# Patient Record
Sex: Female | Born: 1955 | Race: Black or African American | Hispanic: No | Marital: Married | Smoking: Never smoker
Health system: Southern US, Community
[De-identification: ages and names within clinical notes are randomized; demographics above are authoritative.]

## PROBLEM LIST (undated history)

## (undated) DIAGNOSIS — G709 Myoneural disorder, unspecified: Secondary | ICD-10-CM

## (undated) DIAGNOSIS — E785 Hyperlipidemia, unspecified: Secondary | ICD-10-CM

## (undated) DIAGNOSIS — T4145XA Adverse effect of unspecified anesthetic, initial encounter: Secondary | ICD-10-CM

## (undated) DIAGNOSIS — B009 Herpesviral infection, unspecified: Secondary | ICD-10-CM

## (undated) DIAGNOSIS — T8859XA Other complications of anesthesia, initial encounter: Secondary | ICD-10-CM

## (undated) DIAGNOSIS — E559 Vitamin D deficiency, unspecified: Secondary | ICD-10-CM

## (undated) DIAGNOSIS — G4730 Sleep apnea, unspecified: Secondary | ICD-10-CM

## (undated) DIAGNOSIS — E119 Type 2 diabetes mellitus without complications: Secondary | ICD-10-CM

## (undated) DIAGNOSIS — I10 Essential (primary) hypertension: Secondary | ICD-10-CM

## (undated) DIAGNOSIS — T753XXA Motion sickness, initial encounter: Secondary | ICD-10-CM

## (undated) DIAGNOSIS — G459 Transient cerebral ischemic attack, unspecified: Secondary | ICD-10-CM

## (undated) DIAGNOSIS — M1990 Unspecified osteoarthritis, unspecified site: Secondary | ICD-10-CM

## (undated) HISTORY — DX: Hyperlipidemia, unspecified: E78.5

## (undated) HISTORY — PX: KNEE LIGAMENT RECONSTRUCTION: SHX1895

## (undated) HISTORY — DX: Herpesviral infection, unspecified: B00.9

## (undated) HISTORY — DX: Myoneural disorder, unspecified: G70.9

## (undated) HISTORY — DX: Type 2 diabetes mellitus without complications: E11.9

## (undated) HISTORY — DX: Sleep apnea, unspecified: G47.30

## (undated) HISTORY — DX: Essential (primary) hypertension: I10

## (undated) HISTORY — DX: Vitamin D deficiency, unspecified: E55.9

---

## 1988-12-29 DIAGNOSIS — A601 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: Secondary | ICD-10-CM | POA: Insufficient documentation

## 1999-12-30 HISTORY — PX: ABDOMINAL HYSTERECTOMY: SHX81

## 2004-04-02 LAB — HM DEXA SCAN: HM Dexa Scan: NORMAL

## 2005-06-30 ENCOUNTER — Other Ambulatory Visit: Payer: Self-pay

## 2005-07-07 ENCOUNTER — Ambulatory Visit: Payer: Self-pay | Admitting: Specialist

## 2007-12-09 DIAGNOSIS — G4700 Insomnia, unspecified: Secondary | ICD-10-CM | POA: Insufficient documentation

## 2007-12-30 HISTORY — PX: FOOT SURGERY: SHX648

## 2008-01-31 ENCOUNTER — Ambulatory Visit: Payer: Self-pay

## 2008-11-07 ENCOUNTER — Emergency Department: Payer: Self-pay | Admitting: Emergency Medicine

## 2008-11-11 ENCOUNTER — Emergency Department: Payer: Self-pay | Admitting: Unknown Physician Specialty

## 2008-11-11 ENCOUNTER — Emergency Department: Payer: Self-pay | Admitting: Internal Medicine

## 2008-11-13 ENCOUNTER — Ambulatory Visit: Payer: Self-pay | Admitting: Specialist

## 2008-11-15 ENCOUNTER — Ambulatory Visit: Payer: Self-pay | Admitting: Specialist

## 2008-11-16 ENCOUNTER — Inpatient Hospital Stay: Payer: Self-pay | Admitting: Specialist

## 2009-03-13 ENCOUNTER — Ambulatory Visit: Payer: Self-pay

## 2009-03-21 LAB — HM PAP SMEAR: HM Pap smear: NEGATIVE

## 2010-04-26 ENCOUNTER — Ambulatory Visit: Payer: Self-pay | Admitting: Specialist

## 2010-04-29 ENCOUNTER — Ambulatory Visit: Payer: Self-pay

## 2010-05-01 ENCOUNTER — Ambulatory Visit: Payer: Self-pay | Admitting: Specialist

## 2011-06-09 LAB — TSH: TSH: 1.44 u[IU]/mL (ref <=5.90)

## 2011-06-09 LAB — CBC AND DIFFERENTIAL
HEMATOCRIT: 39 % (ref 36–46)
Hemoglobin: 13 g/dL (ref 12.0–16.0)
PLATELETS: 288 10*3/uL (ref 150–399)
WBC: 8.4 10^3/mL

## 2011-07-18 ENCOUNTER — Ambulatory Visit: Payer: Self-pay | Admitting: Family Medicine

## 2012-04-07 ENCOUNTER — Emergency Department: Payer: Self-pay | Admitting: Emergency Medicine

## 2012-04-07 LAB — BASIC METABOLIC PANEL
Calcium, Total: 9.5 mg/dL (ref 8.5–10.1)
Chloride: 104 mmol/L (ref 98–107)
Co2: 27 mmol/L (ref 21–32)
Creatinine: 0.66 mg/dL (ref 0.60–1.30)
EGFR (African American): >60
EGFR (Non-African Amer.): >60
Glucose: 82 mg/dL (ref 65–99)
Potassium: 4.5 mmol/L (ref 3.5–5.1)
Sodium: 140 mmol/L (ref 136–145)

## 2012-04-07 LAB — CBC
HCT: 39.8 % (ref 35.0–47.0)
HGB: 13.3 g/dL (ref 12.0–16.0)
MCV: 90 fL (ref 80–100)

## 2012-04-07 LAB — CK: CK, Total: 175 U/L (ref 21–215)

## 2012-04-07 LAB — TROPONIN I: Troponin-I: <0.02 ng/mL

## 2012-04-07 LAB — PROTIME-INR
INR: 0.9
Prothrombin Time: 12.7 secs (ref 11.5–14.7)

## 2012-05-03 DIAGNOSIS — M216X9 Other acquired deformities of unspecified foot: Secondary | ICD-10-CM | POA: Insufficient documentation

## 2012-05-03 DIAGNOSIS — M129 Arthropathy, unspecified: Secondary | ICD-10-CM | POA: Insufficient documentation

## 2012-05-12 ENCOUNTER — Ambulatory Visit: Payer: Self-pay | Admitting: Orthopaedic Surgery

## 2012-07-06 LAB — HEPATIC FUNCTION PANEL
ALT: 23 U/L (ref 7–35)
AST: 20 U/L (ref 13–35)

## 2012-07-06 LAB — BASIC METABOLIC PANEL
BUN: 15 mg/dL (ref 4–21)
CREATININE: 0.8 mg/dL (ref <=1.1)
Glucose: 91 mg/dL
Potassium: 4.1 mmol/L (ref 3.4–5.3)
SODIUM: 141 mmol/L (ref 137–147)

## 2012-08-12 ENCOUNTER — Ambulatory Visit: Payer: Self-pay | Admitting: Family Medicine

## 2012-12-13 ENCOUNTER — Ambulatory Visit: Payer: Self-pay

## 2012-12-29 ENCOUNTER — Ambulatory Visit: Payer: Self-pay

## 2013-02-04 LAB — LIPID PANEL
Cholesterol: 256 mg/dL — AB (ref 0–200)
HDL: 68 mg/dL (ref 35–70)
LDL CALC: 167 mg/dL
Triglycerides: 103 mg/dL (ref 40–160)

## 2013-02-04 LAB — HEMOGLOBIN A1C: Hgb A1c MFr Bld: 5.8 % (ref 4.0–6.0)

## 2013-09-01 ENCOUNTER — Ambulatory Visit: Payer: Self-pay | Admitting: Family Medicine

## 2013-10-13 ENCOUNTER — Ambulatory Visit: Payer: Self-pay | Admitting: Family Medicine

## 2014-07-25 ENCOUNTER — Emergency Department: Payer: Self-pay | Admitting: Internal Medicine

## 2014-07-25 LAB — URINALYSIS, COMPLETE
BILIRUBIN, UR: NEGATIVE
Blood: NEGATIVE
Glucose,UR: NEGATIVE mg/dL (ref 0–75)
Ketone: NEGATIVE
Leukocyte Esterase: NEGATIVE
Nitrite: NEGATIVE
Ph: 6 (ref 4.5–8.0)
Protein: NEGATIVE
SPECIFIC GRAVITY: 1.002 (ref 1.003–1.030)
WBC UR: <1 /HPF (ref 0–5)

## 2014-07-25 LAB — CBC WITH DIFFERENTIAL/PLATELET
Basophil #: 0 10*3/uL (ref 0.0–0.1)
Basophil %: 0.4 %
Eosinophil #: 0.4 10*3/uL (ref 0.0–0.7)
Eosinophil %: 5.7 %
HCT: 39.3 % (ref 35.0–47.0)
HGB: 12.4 g/dL (ref 12.0–16.0)
LYMPHS ABS: 2.2 10*3/uL (ref 1.0–3.6)
Lymphocyte %: 34 %
MCH: 28.5 pg (ref 26.0–34.0)
MCHC: 31.7 g/dL — AB (ref 32.0–36.0)
MCV: 90 fL (ref 80–100)
MONO ABS: 0.4 x10 3/mm (ref 0.2–0.9)
MONOS PCT: 5.9 %
NEUTROS PCT: 54 %
Neutrophil #: 3.5 10*3/uL (ref 1.4–6.5)
Platelet: 205 10*3/uL (ref 150–440)
RBC: 4.36 10*6/uL (ref 3.80–5.20)
RDW: 14.2 % (ref 11.5–14.5)
WBC: 6.4 10*3/uL (ref 3.6–11.0)

## 2014-07-25 LAB — BASIC METABOLIC PANEL
Anion Gap: 7 (ref 7–16)
BUN: 16 mg/dL (ref 7–18)
CO2: 29 mmol/L (ref 21–32)
CREATININE: 0.59 mg/dL — AB (ref 0.60–1.30)
Calcium, Total: 9.2 mg/dL (ref 8.5–10.1)
Chloride: 104 mmol/L (ref 98–107)
EGFR (African American): >60
Glucose: 98 mg/dL (ref 65–99)
Osmolality: 281 (ref 275–301)
Potassium: 5 mmol/L (ref 3.5–5.1)
Sodium: 140 mmol/L (ref 136–145)

## 2014-07-25 LAB — TROPONIN I: Troponin-I: <0.02 ng/mL

## 2015-02-22 ENCOUNTER — Ambulatory Visit: Payer: Self-pay | Admitting: Family Medicine

## 2015-02-22 LAB — HM MAMMOGRAPHY

## 2015-04-23 DIAGNOSIS — F430 Acute stress reaction: Secondary | ICD-10-CM | POA: Insufficient documentation

## 2015-04-23 DIAGNOSIS — G4700 Insomnia, unspecified: Secondary | ICD-10-CM | POA: Insufficient documentation

## 2015-04-23 DIAGNOSIS — R7303 Prediabetes: Secondary | ICD-10-CM | POA: Insufficient documentation

## 2015-04-23 DIAGNOSIS — J309 Allergic rhinitis, unspecified: Secondary | ICD-10-CM | POA: Insufficient documentation

## 2015-04-23 DIAGNOSIS — E119 Type 2 diabetes mellitus without complications: Secondary | ICD-10-CM | POA: Insufficient documentation

## 2015-06-01 ENCOUNTER — Ambulatory Visit: Payer: Self-pay | Admitting: Family Medicine

## 2015-06-01 ENCOUNTER — Other Ambulatory Visit: Payer: Self-pay | Admitting: Family Medicine

## 2015-06-01 ENCOUNTER — Ambulatory Visit (INDEPENDENT_AMBULATORY_CARE_PROVIDER_SITE_OTHER): Payer: 59 | Admitting: Family Medicine

## 2015-06-01 ENCOUNTER — Encounter: Payer: Self-pay | Admitting: Family Medicine

## 2015-06-01 VITALS — BP 122/86 | HR 70 | Temp 98.1°F | Resp 16 | Ht 65.0 in | Wt 257.0 lb

## 2015-06-01 DIAGNOSIS — R7309 Other abnormal glucose: Secondary | ICD-10-CM | POA: Diagnosis not present

## 2015-06-01 DIAGNOSIS — E669 Obesity, unspecified: Secondary | ICD-10-CM | POA: Diagnosis not present

## 2015-06-01 DIAGNOSIS — R7303 Prediabetes: Secondary | ICD-10-CM

## 2015-06-01 DIAGNOSIS — M79671 Pain in right foot: Secondary | ICD-10-CM

## 2015-06-01 DIAGNOSIS — I10 Essential (primary) hypertension: Secondary | ICD-10-CM | POA: Diagnosis not present

## 2015-06-01 LAB — POCT GLYCOSYLATED HEMOGLOBIN (HGB A1C): Hemoglobin A1C: 6.1

## 2015-06-01 NOTE — Progress Notes (Signed)
Subjective:     Patient ID: Angela Hodges, female   DOB: 1956-07-19, 59 y.o.   MRN: [id number]  HPI  Chief Complaint  Patient presents with  . Obesity    Patient is present today in office for follow up weight check, patient last office visit was 02/01/2015 at visit patient states that  she was doing weekly exercise regimen and was considering joining weight watchers.Patient reports that she was prescribed Phentermine but she believes medication was not helping and she had came more aware that she was eating more when she wanted to rather than when her body was actually hungry.   States she is riding a bike for 30 minutes 5 x week, going to [employer]'s and has resumed Phentermine in the last two weeks. Reports she is concerned about her sugar though home readings have been in the 90's fasting per her report.   Review of Systems     Objective:   Physical Exam  Constitutional: She appears well-developed and well-nourished. No distress.  Cardiovascular: Normal rate and regular rhythm.   Pulmonary/Chest: Breath sounds normal.  Musculoskeletal: She exhibits no edema (in her lower extremities).       Assessment:     1. Essential (primary) hypertension   2. Borderline diabetes  - POCT HgB A1C  3. Adiposity     Plan:     Continue current medication and exercise plan. F/u in 3 months Biometric labs at her work place will be performed in August.

## 2015-06-01 NOTE — Patient Instructions (Addendum)
Continue daily exercise, Weight [employer]'s, and appetite suppressant.-----------------------------------------------------------------------------------------------------------------------------------------

## 2015-06-12 ENCOUNTER — Other Ambulatory Visit: Payer: Self-pay | Admitting: Family Medicine

## 2015-06-12 DIAGNOSIS — E669 Obesity, unspecified: Secondary | ICD-10-CM

## 2015-06-14 ENCOUNTER — Other Ambulatory Visit: Payer: Self-pay | Admitting: Family Medicine

## 2015-06-14 ENCOUNTER — Telehealth: Payer: Self-pay

## 2015-06-14 DIAGNOSIS — E669 Obesity, unspecified: Secondary | ICD-10-CM

## 2015-06-14 NOTE — Telephone Encounter (Signed)
Left message on patients home voicemail informing her that Rx is available for pick up.

## 2015-06-14 NOTE — Telephone Encounter (Signed)
-----   Message from Anola Gurney, [place] sent at 06/14/2015  8:41 AM EDT ----- Let her know her phentermine is here for pickup. The system would not let me send it in.

## 2015-06-29 ENCOUNTER — Ambulatory Visit: Payer: Self-pay | Admitting: Family Medicine

## 2015-08-10 ENCOUNTER — Encounter: Payer: Self-pay | Admitting: Physician Assistant

## 2015-08-17 ENCOUNTER — Encounter: Payer: Self-pay | Admitting: Physician Assistant

## 2015-08-17 ENCOUNTER — Ambulatory Visit (INDEPENDENT_AMBULATORY_CARE_PROVIDER_SITE_OTHER): Payer: 59 | Admitting: Physician Assistant

## 2015-08-17 ENCOUNTER — Other Ambulatory Visit: Payer: Self-pay | Admitting: Physician Assistant

## 2015-08-17 VITALS — BP 122/74 | HR 68 | Temp 98.0°F | Resp 16 | Ht 64.25 in | Wt 267.0 lb

## 2015-08-17 DIAGNOSIS — R7309 Other abnormal glucose: Secondary | ICD-10-CM

## 2015-08-17 DIAGNOSIS — Z1239 Encounter for other screening for malignant neoplasm of breast: Secondary | ICD-10-CM | POA: Diagnosis not present

## 2015-08-17 DIAGNOSIS — Z1211 Encounter for screening for malignant neoplasm of colon: Secondary | ICD-10-CM | POA: Diagnosis not present

## 2015-08-17 DIAGNOSIS — B372 Candidiasis of skin and nail: Secondary | ICD-10-CM

## 2015-08-17 DIAGNOSIS — Z0000 Encounter for general adult medical examination without abnormal findings: Secondary | ICD-10-CM

## 2015-08-17 LAB — IFOBT (OCCULT BLOOD): IFOBT: NEGATIVE

## 2015-08-17 MED ORDER — NYSTATIN 100000 UNIT/GM EX CREA: 1.0000 "application " | TOPICAL_CREAM | Freq: Two times a day (BID) | CUTANEOUS | Status: DC

## 2015-08-17 NOTE — Progress Notes (Signed)
Patient ID: Angela Hodges, female   DOB: [birth year], 59 y.o.   MRN: [id number] Patient: Angela Hodges, Female    DOB: [birth year], 59 y.o.   MRN: [id number] Visit Date: 08/17/2015  Today's Provider: Margaretann Loveless, PA-C   Chief Complaint  Patient presents with  . Annual Exam   Subjective:  Angela Hodges is a 59 y.o. female who presents today for health maintenance and complete physical. She feels well. She reports exercising a little-she is trying to get back to increasing this. She reports she is sleeping well.  LAST: Pap smear-12/14/07-normal. She had hysterectomy total except 1 ovary. Never had abnormal pap smears before.  BMD-04/12/04-normal.  Colonoscopy-was scheduled 2 years ago but due to taking care of her mother and multiple deaths she had to put this on hold.  Eye exam-2015 per patient  Dental exam-06/2015.    Review of Systems  Constitutional: Negative.   HENT: Negative.   Eyes: Negative.   Respiratory: Negative.   Cardiovascular: Negative.   Gastrointestinal: Negative.   Endocrine: Negative.   Genitourinary: Negative.   Musculoskeletal: Positive for back pain and arthralgias.  Skin: Negative.   Allergic/Immunologic: Negative.   Neurological: Negative.   Hematological: Negative.   Psychiatric/Behavioral: Negative.     Social History   Social History  . Marital Status: Married    Spouse Name: N/A  . Number of Children: N/A  . Years of Education: N/A   Occupational History  . Not on file.   Social History Main Topics  . Smoking status: Never Smoker   . Smokeless tobacco: Never Used  . Alcohol Use: No  . Drug Use: No  . Sexual Activity: Not on file   Other Topics Concern  . Not on file   Social History Narrative    Patient Active Problem List   Diagnosis Date Noted  . Allergic rhinitis 04/23/2015  . Cannot sleep 04/23/2015  . Borderline diabetes 04/23/2015  . Acute situational disturbance 04/23/2015  . 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  Acquired equinus deformity of foot 05/03/2012  . Avitaminosis D 04/03/2010  . Hypercholesterolemia without hypertriglyceridemia 04/10/2009  . 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 Adiposity 02/19/2007  . Essential (primary) hypertension 12/29/1998  . 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 Abdominal hysterectomy  2001    has 1 ovary left  . Knee ligament reconstruction Left     torn ACL  . Foot surgery Right 2009    Her family history includes COPD in her brother; Dementia in her father, maternal grandmother, and mother; Diabetes in her brother, brother, father, mother, and sister; Early death (age of onset: 82) in her brother; Fibromyalgia in her sister, sister, and sister; Heart disease in her maternal grandfather; Heart murmur in her sister; Hyperlipidemia in her mother; Hypertension in her mother.    Outpatient Prescriptions Prior to Visit  Medication Sig Dispense Refill  . aspirin 81 MG tablet Take 1 tablet by mouth daily.    . Cholecalciferol 1000 UNITS capsule Take 1 capsule by mouth 3 (three) times daily.    . fluticasone (FLONASE) 50 MCG/ACT nasal spray Place into the nose.    . naproxen (NAPROSYN) 500 MG tablet TAKE ONE TABLET BY MOUTH TWICE A DAY WITH FOOD 28 tablet 1  . Omega-3 Fatty Acids (FISH OIL) 1200 MG CAPS Take by mouth.    . phentermine (ADIPEX-P) 37.5 MG tablet TAKE 1 TABLET BY MOUTH EACH MORNING AS  NEEDED 30 tablet 0  . triamcinolone cream (KENALOG) 0.1 % TRIAMCINOLONE ACETONIDE, 0.1% (External Cream)  1 (one) Cream Cream Apply sparingly to affected area twice daily for 0 days  Quantity: 30;  Refills: 0   Ordered :05-Oct-2014  Roslyn Smiling ;  Started 05-Oct-2014 Active Comments: DX: 691.8    . valsartan-hydrochlorothiazide (DIOVAN-HCT) 160-25 MG per tablet Take by mouth.    . zolpidem (AMBIEN) 10 MG tablet Take by mouth.     No facility-administered medications prior to visit.    Patient  Care Team: Anola Gurney, [place] as PCP - General (Family Medicine)     Objective:   Vitals:  Filed Vitals:   08/17/15 1020  BP: 122/74  Pulse: 68  Temp: 98 F (36.7 C)  Resp: 16  Height: 5' 4.25" (1.632 m)  Weight: 267 lb (121.11 kg)    Physical Exam  Constitutional: She is oriented to person, place, and time. She appears well-developed and well-nourished. No distress.  Morbidly obese  HENT:  Head: Normocephalic and atraumatic.  Right Ear: Hearing, tympanic membrane, external ear and ear canal normal.  Left Ear: Hearing, tympanic membrane, external ear and ear canal normal.  Nose: Nose normal.  Mouth/Throat: Uvula is midline, oropharynx is clear and moist and mucous membranes are normal. No oropharyngeal exudate.  Eyes: Conjunctivae and EOM are normal. Pupils are equal, round, and reactive to light. Right eye exhibits no discharge. Left eye exhibits no discharge. No scleral icterus.  Neck: Normal range of motion. Neck supple. No JVD present. Carotid bruit is not present. No tracheal deviation present. No thyromegaly present.  Cardiovascular: Normal rate, regular rhythm, normal heart sounds and intact distal pulses.  Exam reveals no gallop and no friction rub.   No murmur heard. Pulmonary/Chest: Effort normal and breath sounds normal. No respiratory distress. She has no wheezes. She has no rales. She exhibits no tenderness. Right breast exhibits no inverted nipple, no mass, no nipple discharge, no skin change and no tenderness. Left breast exhibits no inverted nipple, no mass, no nipple discharge, no skin change and no tenderness. Breasts are symmetrical.  Abdominal: Soft. Bowel sounds are normal. She exhibits no distension and no mass. There is no tenderness. There is no rebound and no guarding. Hernia confirmed negative in the right inguinal area and confirmed negative in the left inguinal area.  Genitourinary: Rectum normal and vagina normal. Guaiac negative stool. No breast  swelling, tenderness, discharge or bleeding. Pelvic exam was performed with patient supine. There is no rash, tenderness, lesion or injury on the right labia. There is no rash, tenderness, lesion or injury on the left labia. Right adnexum displays no mass, no tenderness and no fullness. No erythema, tenderness or bleeding in the vagina. No signs of injury around the vagina. No vaginal discharge found.  S/P hysterectomy and left oopherectomy  Musculoskeletal: Normal range of motion. She exhibits no edema or tenderness.  Lymphadenopathy:    She has no cervical adenopathy.       Right: No inguinal adenopathy present.       Left: No inguinal adenopathy present.  Neurological: She is alert and oriented to person, place, and time. She has normal reflexes. No cranial nerve deficit. Coordination normal.  Skin: Skin is warm, dry and intact. Rash (yeast rash under skin folds) noted. She is not diaphoretic.  Actinic keratosis lesions noted diffusely  Psychiatric: She has a normal mood and affect. Her behavior is normal. Judgment and thought content normal.  Vitals reviewed.  Assessment & Plan:  1. Annual physical exam  Will check labs. Follow-up pending lab results. Biometric screening warm filled out for work. She has gained approximately 10 pounds since her last visit. She has been dealing with depression from the loss of her mother in January 2016 and stress from work. She was trying to get on a regular exercise routine but has gradually decreased her exercise. She is going to start riding her recombinant bicycle again and she recently purchased the 21 day 6 portion size program to help her with weight loss. I will follow up with her in approximately 3 months if her labs are stable to evaluate how she is doing with her weight loss. If she is exercising regularly and trying to stick to a healthy diet we may consider adding Belviq at that time. She has tried phentermine without success. She stated  that the phentermine made her more hungry and caused insomnia. - POCT urinalysis dipstick - Lipid panel - Hemoglobin A1c - CBC with Differential - Comprehensive metabolic panel - Nicotine/cotinine metabolites  2. Colon cancer screening  OC-light in office was negative. She was due for a colonoscopy 2 years ago but stated she did not have it done due to some losses in the family. I will refer her to GI for her colonoscopy now. - Ambulatory referral to Gastroenterology - IFOBT POC (occult bld, rslt in office)  3. Breast cancer screening  Normal breast exam today in the office. No family history of breast cancer. - Mammogram Digital Diagnostic Bilateral; Future  4. Yeast dermatitis  she has started to develop yeast infections under her breast tissue as well as under her pannus. She has controlled it with powder in trying to keep the areas dry. She has recurrent infections. I will prescribe her nystatin cream to apply to the area twice daily for 2 weeks when she has bad recurrence. In between infections she may continue conservative therapies with powder in keeping the skin folds dry. - nystatin cream (MYCOSTATIN); Apply 1 application topically 2 (two) times daily.  Dispense: 30 g; Refill: 0

## 2015-08-17 NOTE — Patient Instructions (Signed)
Health Maintenance Adopting a healthy lifestyle and getting preventive care can go a long way to promote health and wellness. Talk with your health care provider about what schedule of regular examinations is right for you. This is a good chance for you to check in with your provider about disease prevention and staying healthy. In between checkups, there are plenty of things you can do on your own. Experts have done a lot of research about which lifestyle changes and preventive measures are most likely to keep you healthy. Ask your health care provider for more information. WEIGHT AND DIET  Eat a healthy diet 1. Be sure to include plenty of vegetables, fruits, low-fat dairy products, and lean protein. 2. Do not eat a lot of foods high in solid fats, added sugars, or salt. 3. Get regular exercise. This is one of the most important things you can do for your health. 1. Most adults should exercise for at least 150 minutes each week. The exercise should increase your heart rate and make you sweat (moderate-intensity exercise). 2. Most adults should also do strengthening exercises at least twice a week. This is in addition to the moderate-intensity exercise.  Maintain a healthy weight 1. Body mass index (BMI) is a measurement that can be used to identify possible weight problems. It estimates body fat based on height and weight. Your health care provider can help determine your BMI and help you achieve or maintain a healthy weight. 2. For females 6 years of age and older:  1. A BMI below 18.5 is considered underweight. 2. A BMI of 18.5 to 24.9 is normal. 3. A BMI of 25 to 29.9 is considered overweight. 4. A BMI of 30 and above is considered obese.  Watch levels of cholesterol and blood lipids 1. You should start having your blood tested for lipids and cholesterol at 57 years of age, then have this test every 5 years. 2. You may need to have your cholesterol levels checked more often if: 1. Your  lipid or cholesterol levels are high. 2. You are older than 57 years of age. 3. You are at high risk for heart disease.  CANCER SCREENING   Lung Cancer 1. Lung cancer screening is recommended for adults 7-87 years old who are at high risk for lung cancer because of a history of smoking. 2. A yearly low-dose CT scan of the lungs is recommended for people who: 1. Currently smoke. 2. Have quit within the past 15 years. 3. Have at least a 30-pack-year history of smoking. A pack year is smoking an average of one pack of cigarettes a day for 1 year. 3. Yearly screening should continue until it has been 15 years since you quit. 4. Yearly screening should stop if you develop a health problem that would prevent you from having lung cancer treatment.  Breast Cancer  Practice breast self-awareness. This means understanding how your breasts normally appear and feel.  It also means doing regular breast self-exams. Let your health care provider know about any changes, no matter how small.  If you are in your 20s or 30s, you should have a clinical breast exam (CBE) by a health care provider every 1-3 years as part of a regular health exam.  If you are 30 or older, have a CBE every year. Also consider having a breast X-Madlock (mammogram) every year.  If you have a family history of breast cancer, talk to your health care provider about genetic screening.  If you are  at high risk for breast cancer, talk to your health care provider about having an MRI and a mammogram every year.  Breast cancer gene (BRCA) assessment is recommended for women who have family members with BRCA-related cancers. BRCA-related cancers include:  Breast.  Ovarian.  Tubal.  Peritoneal cancers.  Results of the assessment will determine the need for genetic counseling and BRCA1 and BRCA2 testing. Cervical Cancer Routine pelvic examinations to screen for cervical cancer are no longer recommended for nonpregnant women who  are considered low risk for cancer of the pelvic organs (ovaries, uterus, and vagina) and who do not have symptoms. A pelvic examination may be necessary if you have symptoms including those associated with pelvic infections. Ask your health care provider if a screening pelvic exam is right for you.   The Pap test is the screening test for cervical cancer for women who are considered at risk.  If you had a hysterectomy for a problem that was not cancer or a condition that could lead to cancer, then you no longer need Pap tests.  If you are older than 65 years, and you have had normal Pap tests for the past 10 years, you no longer need to have Pap tests.  If you have had past treatment for cervical cancer or a condition that could lead to cancer, you need Pap tests and screening for cancer for at least 20 years after your treatment.  If you no longer get a Pap test, assess your risk factors if they change (such as having a new sexual partner). This can affect whether you should start being screened again.  Some women have medical problems that increase their chance of getting cervical cancer. If this is the case for you, your health care provider may recommend more frequent screening and Pap tests.  The human papillomavirus (HPV) test is another test that may be used for cervical cancer screening. The HPV test looks for the virus that can cause cell changes in the cervix. The cells collected during the Pap test can be tested for HPV.  The HPV test can be used to screen women 2 years of age and older. Getting tested for HPV can extend the interval between normal Pap tests from three to five years.  An HPV test also should be used to screen women of any age who have unclear Pap test results.  After 57 years of age, women should have HPV testing as often as Pap tests.  Colorectal Cancer  This type of cancer can be detected and often prevented.  Routine colorectal cancer screening usually  begins at 57 years of age and continues through 57 years of age.  Your health care provider may recommend screening at an earlier age if you have risk factors for colon cancer.  Your health care provider may also recommend using home test kits to check for hidden blood in the stool.  A small camera at the end of a tube can be used to examine your colon directly (sigmoidoscopy or colonoscopy). This is done to check for the earliest forms of colorectal cancer.  Routine screening usually begins at age 57.  Direct examination of the colon should be repeated every 5-10 years through 57 years of age. However, you may need to be screened more often if early forms of precancerous polyps or small growths are found. Skin Cancer  Check your skin from head to toe regularly.  Tell your health care provider about any new moles or changes in  moles, especially if there is a change in a mole's shape or color.  Also tell your health care provider if you have a mole that is larger than the size of a pencil eraser.  Always use sunscreen. Apply sunscreen liberally and repeatedly throughout the day.  Protect yourself by wearing long sleeves, pants, a wide-brimmed hat, and sunglasses whenever you are outside. HEART DISEASE, DIABETES, AND HIGH BLOOD PRESSURE   Have your blood pressure checked at least every 1-2 years. High blood pressure causes heart disease and increases the risk of stroke.  If you are between 32 years and 30 years old, ask your health care provider if you should take aspirin to prevent strokes.  Have regular diabetes screenings. This involves taking a blood sample to check your fasting blood sugar level.  If you are at a normal weight and have a low risk for diabetes, have this test once every three years after 57 years of age.  If you are overweight and have a high risk for diabetes, consider being tested at a younger age or more often. PREVENTING INFECTION  Hepatitis B  If you have a  higher risk for hepatitis B, you should be screened for this virus. You are considered at high risk for hepatitis B if:  You were born in a country where hepatitis B is common. Ask your health care provider which countries are considered high risk.  Your parents were born in a high-risk country, and you have not been immunized against hepatitis B (hepatitis B vaccine).  You have HIV or AIDS.  You use needles to inject street drugs.  You live with someone who has hepatitis B.  You have had sex with someone who has hepatitis B.  You get hemodialysis treatment.  You take certain medicines for conditions, including cancer, organ transplantation, and autoimmune conditions. Hepatitis C  Blood testing is recommended for:  Everyone born from [birth year] through 1963.  Anyone with known risk factors for hepatitis C. Sexually transmitted infections (STIs)  You should be screened for sexually transmitted infections (STIs) including gonorrhea and chlamydia if:  You are sexually active and are younger than 57 years of age.  You are older than 57 years of age and your health care provider tells you that you are at risk for this type of infection.  Your sexual activity has changed since you were last screened and you are at an increased risk for chlamydia or gonorrhea. Ask your health care provider if you are at risk.  If you do not have HIV, but are at risk, it may be recommended that you take a prescription medicine daily to prevent HIV infection. This is called pre-exposure prophylaxis (PrEP). You are considered at risk if:  You are sexually active and do not regularly use condoms or know the HIV status of your partner(s).  You take drugs by injection.  You are sexually active with a partner who has HIV. Talk with your health care provider about whether you are at high risk of being infected with HIV. If you choose to begin PrEP, you should first be tested for HIV. You should then be tested  every 3 months for as long as you are taking PrEP.  PREGNANCY   If you are premenopausal and you may become pregnant, ask your health care provider about preconception counseling.  If you may become pregnant, take 400 to 800 micrograms (mcg) of folic acid every day.  If you want to prevent pregnancy, talk to your  health care provider about birth control (contraception). OSTEOPOROSIS AND MENOPAUSE   Osteoporosis is a disease in which the bones lose minerals and strength with aging. This can result in serious bone fractures. Your risk for osteoporosis can be identified using a bone density scan.  If you are 44 years of age or older, or if you are at risk for osteoporosis and fractures, ask your health care provider if you should be screened.  Ask your health care provider whether you should take a calcium or vitamin D supplement to lower your risk for osteoporosis.  Menopause may have certain physical symptoms and risks.  Hormone replacement therapy may reduce some of these symptoms and risks. Talk to your health care provider about whether hormone replacement therapy is right for you.  HOME CARE INSTRUCTIONS   Schedule regular health, dental, and eye exams.  Stay current with your immunizations.   Do not use any tobacco products including cigarettes, chewing tobacco, or electronic cigarettes.  If you are pregnant, do not drink alcohol.  If you are breastfeeding, limit how much and how often you drink alcohol.  Limit alcohol intake to no more than 1 drink per day for nonpregnant women. One drink equals 12 ounces of beer, 5 ounces of wine, or 1 ounces of hard liquor.  Do not use street drugs.  Do not share needles.  Ask your health care provider for help if you need support or information about quitting drugs.  Tell your health care provider if you often feel depressed.  Tell your health care provider if you have ever been abused or do not feel safe at home. Document  Released: 06/30/2011 Document Revised: 05/01/2014 Document Reviewed: 11/16/2013 [facility] Patient Information 2015 [place], [place]. This information is not intended to replace advice given to you by your health care provider. Make sure you discuss any questions you have with your health care provider.  Colonoscopy A colonoscopy is an exam to look at the entire large intestine (colon). This exam can help find problems such as tumors, polyps, inflammation, and areas of bleeding. The exam takes about 1 hour.  LET [facility] CARE PROVIDER KNOW ABOUT:  4. Any allergies you have. 5. All medicines you are taking, including vitamins, herbs, eye drops, creams, and over-the-counter medicines. 6. Previous problems you or members of your family have had with the use of anesthetics. 7. Any blood disorders you have. 8. Previous surgeries you have had. 9. Medical conditions you have. RISKS AND COMPLICATIONS  Generally, this is a safe procedure. However, as with any procedure, complications can occur. Possible complications include: 3. Bleeding. 4. Tearing or rupture of the colon wall. 5. Reaction to medicines given during the exam. 6. Infection (rare). BEFORE THE PROCEDURE  3. Ask your health care provider about changing or stopping your regular medicines. 4. You may be prescribed an oral bowel prep. This involves drinking a large amount of medicated liquid, starting the day before your procedure. The liquid will cause you to have multiple loose stools until your stool is almost clear or light green. This cleans out your colon in preparation for the procedure. 5. Do not eat or drink anything else once you have started the bowel prep, unless your health care provider tells you it is safe to do so. 6. Arrange for someone to drive you home after the procedure. PROCEDURE  5. You will be given medicine to help you relax (sedative). 6. You will lie on your side with your knees bent. 7. A  long, flexible tube  with a light and camera on the end (colonoscope) will be inserted through the rectum and into the colon. The camera sends video back to a computer screen as it moves through the colon. The colonoscope also releases carbon dioxide gas to inflate the colon. This helps your health care provider see the area better. 8. During the exam, your health care provider may take a small tissue sample (biopsy) to be examined under a microscope if any abnormalities are found. 9. The exam is finished when the entire colon has been viewed. AFTER THE PROCEDURE   Do not drive for 24 hours after the exam.  You may have a small amount of blood in your stool.  You may pass moderate amounts of gas and have mild abdominal cramping or bloating. This is caused by the gas used to inflate your colon during the exam.  Ask when your test results will be ready and how you will get your results. Make sure you get your test results. Document Released: 12/12/2000 Document Revised: 10/05/2013 Document Reviewed: 08/22/2013 [facility] Patient Information 2015 [place], [place]. This information is not intended to replace advice given to you by your health care provider. Make sure you discuss any questions you have with your health care provider.     Why follow it? Research shows. . Those who follow the Mediterranean diet have a reduced risk of heart disease  . The diet is associated with a reduced incidence of Parkinson's and Alzheimer's diseases . People following the diet may have longer life expectancies and lower rates of chronic diseases  . The Dietary Guidelines for Americans recommends the Mediterranean diet as an eating plan to promote health and prevent disease  What Is the Mediterranean Diet?  . Healthy eating plan based on typical foods and recipes of Mediterranean-style cooking . The diet is primarily a plant based diet; these foods should make up a majority of meals   Starches - Plant based foods should make up a  majority of meals - They are an important sources of vitamins, minerals, energy, antioxidants, and fiber - Choose whole grains, foods high in fiber and minimally processed items  - Typical grain sources include wheat, oats, barley, corn, brown rice, bulgar, farro, millet, polenta, couscous  - Various types of beans include chickpeas, lentils, fava beans, black beans, white beans   Fruits  Veggies - Large quantities of antioxidant rich fruits & veggies; 6 or more servings  - Vegetables can be eaten raw or lightly drizzled with oil and cooked  - Vegetables common to the traditional Mediterranean Diet include: artichokes, arugula, beets, broccoli, brussel sprouts, cabbage, carrots, celery, collard greens, cucumbers, eggplant, kale, leeks, lemons, lettuce, mushrooms, okra, onions, peas, peppers, potatoes, pumpkin, radishes, rutabaga, shallots, spinach, sweet potatoes, turnips, zucchini - Fruits common to the Mediterranean Diet include: apples, apricots, avocados, cherries, clementines, dates, figs, grapefruits, grapes, melons, nectarines, oranges, peaches, pears, pomegranates, strawberries, tangerines  Fats - Replace butter and margarine with healthy oils, such as olive oil, canola oil, and tahini  - Limit nuts to no more than a handful a day  - Nuts include walnuts, almonds, pecans, pistachios, pine nuts  - Limit or avoid candied, honey roasted or heavily salted nuts - Olives are central to the [employer] - can be eaten whole or used in a variety of dishes   Meats Protein - Limiting red meat: no more than a few times a month - When eating red meat: choose lean cuts and  keep the portion to the size of deck of cards - Eggs: approx. 0 to 4 times a week  - Fish and lean poultry: at least 2 a week  - Healthy protein sources include, chicken, [place], lean beef, lamb - Increase intake of seafood such as tuna, salmon, trout, mackerel, shrimp, scallops - Avoid or limit high fat processed meats such  as sausage and bacon  Dairy - Include moderate amounts of low fat dairy products  - Focus on healthy dairy such as fat free yogurt, skim milk, low or reduced fat cheese - Limit dairy products higher in fat such as whole or 2% milk, cheese, ice cream  Alcohol - Moderate amounts of red wine is ok  - No more than 5 oz daily for women (all ages) and men older than age 85  - No more than 10 oz of wine daily for men younger than 36  Other - Limit sweets and other desserts  - Use herbs and spices instead of salt to flavor foods  - Herbs and spices common to the traditional Mediterranean Diet include: basil, bay leaves, chives, cloves, cumin, fennel, garlic, lavender, marjoram, mint, oregano, parsley, pepper, rosemary, sage, savory, sumac, tarragon, thyme   It's not just a diet, it's a lifestyle:  . The Mediterranean diet includes lifestyle factors typical of those in the region  . Foods, drinks and meals are best eaten with others and savored . Daily physical activity is important for overall good health . This could be strenuous exercise like running and aerobics . This could also be more leisurely activities such as walking, housework, yard-work, or taking the stairs . Moderation is the key; a balanced and healthy diet accommodates most foods and drinks . Consider portion sizes and frequency of consumption of certain foods   Meal Ideas & Options:  . Breakfast:  o Whole wheat toast or whole wheat English muffins with peanut butter & hard boiled egg o Steel cut oats topped with apples & cinnamon and skim milk  o Fresh fruit: banana, strawberries, melon, berries, peaches  o Smoothies: strawberries, bananas, greek yogurt, peanut butter o Low fat greek yogurt with blueberries and granola  o Egg white omelet with spinach and mushrooms o Breakfast couscous: whole wheat couscous, apricots, skim milk, cranberries  . Sandwiches:  o Hummus and grilled vegetables (peppers, zucchini, squash) on whole  wheat bread   o Grilled chicken on whole wheat pita with lettuce, tomatoes, cucumbers or tzatziki  o Tuna salad on whole wheat bread: tuna salad made with greek yogurt, olives, red peppers, capers, green onions o Garlic rosemary lamb pita: lamb sauted with garlic, rosemary, salt & pepper; add lettuce, cucumber, greek yogurt to pita - flavor with lemon juice and black pepper  . Seafood:  o Mediterranean grilled salmon, seasoned with garlic, basil, parsley, lemon juice and black pepper o Shrimp, lemon, and spinach whole-grain pasta salad made with low fat greek yogurt  o Seared scallops with lemon orzo  o Seared tuna steaks seasoned salt, pepper, coriander topped with tomato mixture of olives, tomatoes, olive oil, minced garlic, parsley, green onions and cappers  . Meats:  o Herbed greek chicken salad with kalamata olives, cucumber, feta  o Red bell peppers stuffed with spinach, bulgur, lean ground beef (or lentils) & topped with feta   o Kebabs: skewers of chicken, tomatoes, onions, zucchini, squash  o [place] burgers: made with red onions, mint, dill, lemon juice, feta cheese topped with roasted red peppers . Vegetarian  o Cucumber salad: cucumbers, artichoke hearts, celery, red onion, feta cheese, tossed in olive oil & lemon juice  o Hummus and whole grain pita points with a greek salad (lettuce, tomato, feta, olives, cucumbers, red onion) o Lentil soup with celery, carrots made with vegetable broth, garlic, salt and pepper  o Tabouli salad: parsley, bulgur, mint, scallions, cucumbers, tomato, radishes, lemon juice, olive oil, salt and pepper.      American Heart Association (AHA) Exercise Recommendation  Being physically active is important to prevent heart disease and stroke, the nation's No. 1and No. 5killers. To improve overall cardiovascular health, we suggest at least 150 minutes per week of moderate exercise or 75 minutes per week of vigorous exercise (or a combination of moderate and  vigorous activity). Thirty minutes a day, five times a week is an easy goal to remember. You will also experience benefits even if you divide your time into two or three segments of 10 to 15 minutes per day.  For people who would benefit from lowering their blood pressure or cholesterol, we recommend 40 minutes of aerobic exercise of moderate to vigorous intensity three to four times a week to lower the risk for heart attack and stroke.  Physical activity is anything that makes you move your body and burn calories.  This includes things like climbing stairs or playing sports. Aerobic exercises benefit your heart, and include walking, jogging, swimming or biking. Strength and stretching exercises are best for overall stamina and flexibility.  The simplest, positive change you can make to effectively improve your heart health is to start walking. It's enjoyable, free, easy, social and great exercise. A walking program is flexible and boasts high success rates because people can stick with it. It's easy for walking to become a regular and satisfying part of life.   For Overall Cardiovascular Health:  At least 30 minutes of moderate-intensity aerobic activity at least 5 days per week for a total of 150  OR   At least 25 minutes of vigorous aerobic activity at least 3 days per week for a total of 75 minutes; or a combination of moderate- and vigorous-intensity aerobic activity  AND   Moderate- to high-intensity muscle-strengthening activity at least 2 days per week for additional health benefits.  For Lowering Blood Pressure and Cholesterol  An average 40 minutes of moderate- to vigorous-intensity aerobic activity 3 or 4 times per week  What if I can't make it to the time goal? Something is always better than nothing! And everyone has to start somewhere. Even if you've been sedentary for years, today is the day you can begin to make healthy changes in your life. If you don't think you'll  make it for 30 or 40 minutes, set a reachable goal for today. You can work up toward your overall goal by increasing your time as you get stronger. Don't let all-or-nothing thinking rob you of doing what you can every day.  Source:http://www.heart.org

## 2015-08-19 ENCOUNTER — Other Ambulatory Visit: Payer: Self-pay | Admitting: Family Medicine

## 2015-08-20 ENCOUNTER — Telehealth: Payer: Self-pay | Admitting: Physician Assistant

## 2015-08-20 NOTE — Telephone Encounter (Signed)
No I am sorry it does not.  Her mammogram was in 01/2015.  I was using the express orders and forgot to [facility] that box.  Thanks!

## 2015-08-20 NOTE — Telephone Encounter (Signed)
There is an order in Epic for diagnostic bilateral mammogram.Unless I missed it in pt's note I did not see there are any new breast problems.Does this need to be ordered ?

## 2015-08-21 ENCOUNTER — Telehealth: Payer: Self-pay

## 2015-08-21 NOTE — Telephone Encounter (Signed)
I have not yet received her results.  As soon as I get them I will let her know.

## 2015-08-21 NOTE — Telephone Encounter (Signed)
Patient is requesting lab results from 8/19.

## 2015-08-21 NOTE — Telephone Encounter (Signed)
Patient advised as directed below. Patient requested if she is unable to answer when calling back with results leave a voicemail on work number which is confidential.

## 2015-08-22 ENCOUNTER — Encounter: Payer: Self-pay | Admitting: Physician Assistant

## 2015-08-22 DIAGNOSIS — R7309 Other abnormal glucose: Secondary | ICD-10-CM | POA: Insufficient documentation

## 2015-08-22 LAB — NICOTINE/COTININE METABOLITES
Cotinine: NOT DETECTED ng/mL
NICOTINE: NOT DETECTED ng/mL

## 2015-08-22 LAB — COMPREHENSIVE METABOLIC PANEL
A/G RATIO: 1.8 (ref 1.1–2.5)
ALT: 19 IU/L (ref 0–32)
AST: 14 IU/L (ref 0–40)
Albumin: 4.4 g/dL (ref 3.5–5.5)
Alkaline Phosphatase: 66 IU/L (ref 39–117)
BUN/Creatinine Ratio: 29 — ABNORMAL HIGH (ref 9–23)
BUN: 23 mg/dL (ref 6–24)
Bilirubin Total: 0.4 mg/dL (ref 0.0–1.2)
CALCIUM: 9.9 mg/dL (ref 8.7–10.2)
CO2: 22 mmol/L (ref 18–29)
Chloride: 99 mmol/L (ref 97–108)
Creatinine, Ser: 0.78 mg/dL (ref 0.57–1.00)
GFR, EST AFRICAN AMERICAN: 98 mL/min/{1.73_m2} (ref >59)
GFR, EST NON AFRICAN AMERICAN: 85 mL/min/{1.73_m2} (ref >59)
Globulin, Total: 2.5 g/dL (ref 1.5–4.5)
Glucose: 97 mg/dL (ref 65–99)
POTASSIUM: 4.6 mmol/L (ref 3.5–5.2)
Sodium: 142 mmol/L (ref 134–144)
TOTAL PROTEIN: 6.9 g/dL (ref 6.0–8.5)

## 2015-08-22 LAB — CBC WITH DIFFERENTIAL/PLATELET
BASOS: 0 %
Basophils Absolute: 0 10*3/uL (ref 0.0–0.2)
EOS (ABSOLUTE): 0.2 10*3/uL (ref 0.0–0.4)
EOS: 2 %
Hematocrit: 40.9 % (ref 34.0–46.6)
Hemoglobin: 13.6 g/dL (ref 11.1–15.9)
IMMATURE GRANS (ABS): 0 10*3/uL (ref 0.0–0.1)
IMMATURE GRANULOCYTES: 0 %
LYMPHS: 29 %
Lymphocytes Absolute: 2.3 10*3/uL (ref 0.7–3.1)
MCH: 28.8 pg (ref 26.6–33.0)
MCHC: 33.3 g/dL (ref 31.5–35.7)
MCV: 87 fL (ref 79–97)
MONOCYTES: 4 %
Monocytes Absolute: 0.3 10*3/uL (ref 0.1–0.9)
NEUTROS PCT: 65 %
Neutrophils Absolute: 5.1 10*3/uL (ref 1.4–7.0)
PLATELETS: 269 10*3/uL (ref 150–379)
RBC: 4.73 x10E6/uL (ref 3.77–5.28)
RDW: 14.4 % (ref 12.3–15.4)
WBC: 8 10*3/uL (ref 3.4–10.8)

## 2015-08-22 LAB — LIPID PANEL
CHOL/HDL RATIO: 2.8 ratio (ref 0.0–4.4)
Cholesterol, Total: 204 mg/dL — ABNORMAL HIGH (ref 100–199)
HDL: 74 mg/dL (ref >39)
LDL CALC: 113 mg/dL — AB (ref 0–99)
Triglycerides: 86 mg/dL (ref 0–149)
VLDL CHOLESTEROL CAL: 17 mg/dL (ref 5–40)

## 2015-08-22 LAB — HEMOGLOBIN A1C
Est. average glucose Bld gHb Est-mCnc: 143 mg/dL
Hgb A1c MFr Bld: 6.6 % — ABNORMAL HIGH (ref 4.8–5.6)

## 2015-08-22 NOTE — Assessment & Plan Note (Signed)
HgB A1c was 6.6 on 08/22/15.  Will refer to Lifestyle center at [facility] for education on lifestyle changes and nutrition counseling.  Will recheck in 6 months.

## 2015-08-22 NOTE — Addendum Note (Signed)
Addended by: Margaretann Loveless on: 08/22/2015 04:53 PM   Modules accepted: Orders

## 2015-08-23 ENCOUNTER — Telehealth: Payer: Self-pay

## 2015-08-23 NOTE — Telephone Encounter (Deleted)
-----   Message from Jennifer M Burnette, PA-C sent at 08/22/2015  4:50 PM EDT ----- Labs are WNL with exception of cholesterol which is slightly elevated, but HDL is high at 74 which is cardioprotective.  HgBA1c is also elevated at 6.6 indicating possible diabetes.  I will refer you to the lifestyle center at [facility] for information and nutrition counseling.  We will recheck in 6 months.  If still elevated may require blood sugar lowering agent.  Also biometric form for work is completed and available for pick up at the front desk with a copy of recent labs.  Thanks. 

## 2015-08-23 NOTE — Telephone Encounter (Signed)
-----   Message from Margaretann Loveless, [place] sent at 08/22/2015  4:50 PM EDT ----- Labs are WNL with exception of cholesterol which is slightly elevated, but HDL is high at 74 which is cardioprotective.  HgBA1c is also elevated at 6.6 indicating possible diabetes.  I will refer you to the lifestyle center at [facility] for information and nutrition counseling.  We will recheck in 6 months.  If still elevated may require blood sugar lowering agent.  Also biometric form for work is completed and available for pick up at the front desk with a copy of recent labs.  Thanks.

## 2015-08-23 NOTE — Telephone Encounter (Signed)
Left message to call back.  Thanks, 

## 2015-08-23 NOTE — Telephone Encounter (Signed)
Advised pt as directed below, pt verbalized fully understanding.  Gave pt telephone # [phone] from [facility] nutrition counseling  class.  Thanks,

## 2015-08-27 ENCOUNTER — Telehealth: Payer: Self-pay

## 2015-08-27 NOTE — Telephone Encounter (Signed)
Left voicemail for patient to call back. 

## 2015-08-27 NOTE — Telephone Encounter (Signed)
Pt concern regarding having diabetes, pt was advised as previous message was directed regarding results, pt stated that she will make some life style changes and that her results will be better. Pt stated that she does have an appointment on November. And that she will also follow-up in 6 months.  Boneta Lucks if you have a chance to give the pt a call, I think it will be beneficial for her, since she was concern and worried.  Thanks,

## 2015-08-28 NOTE — Telephone Encounter (Signed)
Please call her [phone].  Thanks [employer]

## 2015-08-29 ENCOUNTER — Ambulatory Visit (INDEPENDENT_AMBULATORY_CARE_PROVIDER_SITE_OTHER): Payer: 59 | Admitting: Family Medicine

## 2015-08-29 ENCOUNTER — Encounter: Payer: Self-pay | Admitting: Family Medicine

## 2015-08-29 ENCOUNTER — Ambulatory Visit: Payer: 59 | Admitting: Family Medicine

## 2015-08-29 VITALS — BP 124/76 | HR 66 | Temp 97.8°F | Resp 16 | Wt 267.0 lb

## 2015-08-29 DIAGNOSIS — F4321 Adjustment disorder with depressed mood: Secondary | ICD-10-CM

## 2015-08-29 DIAGNOSIS — G43009 Migraine without aura, not intractable, without status migrainosus: Secondary | ICD-10-CM

## 2015-08-29 MED ORDER — SERTRALINE HCL 50 MG PO TABS: 50.0000 mg | ORAL_TABLET | Freq: Every day | ORAL | Status: DC

## 2015-08-29 MED ORDER — KETOROLAC TROMETHAMINE 60 MG/2ML IM SOLN
60.0000 mg | Freq: Once | INTRAMUSCULAR | Status: AC
  Administered 2015-08-29: 60 mg via INTRAMUSCULAR

## 2015-08-29 NOTE — Progress Notes (Signed)
Subjective:     Patient ID: Angela Hodges, female   DOB: March 25, 1956, 59 y.o.   MRN: [id number]  HPI  Chief Complaint  Patient presents with  . Headache    Patient comes in office today with concerns of headache for the past 3 days. Patient states that pain initially was in the back of her head and is now having pain around her entire head. Patient states that she has had right ear pain associated with decreased hearing but states that it is better today. Patient has tried taking Naproxen for relief.  States it is like migraine headaches she has had in the past. When asked about possible triggers she becomes tearful-states she has been worried about her sugar and has been dealing with the death of 4 family members including her mother since January of this year: "I feel depressed".   Review of Systems     Objective:   Physical Exam  Constitutional: She appears well-developed and well-nourished. She appears distressed (tearful ).  Eyes: EOM are normal. Pupils are equal, round, and reactive to light.  Musculoskeletal:  Grip strength 5/5       Assessment:    1. Migraine without aura and without status migrainosus, not intractable - ketorolac (TORADOL) injection 60 mg; Inject 2 mLs (60 mg total) into the muscle once.  2. Adjustment disorder with depressed mood - sertraline (ZOLOFT) 50 MG tablet; Take 1 tablet (50 mg total) by mouth daily. Start at 1/2 pill for the first 5 days.  Dispense: 30 tablet; Refill: 0    Plan:    return in 2 weeks or sooner as needed.

## 2015-08-30 ENCOUNTER — Other Ambulatory Visit: Payer: Self-pay | Admitting: Family Medicine

## 2015-08-30 NOTE — Telephone Encounter (Signed)
Spoke with patient and answered all questions.

## 2015-09-07 ENCOUNTER — Ambulatory Visit: Payer: 59 | Admitting: Family Medicine

## 2015-09-13 ENCOUNTER — Other Ambulatory Visit: Payer: Self-pay

## 2015-09-13 ENCOUNTER — Telehealth: Payer: Self-pay

## 2015-09-13 ENCOUNTER — Ambulatory Visit: Payer: 59 | Admitting: Family Medicine

## 2015-09-13 NOTE — Telephone Encounter (Signed)
Gastroenterology Pre-Procedure Review  Request Date: 12/28/15 Requesting Physician: Anola Gurney, PA  PATIENT REVIEW QUESTIONS: The patient responded to the following health history questions as indicated:    1. Are you having any GI issues? no 2. Do you have a personal history of Polyps? no 3. Do you have a family history of Colon Cancer or Polyps? yes (Brother ) 4. Diabetes Mellitus? no 5. Joint replacements in the past 12 months?no 6. Major health problems in the past 3 months?no 7. Any artificial heart valves, MVP, or defibrillator?no    MEDICATIONS & ALLERGIES:    Patient reports the following regarding taking any anticoagulation/antiplatelet therapy:   Plavix, Coumadin, Eliquis, Xarelto, Lovenox, Pradaxa, Brilinta, or Effient? no Aspirin? yes (ASA )  Patient confirms/reports the following medications:  Current Outpatient Prescriptions  Medication Sig Dispense Refill  . aspirin 81 MG tablet Take 1 tablet by mouth daily.    . Cholecalciferol 1000 UNITS capsule Take 1 capsule by mouth 3 (three) times daily.    . fluticasone (FLONASE) 50 MCG/ACT nasal spray Place into the nose.    . meloxicam (MOBIC) 15 MG tablet TAKE 1 TABLET DAILY AFTER A MEAL  3  . naproxen (NAPROSYN) 500 MG tablet TAKE ONE TABLET BY MOUTH TWICE A DAY WITH FOOD 28 tablet 1  . nystatin cream (MYCOSTATIN) Apply 1 application topically 2 (two) times daily. 30 g 0  . Omega-3 Fatty Acids (FISH OIL) 1200 MG CAPS Take by mouth.    . phentermine (ADIPEX-P) 37.5 MG tablet TAKE 1 TABLET BY MOUTH EACH MORNING AS NEEDED 30 tablet 0  . sertraline (ZOLOFT) 50 MG tablet Take 1 tablet (50 mg total) by mouth daily. Start at 1/2 pill for the first 5 days. 30 tablet 0  . triamcinolone cream (KENALOG) 0.1 % TRIAMCINOLONE ACETONIDE, 0.1% (External Cream)  1 (one) Cream Cream Apply sparingly to affected area twice daily for 0 days  Quantity: 30;  Refills: 0   Ordered :05-Oct-2014  Roslyn Smiling ;  Started 05-Oct-2014 Active  Comments: DX: 691.8    . valsartan-hydrochlorothiazide (DIOVAN-HCT) 160-25 MG per tablet TAKE 1 TABLET BY MOUTH EACH DAY 30 tablet 0  . zolpidem (AMBIEN) 10 MG tablet Take by mouth.     No current facility-administered medications for this visit.    Patient confirms/reports the following allergies:  Allergies  Allergen Reactions  . Penicillins   . Ranitidine Hcl Hives  . Sulfa Antibiotics   . Tramadol Hcl Hives    No orders of the defined types were placed in this encounter.    AUTHORIZATION INFORMATION Primary Insurance: 1D#: Group #:  Secondary Insurance: 1D#: Group #:  SCHEDULE INFORMATION: Date: 12/28/15 Time: Location: [facility]

## 2015-09-21 ENCOUNTER — Encounter: Payer: Self-pay | Admitting: Family Medicine

## 2015-10-03 ENCOUNTER — Other Ambulatory Visit: Payer: Self-pay

## 2015-11-08 ENCOUNTER — Other Ambulatory Visit: Payer: Self-pay | Admitting: *Deleted

## 2015-11-23 ENCOUNTER — Ambulatory Visit: Payer: 59 | Admitting: Physician Assistant

## 2015-12-07 ENCOUNTER — Other Ambulatory Visit: Payer: Self-pay | Admitting: Family Medicine

## 2015-12-07 ENCOUNTER — Telehealth: Payer: Self-pay | Admitting: Family Medicine

## 2015-12-07 ENCOUNTER — Ambulatory Visit (INDEPENDENT_AMBULATORY_CARE_PROVIDER_SITE_OTHER): Payer: 59 | Admitting: Physician Assistant

## 2015-12-07 ENCOUNTER — Encounter: Payer: Self-pay | Admitting: Physician Assistant

## 2015-12-07 VITALS — BP 120/70 | HR 78 | Temp 98.2°F | Resp 16 | Wt 265.8 lb

## 2015-12-07 DIAGNOSIS — A601 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: Secondary | ICD-10-CM | POA: Diagnosis not present

## 2015-12-07 DIAGNOSIS — R7303 Prediabetes: Secondary | ICD-10-CM | POA: Diagnosis not present

## 2015-12-07 DIAGNOSIS — Z6841 Body Mass Index (BMI) 40.0 and over, adult: Secondary | ICD-10-CM | POA: Diagnosis not present

## 2015-12-07 DIAGNOSIS — Z713 Dietary counseling and surveillance: Secondary | ICD-10-CM | POA: Diagnosis not present

## 2015-12-07 DIAGNOSIS — G4700 Insomnia, unspecified: Secondary | ICD-10-CM

## 2015-12-07 LAB — POCT GLYCOSYLATED HEMOGLOBIN (HGB A1C)
ESTIMATED AVERAGE GLUCOSE: 128
HEMOGLOBIN A1C: 6.1

## 2015-12-07 MED ORDER — ZOLPIDEM TARTRATE 10 MG PO TABS: ORAL_TABLET | ORAL | Status: DC

## 2015-12-07 MED ORDER — LORCASERIN HCL 10 MG PO TABS: 10.0000 mg | ORAL_TABLET | ORAL | Status: DC

## 2015-12-07 MED ORDER — VALACYCLOVIR HCL 1 G PO TABS: 1000.0000 mg | ORAL_TABLET | Freq: Every day | ORAL | Status: DC

## 2015-12-07 NOTE — Patient Instructions (Signed)
Calorie Counting for Weight Loss Calories are energy you get from the things you eat and drink. Your body uses this energy to keep you going throughout the day. The number of calories you eat affects your weight. When you eat more calories than your body needs, your body stores the extra calories as fat. When you eat fewer calories than your body needs, your body burns fat to get the energy it needs. Calorie counting means keeping track of how many calories you eat and drink each day. If you make sure to eat fewer calories than your body needs, you should lose weight. In order for calorie counting to work, you will need to eat the number of calories that are right for you in a day to lose a healthy amount of weight per week. A healthy amount of weight to lose per week is usually 1-2 lb (0.5-0.9 kg). A dietitian can determine how many calories you need in a day and give you suggestions on how to reach your calorie goal.  WHAT IS MY MY PLAN? My goal is to have 1200 calories per day.  If I have this many calories per day, I should lose around 1-2 pounds per week. WHAT DO I NEED TO KNOW ABOUT CALORIE COUNTING? In order to meet your daily calorie goal, you will need to:  Find out how many calories are in each food you would like to eat. Try to do this before you eat.  Decide how much of the food you can eat.  Write down what you ate and how many calories it had. Doing this is called keeping a food log. WHERE DO I FIND CALORIE INFORMATION? The number of calories in a food can be found on a Nutrition Facts label. Note that all the information on a label is based on a specific serving of the food. If a food does not have a Nutrition Facts label, try to look up the calories online or ask your dietitian for help. HOW DO I DECIDE HOW MUCH TO EAT? To decide how much of the food you can eat, you will need to consider both the number of calories in one serving and the size of one serving. This information can be  found on the Nutrition Facts label. If a food does not have a Nutrition Facts label, look up the information online or ask your dietitian for help. Remember that calories are listed per serving. If you choose to have more than one serving of a food, you will have to multiply the calories per serving by the amount of servings you plan to eat. For example, the label on a package of bread might say that a serving size is 1 slice and that there are 90 calories in a serving. If you eat 1 slice, you will have eaten 90 calories. If you eat 2 slices, you will have eaten 180 calories. HOW DO I KEEP A FOOD LOG? After each meal, record the following information in your food log:  What you ate.  How much of it you ate.  How many calories it had.  Then, add up your calories. Keep your food log near you, such as in a small notebook in your pocket. Another option is to use a mobile app or website. Some programs will calculate calories for you and show you how many calories you have left each time you add an item to the log. WHAT ARE SOME CALORIE COUNTING TIPS?  Use your calories on foods   and drinks that will fill you up and not leave you hungry. Some examples of this include foods like nuts and nut butters, vegetables, lean proteins, and high-fiber foods (more than 5 g fiber per serving).  Eat nutritious foods and avoid empty calories. Empty calories are calories you get from foods or beverages that do not have many nutrients, such as candy and soda. It is better to have a nutritious high-calorie food (such as an avocado) than a food with few nutrients (such as a bag of chips).  Know how many calories are in the foods you eat most often. This way, you do not have to look up how many calories they have each time you eat them.  Look out for foods that may seem like low-calorie foods but are really high-calorie foods, such as baked goods, soda, and fat-free candy.  Pay attention to calories in drinks. Drinks  such as sodas, specialty coffee drinks, alcohol, and juices have a lot of calories yet do not fill you up. Choose low-calorie drinks like water and diet drinks.  Focus your calorie counting efforts on higher calorie items. Logging the calories in a garden salad that contains only vegetables is less important than calculating the calories in a milk shake.  Find a way of tracking calories that works for you. Get creative. Most people who are successful find ways to keep track of how much they eat in a day, even if they do not count every calorie. WHAT ARE SOME PORTION CONTROL TIPS?  Know how many calories are in a serving. This will help you know how many servings of a certain food you can have.  Use a measuring cup to measure serving sizes. This is helpful when you start out. With time, you will be able to estimate serving sizes for some foods.  Take some time to put servings of different foods on your favorite plates, bowls, and cups so you know what a serving looks like.  Try not to eat straight from a bag or box. Doing this can lead to overeating. Put the amount you would like to eat in a cup or on a plate to make sure you are eating the right portion.  Use smaller plates, glasses, and bowls to prevent overeating. This is a quick and easy way to practice portion control. If your plate is smaller, less food can fit on it.  Try not to multitask while eating, such as watching TV or using your computer. If it is time to eat, sit down at a table and enjoy your food. Doing this will help you to start recognizing when you are full. It will also make you more aware of what and how much you are eating. HOW CAN I CALORIE COUNT WHEN EATING OUT?  Ask for smaller portion sizes or child-sized portions.  Consider sharing an entree and sides instead of getting your own entree.  If you get your own entree, eat only half. Ask for a box at the beginning of your meal and put the rest of your entree in it so  you are not tempted to eat it.  Look for the calories on the menu. If calories are listed, choose the lower calorie options.  Choose dishes that include vegetables, fruits, whole grains, low-fat dairy products, and lean protein. Focusing on smart food choices from each of the 5 food groups can help you stay on track at restaurants.  Choose items that are boiled, broiled, grilled, or steamed.  Choose   water, milk, unsweetened iced tea, or other drinks without added sugars. If you want an alcoholic beverage, choose a lower calorie option. For example, a regular margarita can have up to 700 calories and a glass of wine has around 150.  Stay away from items that are buttered, battered, fried, or served with cream sauce. Items labeled "crispy" are usually fried, unless stated otherwise.  Ask for dressings, sauces, and syrups on the side. These are usually very high in calories, so do not eat much of them.  Watch out for salads. Many people think salads are a healthy option, but this is often not the case. Many salads come with bacon, fried chicken, lots of cheese, fried chips, and dressing. All of these items have a lot of calories. If you want a salad, choose a garden salad and ask for grilled meats or steak. Ask for the dressing on the side, or ask for olive oil and vinegar or lemon to use as dressing.  Estimate how many servings of a food you are given. For example, a serving of cooked rice is  cup or about the size of half a tennis ball or one cupcake wrapper. Knowing serving sizes will help you be aware of how much food you are eating at restaurants. The list below tells you how big or small some common portion sizes are based on everyday objects.  1 oz--4 stacked dice.  3 oz--1 deck of cards.  1 tsp--1 dice.  1 Tbsp-- a Ping-Pong ball.  2 Tbsp--1 Ping-Pong ball.   cup--1 tennis ball or 1 cupcake wrapper.  1 cup--1 baseball.   This information is not intended to replace advice given  to you by your health care provider. Make sure you discuss any questions you have with your health care provider.   Document Released: 12/15/2005 Document Revised: 01/05/2015 Document Reviewed: 10/20/2013 Elsevier Interactive Patient Education 2016 Elsevier Inc. Exercising to Lose Weight Exercising can help you to lose weight. In order to lose weight through exercise, you need to do vigorous-intensity exercise. You can tell that you are exercising with vigorous intensity if you are breathing very hard and fast and cannot hold a conversation while exercising. Moderate-intensity exercise helps to maintain your current weight. You can tell that you are exercising at a moderate level if you have a higher heart rate and faster breathing, but you are still able to hold a conversation. HOW OFTEN SHOULD I EXERCISE? Choose an activity that you enjoy and set realistic goals. Your health care provider can help you to make an activity plan that works for you. Exercise regularly as directed by your health care provider. This may include:  Doing resistance training twice each week, such as:  Push-ups.  Sit-ups.  Lifting weights.  Using resistance bands.  Doing a given intensity of exercise for a given amount of time. Choose from these options:  150 minutes of moderate-intensity exercise every week.  75 minutes of vigorous-intensity exercise every week.  A mix of moderate-intensity and vigorous-intensity exercise every week. Children, pregnant women, people who are out of shape, people who are overweight, and older adults may need to consult a health care provider for individual recommendations. If you have any sort of medical condition, be sure to consult your health care provider before starting a new exercise program. WHAT ARE SOME ACTIVITIES THAT CAN HELP ME TO LOSE WEIGHT?   Walking at a rate of at least 4.5 miles an hour.  Jogging or running at a rate of   5 miles per hour.  Biking at a rate  of at least 10 miles per hour.  Lap swimming.  Roller-skating or in-line skating.  Cross-country skiing.  Vigorous competitive sports, such as football, basketball, and soccer.  Jumping rope.  Aerobic dancing. HOW CAN I BE MORE ACTIVE IN MY DAY-TO-DAY ACTIVITIES?  Use the stairs instead of the elevator.  Take a walk during your lunch break.  If you drive, park your car farther away from work or school.  If you take public transportation, get off one stop early and walk the rest of the way.  Make all of your phone calls while standing up and walking around.  Get up, stretch, and walk around every 30 minutes throughout the day. WHAT GUIDELINES SHOULD I FOLLOW WHILE EXERCISING?  Do not exercise so much that you hurt yourself, feel dizzy, or get very short of breath.  Consult your health care provider prior to starting a new exercise program.  Wear comfortable clothes and shoes with good support.  Drink plenty of water while you exercise to prevent dehydration or heat stroke. Body water is lost during exercise and must be replaced.  Work out until you breathe faster and your heart beats faster.   This information is not intended to replace advice given to you by your health care provider. Make sure you discuss any questions you have with your health care provider.   Document Released: 01/17/2011 Document Revised: 01/05/2015 Document Reviewed: 05/18/2014 Elsevier Interactive Patient Education 2016 Elsevier Inc.  

## 2015-12-07 NOTE — Telephone Encounter (Signed)
Please review. Thanks!  

## 2015-12-07 NOTE — Telephone Encounter (Signed)
Pt stated that the pharmacy told her she couldn't use the discount card for the Lorcaserin HCl 10 MG TABS unless it was for more pills. Pt wasn't sure if she could have an RX for a 60 day supply. Pt stated that she didn't have the RX filled. Pharmacy [facility]. Please advise. Thanks TNP

## 2015-12-07 NOTE — Progress Notes (Signed)
Patient: Angela Hodges Female    DOB: 1956/03/15   59 y.o.   MRN: [id number] Visit Date: 12/07/2015  Today's Provider: Margaretann Loveless, PA-C   Chief Complaint  Patient presents with  . Follow-up    Weight  . Rash   Subjective:    HPI Weight: Patient is here for her 3 month follow-up weight. Per patient is exercising on her recumbent bike at home.   Current Exercise Habits none in 2 months  Current Eating Habits Number of regular meals per day: 2 Number of snacking episodes per day: 2 apples and crackers. Who shops for food? Patient and husband Who prepares food? Patient and husband Who eats with patient? Patient and husband Other Potential Contributing Factors Use of alcohol: average 0 drinks/week  Rash: Per patient has a Herpes outbreak on her perianal and is bothering her. Noticed on Saturday. Per patient took Valtrex years ago. It has been 10-15 year that patient has had a outbreak. Would like a prescription. Feels she has an outbreak due to increased stress. She states that she has been very emotional and stressed out recently with the holidays coming and this being her first year without her mother. She is also coming up on the one-year anniversary of her mother's passing in January.     Allergies  Allergen Reactions  . Penicillins   . Ranitidine Hcl Hives  . Sulfa Antibiotics   . Tramadol Hcl Hives   Previous Medications   ASPIRIN 81 MG TABLET    Take 1 tablet by mouth daily.   CHOLECALCIFEROL 1000 UNITS CAPSULE    Take 1 capsule by mouth 3 (three) times daily.   FLUTICASONE (FLONASE) 50 MCG/ACT NASAL SPRAY    Place into the nose.   MELOXICAM (MOBIC) 15 MG TABLET    TAKE 1 TABLET DAILY AFTER A MEAL   NAPROXEN (NAPROSYN) 500 MG TABLET    TAKE ONE TABLET BY MOUTH TWICE A DAY WITH FOOD   NYSTATIN CREAM (MYCOSTATIN)    Apply 1 application topically 2 (two) times daily.   OMEGA-3 FATTY ACIDS (FISH OIL) 1200 MG CAPS    Take by mouth.   SERTRALINE  (ZOLOFT) 50 MG TABLET    Take 1 tablet (50 mg total) by mouth daily. Start at 1/2 pill for the first 5 days.   TRIAMCINOLONE CREAM (KENALOG) 0.1 %    TRIAMCINOLONE ACETONIDE, 0.1% (External Cream)  1 (one) Cream Cream Apply sparingly to affected area twice daily for 0 days  Quantity: 30;  Refills: 0   Ordered :05-Oct-2014  Roslyn Smiling ;  Started 05-Oct-2014 Active Comments: DX: 691.8   VALSARTAN-HYDROCHLOROTHIAZIDE (DIOVAN-HCT) 160-25 MG PER TABLET    TAKE 1 TABLET BY MOUTH EACH DAY   ZOLPIDEM (AMBIEN) 10 MG TABLET    1/2 to one at bedtime as needed for insomnia    Review of Systems  Constitutional: Negative.   HENT: Negative.   Eyes: Negative.   Respiratory: Negative.   Cardiovascular: Negative.   Gastrointestinal: Negative.   Endocrine: Negative.   Genitourinary: Negative.   Musculoskeletal: Negative.   Skin: Negative.   Allergic/Immunologic: Negative.   Neurological: Negative.   Hematological: Negative.   Psychiatric/Behavioral: The patient is nervous/anxious (nervous about having hemoglobin A1c checked today).     Social History  Substance Use Topics  . Smoking status: Never Smoker   . Smokeless tobacco: Never Used  . Alcohol Use: No   Objective:   BP 120/70 mmHg  Pulse  78  Temp(Src) 98.2 F (36.8 C) (Oral)  Resp 16  Wt 265 lb 12.8 oz (120.566 kg)  Physical Exam  Constitutional: She appears well-developed and well-nourished. No distress.  Cardiovascular: Normal rate, regular rhythm and normal heart sounds.  Exam reveals no gallop and no friction rub.   No murmur heard. Pulmonary/Chest: Effort normal and breath sounds normal. No respiratory distress. She has no wheezes. She has no rales.  Skin: She is not diaphoretic.  Psychiatric: Her speech is normal and behavior is normal. Judgment and thought content normal. Her mood appears anxious. Cognition and memory are normal. She exhibits a depressed mood (Still somewhat depressed but better than previously).  Vitals  reviewed.       Assessment & Plan:     1. Encounter for weight loss counseling She is going to try a food diary and limit herself to 1200 cal daily. She is going to try my fitness Pal as we discussed in the office visit today. I also gave her a handout and diet plan for carbohydrate monitoring as well as how to follow a 1200-calorie diet. She is to continue her physical activity as she has been doing. I advised her to try to increase exercising to a total of 3-4 days per week for 30-40 minutes. She has been dieting and watching her carbohydrates due to having an increased hemoglobin A1c reading in August of 6.6. She got a low weight of 259 from 267 but has since put on a few pounds again weighing 265 today in the office. She feels that she was doing well but went Thanksgiving hit she slacked off due to depressed feelings thinking about the holidays without her mother and also coming up on the one-year anniversary of her mother's passing. We did check her hemoglobin A1c today in the office and it has decreased to 6.1 area this has encouraged her to continue with her diet as she was doing and to start exercising again. She seemed very excited to start her diet plan when leaving the office visit today. She also has tried phentermine in the past. She states that phentermine made her more hungry and also cause insomnia. She is interested in trying Belviq today. I will prescribe Belviq and try to obtain prior authorization from her insurance company. She is to start this medication once this is completed. I will see her back in 6 weeks to see how she is tolerating the medication and to see how she is doing with her weight loss.  2. Morbid obesity due to excess calories [facility]) See above medical treatment plan. - Lorcaserin HCl 10 MG TABS; Take 10 mg by mouth every morning.  Dispense: 30 tablet; Refill: 0  3. BMI 45.0-49.9, adult (HCC) See above medical treatment plan Lorcaserin HCl 10 MG TABS; Take 10 mg by  mouth every morning.  Dispense: 30 tablet; Refill: 0  4. 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 She has successfully treated her outbreaks in the past with Valtrex. I will refill Valtrex as below. She is to call the office if symptoms fail to improve or worsen. - valACYclovir (VALTREX) 1000 MG tablet; Take 1 tablet (1,000 mg total) by mouth daily.  Dispense: 5 tablet; Refill: 0  5. Borderline diabetes She has done well with limiting carbohydrates and sugars in her diet. Previous hemoglobin A1c in August was 6.6. Today she has brought her hemoglobin A1c down  to 6.1. She is to continue limiting carbohydrates and sugars. I did give her a booklet on the guide to eating carbohydrates with diabetes. She is going to continue with these lifestyle modifications and we will recheck her hemoglobin A1c in 6 months. - POCT HgB A1C       Margaretann Loveless, PA-C  [facility]

## 2015-12-07 NOTE — Telephone Encounter (Signed)
Changed dosage.  New Rx is upfront for pick up.  Tell her to bring the old Rx and we can shred it.  Thanks.

## 2015-12-10 ENCOUNTER — Telehealth: Payer: Self-pay | Admitting: Family Medicine

## 2015-12-10 NOTE — Telephone Encounter (Signed)
LMTCB  Thanks,  -Joseline 

## 2015-12-10 NOTE — Telephone Encounter (Signed)
LMTCB.  Re: Medication. Per the Pharmacist the Qty: 60 is covered by the coupon and patient will have to pay the difference which is 87 dollars. With the primary insurance is 47 dollars out of pocket and it only covers Qty of 30. Need to verify with patient which one she wants.  Thanks,  -Joseline

## 2015-12-10 NOTE — Telephone Encounter (Signed)
the coupon card the patient is trying to use is for 60 and her Prescription is for 30.  We told the patient we would write another rx but she can not come get it.  The pharmacy wants to know if we will call in the new prescription for her.    Their call back [phone].  Thanks Barth Kirkseri

## 2015-12-11 NOTE — Telephone Encounter (Signed)
LMTCB to explain about the coupon and her primary insurance coverage. The pharmacist stated they were going to leave it as Qty of 30 since it was cheaper than with the coupon. See other notes of telephone calls previously done.  Thanks,  -Willadean Guyton

## 2015-12-17 ENCOUNTER — Other Ambulatory Visit: Payer: Self-pay | Admitting: Family Medicine

## 2015-12-17 DIAGNOSIS — I10 Essential (primary) hypertension: Secondary | ICD-10-CM

## 2015-12-17 NOTE — Telephone Encounter (Signed)
See refill request. I think she is switching to you.

## 2015-12-20 ENCOUNTER — Encounter: Payer: Self-pay | Admitting: *Deleted

## 2015-12-26 ENCOUNTER — Telehealth: Payer: Self-pay | Admitting: Gastroenterology

## 2015-12-26 NOTE — Telephone Encounter (Signed)
Patient is scheduled for a colonoscopy Friday and not feeling well. She would like for you to call her and discuss if she should have the colonoscopy.

## 2015-12-26 NOTE — Telephone Encounter (Signed)
Spoke with pt and she feels like she has developed a virus. She has been having low grade fevers at night but feels fine during the day. Told pt to wait this out and if she is still feeling bad by lunch tomorrow to call me and we will reschedule the colonoscopy.

## 2015-12-28 ENCOUNTER — Encounter
Admission: RE | Disposition: A | Discharge status: Home / Self Care | Payer: Self-pay | Source: Ambulatory Visit | Attending: Gastroenterology

## 2015-12-28 ENCOUNTER — Ambulatory Visit: Payer: 59 | Admitting: Anesthesiology

## 2015-12-28 ENCOUNTER — Ambulatory Visit: Admission: RE | Admit: 2015-12-28 | Payer: Self-pay | Source: Ambulatory Visit | Admitting: Gastroenterology

## 2015-12-28 ENCOUNTER — Ambulatory Visit
Admission: RE | Admit: 2015-12-28 | Discharge: 2015-12-28 | Disposition: A | Discharge status: Home / Self Care | Payer: 59 | Source: Ambulatory Visit | Attending: Gastroenterology | Admitting: Gastroenterology

## 2015-12-28 ENCOUNTER — Encounter: Admission: RE | Payer: Self-pay | Source: Ambulatory Visit

## 2015-12-28 DIAGNOSIS — Z8673 Personal history of transient ischemic attack (TIA), and cerebral infarction without residual deficits: Secondary | ICD-10-CM | POA: Diagnosis not present

## 2015-12-28 DIAGNOSIS — E559 Vitamin D deficiency, unspecified: Secondary | ICD-10-CM | POA: Diagnosis not present

## 2015-12-28 DIAGNOSIS — Z882 Allergy status to sulfonamides status: Secondary | ICD-10-CM | POA: Insufficient documentation

## 2015-12-28 DIAGNOSIS — K641 Second degree hemorrhoids: Secondary | ICD-10-CM | POA: Insufficient documentation

## 2015-12-28 DIAGNOSIS — E785 Hyperlipidemia, unspecified: Secondary | ICD-10-CM | POA: Insufficient documentation

## 2015-12-28 DIAGNOSIS — Z888 Allergy status to other drugs, medicaments and biological substances status: Secondary | ICD-10-CM | POA: Insufficient documentation

## 2015-12-28 DIAGNOSIS — Z1211 Encounter for screening for malignant neoplasm of colon: Secondary | ICD-10-CM | POA: Diagnosis present

## 2015-12-28 DIAGNOSIS — Z880 Allergy status to penicillin: Secondary | ICD-10-CM | POA: Diagnosis not present

## 2015-12-28 DIAGNOSIS — I10 Essential (primary) hypertension: Secondary | ICD-10-CM | POA: Diagnosis not present

## 2015-12-28 DIAGNOSIS — M13879 Other specified arthritis, unspecified ankle and foot: Secondary | ICD-10-CM | POA: Insufficient documentation

## 2015-12-28 DIAGNOSIS — Z885 Allergy status to narcotic agent status: Secondary | ICD-10-CM | POA: Insufficient documentation

## 2015-12-28 DIAGNOSIS — K5730 Diverticulosis of large intestine without perforation or abscess without bleeding: Secondary | ICD-10-CM | POA: Insufficient documentation

## 2015-12-28 DIAGNOSIS — Z7982 Long term (current) use of aspirin: Secondary | ICD-10-CM | POA: Insufficient documentation

## 2015-12-28 HISTORY — DX: Unspecified osteoarthritis, unspecified site: M19.90

## 2015-12-28 HISTORY — DX: Transient cerebral ischemic attack, unspecified: G45.9

## 2015-12-28 HISTORY — DX: Adverse effect of unspecified anesthetic, initial encounter: T41.45XA

## 2015-12-28 HISTORY — DX: Motion sickness, initial encounter: T75.3XXA

## 2015-12-28 HISTORY — DX: Other complications of anesthesia, initial encounter: T88.59XA

## 2015-12-28 HISTORY — PX: COLONOSCOPY WITH PROPOFOL: SHX5780

## 2015-12-28 SURGERY — COLONOSCOPY
Anesthesia: General

## 2015-12-28 SURGERY — COLONOSCOPY WITH PROPOFOL
Anesthesia: Choice

## 2015-12-28 SURGERY — COLONOSCOPY WITH PROPOFOL
Anesthesia: Monitor Anesthesia Care | Wound class: Contaminated

## 2015-12-28 MED ORDER — MEPERIDINE HCL 25 MG/ML IJ SOLN: 6.2500 mg | INTRAMUSCULAR | Status: DC | PRN

## 2015-12-28 MED ORDER — LIDOCAINE HCL (CARDIAC) 20 MG/ML IV SOLN
INTRAVENOUS | Status: DC | PRN
  Administered 2015-12-28: 50 mg via INTRAVENOUS

## 2015-12-28 MED ORDER — LACTATED RINGERS IV SOLN
INTRAVENOUS | Status: DC
  Administered 2015-12-28: 08:00:00 via INTRAVENOUS

## 2015-12-28 MED ORDER — OXYCODONE HCL 5 MG PO TABS: 5.0000 mg | ORAL_TABLET | Freq: Once | ORAL | Status: DC | PRN

## 2015-12-28 MED ORDER — PROMETHAZINE HCL 25 MG/ML IJ SOLN: 6.2500 mg | INTRAMUSCULAR | Status: DC | PRN

## 2015-12-28 MED ORDER — PROPOFOL 10 MG/ML IV BOLUS
INTRAVENOUS | Status: DC | PRN
  Administered 2015-12-28 (×3): 20 mg via INTRAVENOUS
  Administered 2015-12-28: 100 mg via INTRAVENOUS
  Administered 2015-12-28 (×4): 30 mg via INTRAVENOUS
  Administered 2015-12-28: 50 mg via INTRAVENOUS

## 2015-12-28 MED ORDER — HYDROMORPHONE HCL 1 MG/ML IJ SOLN: 0.2500 mg | INTRAMUSCULAR | Status: DC | PRN

## 2015-12-28 MED ORDER — STERILE WATER FOR IRRIGATION IR SOLN
Status: DC | PRN
  Administered 2015-12-28: 09:00:00

## 2015-12-28 MED ORDER — OXYCODONE HCL 5 MG/5ML PO SOLN: 5.0000 mg | Freq: Once | ORAL | Status: DC | PRN

## 2015-12-28 SURGICAL SUPPLY — 28 items
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
## 2015-12-28 NOTE — Op Note (Signed)
[facility] Patient Name: Angela Hodges Procedure Date: 12/28/2015 8:27 AM MRN: [id number] Account #: [id number] Date of Birth: 08/14/1956 Admit Type: Outpatient Age: 59 Room: [facility] OR ROOM 01 Gender: Female Note Status: Finalized Procedure:         Colonoscopy Indications:       Screening for colorectal malignant neoplasm Providers:         Midge Minium, MD Medicines:         Propofol per Anesthesia Complications:     No immediate complications. Procedure:         Pre-Anesthesia Assessment:                    - Prior to the procedure, a History and Physical was                     performed, and patient medications and allergies were                     reviewed. The patient's tolerance of previous anesthesia                     was also reviewed. The risks and benefits of the procedure                     and the sedation options and risks were discussed with the                     patient. All questions were answered, and informed consent                     was obtained. Prior Anticoagulants: The patient has taken                     no previous anticoagulant or antiplatelet agents. ASA                     Grade Assessment: II - A patient with mild systemic                     disease. After reviewing the risks and benefits, the                     patient was deemed in satisfactory condition to undergo                     the procedure.                    After obtaining informed consent, the colonoscope was                     passed under direct vision. Throughout the procedure, the                     patient's blood pressure, pulse, and oxygen saturations                     were monitored continuously. The Olympus CF H180AL                     colonoscope (S#: [id number]) was introduced through the anus                     and advanced to the the cecum, identified by  appendiceal                     orifice and ileocecal valve. The  colonoscopy was performed                     without difficulty. The patient tolerated the procedure                     well. The quality of the bowel preparation was excellent. Findings:      The perianal and digital rectal examinations were normal.      Multiple small-mouthed diverticula were found in the sigmoid colon and       in the descending colon.      Non-bleeding internal hemorrhoids were found during retroflexion. The       hemorrhoids were Grade II (internal hemorrhoids that prolapse but reduce       spontaneously). Impression:        - Diverticulosis in the sigmoid colon and in the                     descending colon.                    - Non-bleeding internal hemorrhoids.                    - No specimens collected. Recommendation:    - Repeat colonoscopy in 10 years for screening unless any                     change in family history or lower GI problems. Procedure Code(s): --- Professional ---                    [phone], Colonoscopy, flexible; diagnostic, including                     collection of specimen(s) by brushing or washing, when                     performed (separate procedure) Diagnosis Code(s): --- Professional ---                    Z12.11, Encounter for screening for malignant neoplasm of                     colon CPT copyright 2014 American Medical Association. All rights reserved. The codes documented in this report are preliminary and upon coder review may  be revised to meet current compliance requirements. Midge Minium, MD 12/28/2015 8:54:20 AM This report has been signed electronically. Number of Addenda: 0 Note Initiated On: 12/28/2015 8:27 AM Scope Withdrawal Time: 0 hours 11 minutes 50 seconds  Total Procedure Duration: 0 hours 15 minutes 13 seconds       [facility]

## 2015-12-28 NOTE — Anesthesia Procedure Notes (Signed)
Procedure Name: MAC Performed by: Tu Shimmel Pre-anesthesia Checklist: Patient identified, Emergency Drugs available, Suction available, Timeout performed and Patient being monitored Patient Re-evaluated:Patient Re-evaluated prior to inductionOxygen Delivery Method: Nasal cannula Placement Confirmation: positive ETCO2     

## 2015-12-28 NOTE — Anesthesia Postprocedure Evaluation (Signed)
Anesthesia Post Note  Patient: Angela Hodges  Procedure(s) Performed: Procedure(s) (LRB): COLONOSCOPY WITH PROPOFOL (N/A)  Patient location during evaluation: PACU Anesthesia Type: MAC Level of consciousness: awake and alert and oriented Pain management: pain level controlled Vital Signs Assessment: post-procedure vital signs reviewed and stable Respiratory status: spontaneous breathing and nonlabored ventilation Cardiovascular status: stable Postop Assessment: no signs of nausea or vomiting and adequate PO intake Anesthetic complications: no    Harolyn RutherfordJoshua Bexton Haak

## 2015-12-28 NOTE — Discharge Instructions (Signed)

## 2015-12-28 NOTE — Transfer of Care (Signed)
Immediate Anesthesia Transfer of Care Note  Patient: Angela Hodges  Procedure(s) Performed: Procedure(s): COLONOSCOPY WITH PROPOFOL (N/A)  Patient Location: PACU  Anesthesia Type: MAC  Level of Consciousness: awake, alert  and patient cooperative  Airway and Oxygen Therapy: Patient Spontanous Breathing and Patient connected to supplemental oxygen  Post-op Assessment: Post-op Vital signs reviewed, Patient's Cardiovascular Status Stable, Respiratory Function Stable, Patent Airway and No signs of Nausea or vomiting  Post-op Vital Signs: Reviewed and stable  Complications: No apparent anesthesia complications

## 2015-12-28 NOTE — Anesthesia Preprocedure Evaluation (Signed)
Anesthesia Evaluation  Patient identified by MRN, date of birth, ID band Patient awake    Reviewed: Allergy & Precautions, NPO status , Patient's Chart, lab work & pertinent test results, reviewed documented beta blocker date and time   Airway Mallampati: II  TM Distance: >3 FB Neck ROM: Full    Dental no notable dental hx.    Pulmonary neg pulmonary ROS,    Pulmonary exam normal        Cardiovascular hypertension, Pt. on medications Normal cardiovascular exam     Neuro/Psych negative neurological ROS     GI/Hepatic negative GI ROS, Neg liver ROS,   Endo/Other    Renal/GU negative Renal ROS     Musculoskeletal  (+) Arthritis ,   Abdominal   Peds  Hematology negative hematology ROS (+)   Anesthesia Other Findings   Reproductive/Obstetrics                             Anesthesia Physical Anesthesia Plan  ASA: II  Anesthesia Plan: MAC   Post-op Pain Management:    Induction: Intravenous  Airway Management Planned:   Additional Equipment:   Intra-op Plan:   Post-operative Plan:   Informed Consent: I have reviewed the patients History and Physical, chart, labs and discussed the procedure including the risks, benefits and alternatives for the proposed anesthesia with the patient or authorized representative who has indicated his/her understanding and acceptance.     Plan Discussed with: CRNA  Anesthesia Plan Comments:         Anesthesia Quick Evaluation

## 2016-01-18 ENCOUNTER — Ambulatory Visit: Payer: 59 | Admitting: Physician Assistant

## 2016-01-21 ENCOUNTER — Ambulatory Visit: Payer: 59 | Admitting: Physician Assistant

## 2016-01-24 ENCOUNTER — Ambulatory Visit: Payer: 59 | Admitting: Physician Assistant

## 2016-02-15 ENCOUNTER — Encounter: Payer: Self-pay | Admitting: Family Medicine

## 2016-02-15 ENCOUNTER — Ambulatory Visit (INDEPENDENT_AMBULATORY_CARE_PROVIDER_SITE_OTHER): Payer: 59 | Admitting: Family Medicine

## 2016-02-15 VITALS — BP 132/90 | HR 81 | Temp 98.1°F | Resp 16 | Wt 259.0 lb

## 2016-02-15 DIAGNOSIS — G43009 Migraine without aura, not intractable, without status migrainosus: Secondary | ICD-10-CM | POA: Diagnosis not present

## 2016-02-15 NOTE — Progress Notes (Signed)
Subjective:     Patient ID: Angela Hodges, female   DOB: 1956-10-09, 60 y.o.   MRN: [id number]  HPI  Chief Complaint  Patient presents with  . Headache    Patient comes in office today with concerns of headache for the past two days. Patient describes pain as tightness, she states that her blood pressure at home has been elevated and was unsure if it was related. Patient reports taking Naproxen but had no relief.   States she developed 10/10 throbbing headache associated with dizziness and nausea yesterday. Bp was 147/97 at that time. Reports stress trigger and hx of rare migraine headaches. Feels better today with 5/10 residual pressure behind her eyes. Not returning to work today.,   Review of Systems     Objective:   Physical Exam  Constitutional: She appears well-developed and well-nourished. No distress.  Eyes: EOM are normal. Pupils are equal, round, and reactive to light.  Musculoskeletal:  Grip strength 5/5 symmetrically.  Neurological: Coordination (finger to nose WNL) normal.       Assessment:    1. Migraine without aura and without status migrainosus, not intractable: spontaneously resolving    Plan:    Monitor to resolution. Avoid analgesics.

## 2016-02-15 NOTE — Patient Instructions (Signed)
See if this will spontaneously get better today without further pain medication.

## 2016-03-20 ENCOUNTER — Other Ambulatory Visit: Payer: Self-pay

## 2016-03-20 ENCOUNTER — Other Ambulatory Visit: Payer: Self-pay | Admitting: Family Medicine

## 2016-03-20 DIAGNOSIS — A601 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: Secondary | ICD-10-CM

## 2016-03-20 DIAGNOSIS — G4700 Insomnia, unspecified: Secondary | ICD-10-CM

## 2016-03-20 MED ORDER — VALACYCLOVIR HCL 1 G PO TABS
1000.0000 mg | ORAL_TABLET | Freq: Every day | ORAL | Status: DC
Start: 2016-03-20 — End: 2016-03-21

## 2016-03-20 MED ORDER — ZOLPIDEM TARTRATE 10 MG PO TABS: ORAL_TABLET | ORAL | Status: DC

## 2016-03-21 ENCOUNTER — Other Ambulatory Visit: Payer: Self-pay | Admitting: Physician Assistant

## 2016-03-21 DIAGNOSIS — A601 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: Secondary | ICD-10-CM

## 2016-03-21 MED ORDER — VALACYCLOVIR HCL 1 G PO TABS: 1000.0000 mg | ORAL_TABLET | Freq: Every day | ORAL | Status: DC

## 2016-04-11 ENCOUNTER — Ambulatory Visit: Payer: 59 | Admitting: Physician Assistant

## 2016-04-11 ENCOUNTER — Encounter: Payer: Self-pay | Admitting: Physician Assistant

## 2016-04-11 ENCOUNTER — Ambulatory Visit (INDEPENDENT_AMBULATORY_CARE_PROVIDER_SITE_OTHER): Payer: 59 | Admitting: Physician Assistant

## 2016-04-11 VITALS — BP 140/80 | HR 73 | Temp 98.1°F | Resp 16 | Wt 264.4 lb

## 2016-04-11 DIAGNOSIS — M25511 Pain in right shoulder: Secondary | ICD-10-CM

## 2016-04-11 DIAGNOSIS — G8929 Other chronic pain: Secondary | ICD-10-CM | POA: Diagnosis not present

## 2016-04-11 DIAGNOSIS — M25512 Pain in left shoulder: Secondary | ICD-10-CM | POA: Diagnosis not present

## 2016-04-11 DIAGNOSIS — L987 Excessive and redundant skin and subcutaneous tissue: Secondary | ICD-10-CM

## 2016-04-11 NOTE — Progress Notes (Signed)
Patient: Angela Hodges Female    DOB: [birth year]   60 y.o.   MRN: [id number] Visit Date: 04/11/2016  Today's Provider: Margaretann Loveless, PA-C   Chief Complaint  Patient presents with  . Shoulder Pain   Subjective:    Shoulder Pain  The pain is present in the right shoulder, left shoulder and neck. This is a new (She wants a referral to plastic surgery) problem. The current episode started more than 1 year ago. There has been no history of extremity trauma. The quality of the pain is described as aching. The pain is at a severity of 4/10 (But after work is 9/10). Pertinent negatives include no fever, inability to bear weight, itching, joint locking, joint swelling, limited range of motion, numbness, stiffness or tingling. The symptoms are aggravated by activity. Treatments tried: Ibuprofen and Aleve. The treatment provided mild relief.   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 She is starting to have difficulty raising her arms above her head and cannot do any overhead activities for long periods because her arms give out. She is starting to have a dull ache in her shoulders bilaterally that is becoming constant. She also sometimes notices a foul smell that will come from the skin. She denies any rashes or infections. She tries to keep the skin clean and dry.    Allergies  Allergen Reactions  . Penicillins Hives  . Ranitidine Hcl Hives  . Sulfa Antibiotics Hives  . Tramadol Hcl Hives   Previous Medications   ASPIRIN 81 MG TABLET    Take 1 tablet by mouth daily.   BELVIQ 10 MG TABS    Take 1 tablet by mouth every morning.   CHOLECALCIFEROL 1000 UNITS CAPSULE    Take 1 capsule by mouth 3 (three) times daily.   FLUTICASONE (FLONASE) 50 MCG/ACT NASAL SPRAY    Place into the nose.   MELOXICAM (MOBIC) 15 MG TABLET    TAKE 1 TABLET DAILY AFTER A MEAL   NAPROXEN (NAPROSYN) 500 MG TABLET    TAKE ONE TABLET BY  MOUTH TWICE A DAY WITH FOOD   NYSTATIN CREAM (MYCOSTATIN)    Apply 1 application topically 2 (two) times daily.   OMEGA-3 FATTY ACIDS (FISH OIL) 1200 MG CAPS    Take by mouth.   TRIAMCINOLONE CREAM (KENALOG) 0.1 %    Reported on 12/28/2015   VALACYCLOVIR (VALTREX) 1000 MG TABLET    Take 1 tablet (1,000 mg total) by mouth daily.   VALSARTAN-HYDROCHLOROTHIAZIDE (DIOVAN-HCT) 160-25 MG TABLET    Take 1 tablet by mouth  daily   ZOLPIDEM (AMBIEN) 10 MG TABLET    1/2 to one at bedtime as needed for insomnia    Review of Systems  Constitutional: Negative for fever.  Respiratory: Negative.   Cardiovascular: Negative.   Gastrointestinal: Negative.   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 Negative for stiffness and neck stiffness.  Skin: Negative.  Negative for itching.  Neurological: Negative for tingling and numbness.    Social History  Substance Use Topics  . Smoking status: Never Smoker   . Smokeless tobacco: Never Used  . Alcohol Use: No   Objective:   BP 140/80 mmHg  Pulse 73  Temp(Src) 98.1 F (36.7 C) (Oral)  Resp 16  Wt 264 lb 6.4 oz (119.931 kg)  Physical Exam  Constitutional:  She appears well-developed and well-nourished. No distress.  Neck: Normal range of motion. Neck supple. No JVD present. No tracheal deviation present. No thyromegaly present.  Cardiovascular: Normal rate, regular rhythm and normal heart sounds.  Exam reveals no gallop and no friction rub.   No murmur heard. Pulmonary/Chest: Effort normal and breath sounds normal. No respiratory distress. She has no wheezes. She has no rales.  Musculoskeletal:       Right shoulder: She exhibits decreased range of motion. She exhibits no tenderness, no bony tenderness, no swelling, no effusion, no crepitus, no pain, no spasm, normal pulse and normal strength.       Left shoulder: She exhibits decreased range of motion. She exhibits no tenderness, no  bony tenderness, no swelling, no effusion, no crepitus, no deformity, no pain, no spasm, normal pulse and normal strength.  ROM limited with abduction (only slightly) due to weight of arm  Lymphadenopathy:    She has no cervical adenopathy.  Skin: She is not diaphoretic.  Vitals reviewed.       Assessment & Plan:     1. Excess skin of arm She has been seen by plastic surgery on two different occasions for this but always backed out because of financial reasons. She is now trying to lose weight and make good lifestyle changes but she is having difficulty doing certain activities she enjoys secondary to not being able to lift her arms for long periods of time. 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 Will refer to plastic surgery for further evaluation and consideration of bilateral brachioplasty. - Ambulatory referral to Plastic Surgery  2. Chronic pain of both shoulders See above medical treatment plan. - Ambulatory referral to Plastic Surgery       Margaretann Loveless, PA-C  [facility]

## 2016-04-17 ENCOUNTER — Ambulatory Visit: Payer: 59 | Admitting: Physician Assistant

## 2016-04-29 ENCOUNTER — Telehealth: Payer: Self-pay | Admitting: Physician Assistant

## 2016-04-29 NOTE — Telephone Encounter (Signed)
Pt contacted office for refill request on the following medications: BELVIQ 10 MG TABS to [facility]. Pt stated that at her last OV on 04/11/16 she was advised that when she needed a refill for this medication she could call the office and Antony ContrasJenni would send it in. Please advise. Thanks TNP

## 2016-04-29 NOTE — Telephone Encounter (Signed)
LMTCB- Patient needs an office visit for the Belviq. Last time she was seen for her weight was 12/07/15 on 04/14 it was for an acute visit. Also Antony ContrasJenni wants to see if she wants Naprosyn or Meloxicam. Can't have both.  Thanks,  -Joseline

## 2016-04-30 NOTE — Telephone Encounter (Signed)
Pt called back.  She said you can leave a detailed message.  [phone].  Thanks Barth Kirkseri

## 2016-04-30 NOTE — Telephone Encounter (Signed)
Spoke with patient advised her that she needed an appointment in order to get Belviq refill. She scheduled for May 5. She also reports she doesn't need any refill on Naprosyn or Meloxicam.  Thanks,  -Cristina Ceniceros

## 2016-05-02 ENCOUNTER — Ambulatory Visit (INDEPENDENT_AMBULATORY_CARE_PROVIDER_SITE_OTHER): Payer: 59 | Admitting: Physician Assistant

## 2016-05-02 ENCOUNTER — Encounter: Payer: Self-pay | Admitting: Physician Assistant

## 2016-05-02 VITALS — BP 132/80 | HR 80 | Temp 97.8°F | Resp 16 | Wt 260.0 lb

## 2016-05-02 DIAGNOSIS — Z713 Dietary counseling and surveillance: Secondary | ICD-10-CM

## 2016-05-02 DIAGNOSIS — Z6841 Body Mass Index (BMI) 40.0 and over, adult: Secondary | ICD-10-CM | POA: Diagnosis not present

## 2016-05-02 MED ORDER — BELVIQ 10 MG PO TABS: 1.0000 | ORAL_TABLET | Freq: Two times a day (BID) | ORAL | Status: DC

## 2016-05-02 NOTE — Progress Notes (Signed)
Patient ID: Angela Hodges, female   DOB: 03-15-1956, 60 y.o.   MRN: [id number]       Patient: Angela Hodges Female    DOB: 1956/01/08   60 y.o.   MRN: [id number] Visit Date: 05/02/2016  Today's Provider: Margaretann Loveless, PA-C   Chief Complaint  Patient presents with  . Obesity   Subjective:    HPI Pt is here for a follow up of obesity and weight loss. She was started on Belviq on 04/11/16. Since then she has las lost 4 pounds. She is not having any side effects to the medication. She reports that she is eating much healthier and less than before. She is still not able to exercise as much as she would like because of her foot injury. She is riding a stationary bike a couple of nights a week.     Allergies  Allergen Reactions  . Penicillins Hives  . Ranitidine Hcl Hives  . Sulfa Antibiotics Hives  . Tramadol Hcl Hives   Previous Medications   ASPIRIN 81 MG TABLET    Take 1 tablet by mouth daily.   BELVIQ 10 MG TABS    Take 1 tablet by mouth every morning.   CHOLECALCIFEROL 1000 UNITS CAPSULE    Take 1 capsule by mouth 3 (three) times daily.   FLUTICASONE (FLONASE) 50 MCG/ACT NASAL SPRAY    Place into the nose.   MELOXICAM (MOBIC) 15 MG TABLET    Reported on 05/02/2016   NAPROXEN (NAPROSYN) 500 MG TABLET    TAKE ONE TABLET BY MOUTH TWICE A DAY WITH FOOD   NYSTATIN CREAM (MYCOSTATIN)    Apply 1 application topically 2 (two) times daily.   OMEGA-3 FATTY ACIDS (FISH OIL) 1200 MG CAPS    Take by mouth.   TRIAMCINOLONE CREAM (KENALOG) 0.1 %    Reported on 12/28/2015   VALACYCLOVIR (VALTREX) 1000 MG TABLET    Take 1 tablet (1,000 mg total) by mouth daily.   VALSARTAN-HYDROCHLOROTHIAZIDE (DIOVAN-HCT) 160-25 MG TABLET    Take 1 tablet by mouth  daily   ZOLPIDEM (AMBIEN) 10 MG TABLET    1/2 to one at bedtime as needed for insomnia    Review of Systems  Constitutional: Negative.   HENT: Negative.   Eyes: Negative.   Respiratory: Negative.   Cardiovascular: Negative.     Gastrointestinal: Negative.   Endocrine: Negative.   Genitourinary: Negative.   Musculoskeletal: Negative.   Skin: Negative.   Allergic/Immunologic: Negative.   Neurological: Negative.   Hematological: Negative.   Psychiatric/Behavioral: Negative.     Social History  Substance Use Topics  . Smoking status: Never Smoker   . Smokeless tobacco: Never Used  . Alcohol Use: No   Objective:   BP 132/80 mmHg  Pulse 80  Temp(Src) 97.8 F (36.6 C) (Oral)  Resp 16  Wt 260 lb (117.935 kg)  Physical Exam  Constitutional: She appears well-developed and well-nourished. No distress.  Neck: Normal range of motion. Neck supple.  Cardiovascular: Normal rate, regular rhythm and normal heart sounds.  Exam reveals no gallop and no friction rub.   No murmur heard. Pulmonary/Chest: Effort normal and breath sounds normal. No respiratory distress. She has no wheezes. She has no rales.  Skin: She is not diaphoretic.  Psychiatric: She has a normal mood and affect. Her behavior is normal. Judgment and thought content normal.  Vitals reviewed.       Assessment & Plan:     1. Encounter for weight loss  counseling Will increase Belviq to BID dosing since she is tolerating well. Continue decreasing portion size and adding physical activity. Increase water intake. I will see her back in 4 weeks for weight recheck.  - BELVIQ 10 MG TABS; Take 1 tablet by mouth 2 (two) times daily.  Dispense: 60 tablet; Refill: 0  2. Morbid obesity due to excess calories [facility]) See above medical treatment plan. - BELVIQ 10 MG TABS; Take 1 tablet by mouth 2 (two) times daily.  Dispense: 60 tablet; Refill: 0  3. BMI 40.0-44.9, adult [facility]) See above medical treatment plan.       Margaretann Loveless, PA-C  [facility]

## 2016-05-02 NOTE — Patient Instructions (Signed)

## 2016-05-30 ENCOUNTER — Ambulatory Visit: Payer: 59 | Admitting: Physician Assistant

## 2016-07-23 ENCOUNTER — Other Ambulatory Visit: Payer: Self-pay | Admitting: Physician Assistant

## 2016-07-23 DIAGNOSIS — A601 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: Secondary | ICD-10-CM

## 2016-07-24 ENCOUNTER — Telehealth: Payer: Self-pay

## 2016-07-24 ENCOUNTER — Other Ambulatory Visit: Payer: Self-pay | Admitting: Physician Assistant

## 2016-07-24 DIAGNOSIS — G4700 Insomnia, unspecified: Secondary | ICD-10-CM

## 2016-07-24 MED ORDER — ZOLPIDEM TARTRATE 10 MG PO TABS: ORAL_TABLET | ORAL | 1 refills | Status: DC

## 2016-07-24 NOTE — Progress Notes (Signed)
Please call in or fax Rx for Zolpidem Tartrate 10mg  Take 1/2 to 1 tab PO q h.s. Prn sleep #90 RF1 to [facility] [phone]. Thanks.

## 2016-07-24 NOTE — Telephone Encounter (Signed)
-----   Message from Margaretann Loveless, [place] sent at 07/24/2016 11:07 AM EDT ----- Please call in or fax Rx for Zolpidem Tartrate 10mg  Take 1/2 to 1 tab PO q h.s. Prn sleep #90 RF1 to [facility] [phone]. Thanks.

## 2016-07-24 NOTE — Telephone Encounter (Signed)
Prescription called to [facility].

## 2016-08-07 ENCOUNTER — Other Ambulatory Visit: Payer: Self-pay | Admitting: Physician Assistant

## 2016-08-07 DIAGNOSIS — Z713 Dietary counseling and surveillance: Secondary | ICD-10-CM

## 2016-08-07 MED ORDER — LORCASERIN HCL 10 MG PO TABS: 1.0000 | ORAL_TABLET | Freq: Two times a day (BID) | ORAL | 0 refills | Status: DC

## 2016-08-07 NOTE — Progress Notes (Signed)
Please call in Belviq 10mg  tab 1 tab PO BID #[id number] NR to [phone] Thanks

## 2016-08-08 NOTE — Progress Notes (Signed)
rx was called in. Allene DillonEmily Drozdowski, CMA

## 2016-08-15 ENCOUNTER — Encounter: Payer: Self-pay | Admitting: Physician Assistant

## 2016-08-15 ENCOUNTER — Ambulatory Visit (INDEPENDENT_AMBULATORY_CARE_PROVIDER_SITE_OTHER): Payer: 59 | Admitting: Physician Assistant

## 2016-08-15 VITALS — BP 130/82 | HR 62 | Temp 98.4°F | Resp 14 | Ht 64.25 in | Wt 263.6 lb

## 2016-08-15 DIAGNOSIS — L918 Other hypertrophic disorders of the skin: Secondary | ICD-10-CM

## 2016-08-15 DIAGNOSIS — M25512 Pain in left shoulder: Secondary | ICD-10-CM

## 2016-08-15 DIAGNOSIS — M25511 Pain in right shoulder: Secondary | ICD-10-CM

## 2016-08-15 DIAGNOSIS — Z0000 Encounter for general adult medical examination without abnormal findings: Secondary | ICD-10-CM

## 2016-08-15 DIAGNOSIS — Z124 Encounter for screening for malignant neoplasm of cervix: Secondary | ICD-10-CM | POA: Diagnosis not present

## 2016-08-15 DIAGNOSIS — Z0189 Encounter for other specified special examinations: Secondary | ICD-10-CM | POA: Diagnosis not present

## 2016-08-15 DIAGNOSIS — Z1159 Encounter for screening for other viral diseases: Secondary | ICD-10-CM

## 2016-08-15 DIAGNOSIS — Z1239 Encounter for other screening for malignant neoplasm of breast: Secondary | ICD-10-CM | POA: Diagnosis not present

## 2016-08-15 DIAGNOSIS — Z008 Encounter for other general examination: Secondary | ICD-10-CM

## 2016-08-15 NOTE — Patient Instructions (Signed)
Health Maintenance, Female Adopting a healthy lifestyle and getting preventive care can go a long way to promote health and wellness. Talk with your health care provider about what schedule of regular examinations is right for you. This is a good chance for you to check in with your provider about disease prevention and staying healthy. In between checkups, there are plenty of things you can do on your own. Experts have done a lot of research about which lifestyle changes and preventive measures are most likely to keep you healthy. Ask your health care provider for more information. WEIGHT AND DIET  Eat a healthy diet  Be sure to include plenty of vegetables, fruits, low-fat dairy products, and lean protein.  Do not eat a lot of foods high in solid fats, added sugars, or salt.  Get regular exercise. This is one of the most important things you can do for your health.  Most adults should exercise for at least 150 minutes each week. The exercise should increase your heart rate and make you sweat (moderate-intensity exercise).  Most adults should also do strengthening exercises at least twice a week. This is in addition to the moderate-intensity exercise.  Maintain a healthy weight  Body mass index (BMI) is a measurement that can be used to identify possible weight problems. It estimates body fat based on height and weight. Your health care provider can help determine your BMI and help you achieve or maintain a healthy weight.  For females 28 years of age and older:   A BMI below 18.5 is considered underweight.  A BMI of 18.5 to 24.9 is normal.  A BMI of 25 to 29.9 is considered overweight.  A BMI of 30 and above is considered obese.  Watch levels of cholesterol and blood lipids  You should start having your blood tested for lipids and cholesterol at 58 years of age, then have this test every 5 years.  You may need to have your cholesterol levels checked more often if:  Your lipid  or cholesterol levels are high.  You are older than 58 years of age.  You are at high risk for heart disease.  CANCER SCREENING   Lung Cancer  Lung cancer screening is recommended for adults 75-66 years old who are at high risk for lung cancer because of a history of smoking.  A yearly low-dose CT scan of the lungs is recommended for people who:  Currently smoke.  Have quit within the past 15 years.  Have at least a 30-pack-year history of smoking. A pack year is smoking an average of one pack of cigarettes a day for 1 year.  Yearly screening should continue until it has been 15 years since you quit.  Yearly screening should stop if you develop a health problem that would prevent you from having lung cancer treatment.  Breast Cancer  Practice breast self-awareness. This means understanding how your breasts normally appear and feel.  It also means doing regular breast self-exams. Let your health care provider know about any changes, no matter how small.  If you are in your 20s or 30s, you should have a clinical breast exam (CBE) by a health care provider every 1-3 years as part of a regular health exam.  If you are 25 or older, have a CBE every year. Also consider having a breast X-ray (mammogram) every year.  If you have a family history of breast cancer, talk to your health care provider about genetic screening.  If you  are at high risk for breast cancer, talk to your health care provider about having an MRI and a mammogram every year.  Breast cancer gene (BRCA) assessment is recommended for women who have family members with BRCA-related cancers. BRCA-related cancers include:  Breast.  Ovarian.  Tubal.  Peritoneal cancers.  Results of the assessment will determine the need for genetic counseling and BRCA1 and BRCA2 testing. Cervical Cancer Your health care provider may recommend that you be screened regularly for cancer of the pelvic organs (ovaries, uterus, and  vagina). This screening involves a pelvic examination, including checking for microscopic changes to the surface of your cervix (Pap test). You may be encouraged to have this screening done every 3 years, beginning at age 21.  For women ages 30-65, health care providers may recommend pelvic exams and Pap testing every 3 years, or they may recommend the Pap and pelvic exam, combined with testing for human papilloma virus (HPV), every 5 years. Some types of HPV increase your risk of cervical cancer. Testing for HPV may also be done on women of any age with unclear Pap test results.  Other health care providers may not recommend any screening for nonpregnant women who are considered low risk for pelvic cancer and who do not have symptoms. Ask your health care provider if a screening pelvic exam is right for you.  If you have had past treatment for cervical cancer or a condition that could lead to cancer, you need Pap tests and screening for cancer for at least 20 years after your treatment. If Pap tests have been discontinued, your risk factors (such as having a new sexual partner) need to be reassessed to determine if screening should resume. Some women have medical problems that increase the chance of getting cervical cancer. In these cases, your health care provider may recommend more frequent screening and Pap tests. Colorectal Cancer  This type of cancer can be detected and often prevented.  Routine colorectal cancer screening usually begins at 58 years of age and continues through 58 years of age.  Your health care provider may recommend screening at an earlier age if you have risk factors for colon cancer.  Your health care provider may also recommend using home test kits to check for hidden blood in the stool.  A small camera at the end of a tube can be used to examine your colon directly (sigmoidoscopy or colonoscopy). This is done to check for the earliest forms of colorectal  cancer.  Routine screening usually begins at age 50.  Direct examination of the colon should be repeated every 5-10 years through 58 years of age. However, you may need to be screened more often if early forms of precancerous polyps or small growths are found. Skin Cancer  Check your skin from head to toe regularly.  Tell your health care provider about any new moles or changes in moles, especially if there is a change in a mole's shape or color.  Also tell your health care provider if you have a mole that is larger than the size of a pencil eraser.  Always use sunscreen. Apply sunscreen liberally and repeatedly throughout the day.  Protect yourself by wearing long sleeves, pants, a wide-brimmed hat, and sunglasses whenever you are outside. HEART DISEASE, DIABETES, AND HIGH BLOOD PRESSURE   High blood pressure causes heart disease and increases the risk of stroke. High blood pressure is more likely to develop in:  People who have blood pressure in the high end   of the normal range (130-139/85-89 mm Hg).  People who are overweight or obese.  People who are African American.  If you are 38-23 years of age, have your blood pressure checked every 3-5 years. If you are 61 years of age or older, have your blood pressure checked every year. You should have your blood pressure measured twice--once when you are at a hospital or clinic, and once when you are not at a hospital or clinic. Record the average of the two measurements. To check your blood pressure when you are not at a hospital or clinic, you can use:  An automated blood pressure machine at a pharmacy.  A home blood pressure monitor.  If you are between 45 years and 39 years old, ask your health care provider if you should take aspirin to prevent strokes.  Have regular diabetes screenings. This involves taking a blood sample to check your fasting blood sugar level.  If you are at a normal weight and have a low risk for diabetes,  have this test once every three years after 58 years of age.  If you are overweight and have a high risk for diabetes, consider being tested at a younger age or more often. PREVENTING INFECTION  Hepatitis B  If you have a higher risk for hepatitis B, you should be screened for this virus. You are considered at high risk for hepatitis B if:  You were born in a country where hepatitis B is common. Ask your health care provider which countries are considered high risk.  Your parents were born in a high-risk country, and you have not been immunized against hepatitis B (hepatitis B vaccine).  You have HIV or AIDS.  You use needles to inject street drugs.  You live with someone who has hepatitis B.  You have had sex with someone who has hepatitis B.  You get hemodialysis treatment.  You take certain medicines for conditions, including cancer, organ transplantation, and autoimmune conditions. Hepatitis C  Blood testing is recommended for:  Everyone born from [birth year] through 1963.  Anyone with known risk factors for hepatitis C. Sexually transmitted infections (STIs)  You should be screened for sexually transmitted infections (STIs) including gonorrhea and chlamydia if:  You are sexually active and are younger than 58 years of age.  You are older than 58 years of age and your health care provider tells you that you are at risk for this type of infection.  Your sexual activity has changed since you were last screened and you are at an increased risk for chlamydia or gonorrhea. Ask your health care provider if you are at risk.  If you do not have HIV, but are at risk, it may be recommended that you take a prescription medicine daily to prevent HIV infection. This is called pre-exposure prophylaxis (PrEP). You are considered at risk if:  You are sexually active and do not regularly use condoms or know the HIV status of your partner(s).  You take drugs by injection.  You are sexually  active with a partner who has HIV. Talk with your health care provider about whether you are at high risk of being infected with HIV. If you choose to begin PrEP, you should first be tested for HIV. You should then be tested every 3 months for as long as you are taking PrEP.  PREGNANCY   If you are premenopausal and you may become pregnant, ask your health care provider about preconception counseling.  If you may  become pregnant, take 400 to 800 micrograms (mcg) of folic acid every day.  If you want to prevent pregnancy, talk to your health care provider about birth control (contraception). OSTEOPOROSIS AND MENOPAUSE   Osteoporosis is a disease in which the bones lose minerals and strength with aging. This can result in serious bone fractures. Your risk for osteoporosis can be identified using a bone density scan.  If you are 61 years of age or older, or if you are at risk for osteoporosis and fractures, ask your health care provider if you should be screened.  Ask your health care provider whether you should take a calcium or vitamin D supplement to lower your risk for osteoporosis.  Menopause may have certain physical symptoms and risks.  Hormone replacement therapy may reduce some of these symptoms and risks. Talk to your health care provider about whether hormone replacement therapy is right for you.  HOME CARE INSTRUCTIONS   Schedule regular health, dental, and eye exams.  Stay current with your immunizations.   Do not use any tobacco products including cigarettes, chewing tobacco, or electronic cigarettes.  If you are pregnant, do not drink alcohol.  If you are breastfeeding, limit how much and how often you drink alcohol.  Limit alcohol intake to no more than 1 drink per day for nonpregnant women. One drink equals 12 ounces of beer, 5 ounces of wine, or 1 ounces of hard liquor.  Do not use street drugs.  Do not share needles.  Ask your health care provider for help if  you need support or information about quitting drugs.  Tell your health care provider if you often feel depressed.  Tell your health care provider if you have ever been abused or do not feel safe at home.   This information is not intended to replace advice given to you by your health care provider. Make sure you discuss any questions you have with your health care provider.   Document Released: 06/30/2011 Document Revised: 01/05/2015 Document Reviewed: 11/16/2013 Elsevier Interactive Patient Education [employer].
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
## 2016-08-16 LAB — COMPREHENSIVE METABOLIC PANEL
ALBUMIN: 4.3 g/dL (ref 3.5–5.5)
ALK PHOS: 55 IU/L (ref 39–117)
ALT: 24 IU/L (ref 0–32)
AST: 16 IU/L (ref 0–40)
Albumin/Globulin Ratio: 2 (ref 1.2–2.2)
BUN / CREAT RATIO: 26 — AB (ref 9–23)
BUN: 19 mg/dL (ref 6–24)
Bilirubin Total: 0.4 mg/dL (ref 0.0–1.2)
CALCIUM: 9.6 mg/dL (ref 8.7–10.2)
CO2: 25 mmol/L (ref 18–29)
CREATININE: 0.72 mg/dL (ref 0.57–1.00)
Chloride: 96 mmol/L (ref 96–106)
GFR, EST AFRICAN AMERICAN: 107 mL/min/{1.73_m2} (ref >59)
GFR, EST NON AFRICAN AMERICAN: 93 mL/min/{1.73_m2} (ref >59)
GLUCOSE: 93 mg/dL (ref 65–99)
Globulin, Total: 2.2 g/dL (ref 1.5–4.5)
Potassium: 4 mmol/L (ref 3.5–5.2)
Sodium: 138 mmol/L (ref 134–144)
TOTAL PROTEIN: 6.5 g/dL (ref 6.0–8.5)

## 2016-08-16 LAB — CBC WITH DIFFERENTIAL/PLATELET
BASOS ABS: 0 10*3/uL (ref 0.0–0.2)
Basos: 0 %
EOS (ABSOLUTE): 0.4 10*3/uL (ref 0.0–0.4)
EOS: 5 %
HEMOGLOBIN: 12.9 g/dL (ref 11.1–15.9)
Hematocrit: 39.6 % (ref 34.0–46.6)
IMMATURE GRANS (ABS): 0 10*3/uL (ref 0.0–0.1)
IMMATURE GRANULOCYTES: 0 %
LYMPHS: 32 %
Lymphocytes Absolute: 2.6 10*3/uL (ref 0.7–3.1)
MCH: 28.8 pg (ref 26.6–33.0)
MCHC: 32.6 g/dL (ref 31.5–35.7)
MCV: 88 fL (ref 79–97)
MONOCYTES: 5 %
Monocytes Absolute: 0.4 10*3/uL (ref 0.1–0.9)
Neutrophils Absolute: 4.7 10*3/uL (ref 1.4–7.0)
Neutrophils: 58 %
Platelets: 251 10*3/uL (ref 150–379)
RBC: 4.48 x10E6/uL (ref 3.77–5.28)
RDW: 14.4 % (ref 12.3–15.4)
WBC: 8 10*3/uL (ref 3.4–10.8)

## 2016-08-16 LAB — TSH: TSH: 1.19 u[IU]/mL (ref 0.450–4.500)

## 2016-08-16 LAB — LIPID PANEL
CHOL/HDL RATIO: 4 ratio (ref 0.0–4.4)
Cholesterol, Total: 231 mg/dL — ABNORMAL HIGH (ref 100–199)
HDL: 58 mg/dL (ref >39)
LDL Calculated: 152 mg/dL — ABNORMAL HIGH (ref 0–99)
TRIGLYCERIDES: 107 mg/dL (ref 0–149)
VLDL Cholesterol Cal: 21 mg/dL (ref 5–40)

## 2016-08-16 LAB — HEMOGLOBIN A1C
Est. average glucose Bld gHb Est-mCnc: 134 mg/dL
HEMOGLOBIN A1C: 6.3 % — AB (ref 4.8–5.6)

## 2016-08-16 LAB — HEPATITIS C ANTIBODY: Hep C Virus Ab: <0.1 s/co ratio (ref 0.0–0.9)

## 2016-08-18 ENCOUNTER — Ambulatory Visit
Admission: RE | Admit: 2016-08-18 | Discharge: 2016-08-18 | Disposition: A | Discharge status: Home / Self Care | Payer: 59 | Source: Ambulatory Visit | Attending: Physician Assistant | Admitting: Physician Assistant

## 2016-08-18 ENCOUNTER — Other Ambulatory Visit: Payer: Self-pay | Admitting: Physician Assistant

## 2016-08-18 ENCOUNTER — Telehealth: Payer: Self-pay

## 2016-08-18 DIAGNOSIS — Z1239 Encounter for other screening for malignant neoplasm of breast: Secondary | ICD-10-CM

## 2016-08-18 DIAGNOSIS — Z1231 Encounter for screening mammogram for malignant neoplasm of breast: Secondary | ICD-10-CM

## 2016-08-18 NOTE — Telephone Encounter (Signed)
LMTCB 08/18/2016  Thanks,   -Laura  

## 2016-08-18 NOTE — Telephone Encounter (Signed)
-----   Message from Margaretann LovelessJennifer M Burnette, PA-C sent at 08/18/2016 11:10 AM EDT ----- Cholesterol and HgBA1c have both increased from previous labs. Recommend being very strict with healthy diet and continuing exercise. Limit fatty foods, carbs and sugars from diet. We will recheck in 6 months. If still increasing may require medications to help lower to prevent cardiovascular events.

## 2016-08-19 NOTE — Telephone Encounter (Signed)
LMTCB 08/19/2016  Thanks,   -Lucilla Petrenko  

## 2016-08-19 NOTE — Telephone Encounter (Signed)
Patient advised as directed below. Patient verbalized understanding.Pt is scheduled Feb 26 at 11 am.  Thanks,  -Joseline

## 2016-08-19 NOTE — Telephone Encounter (Signed)
-----   Message from Margaretann LovelessJennifer M Burnette, PA-C sent at 08/19/2016  8:55 AM EDT ----- Normal mammogram. Repeat screening in one year.

## 2016-08-20 LAB — PAP IG AND HPV HIGH-RISK
HPV, HIGH-RISK: NEGATIVE
PAP SMEAR COMMENT: 0

## 2016-08-22 ENCOUNTER — Telehealth: Payer: Self-pay

## 2016-08-22 NOTE — Telephone Encounter (Signed)
Pt advised of pap results. Agrees to repeat in one year. Allene DillonEmily Drozdowski, CMA

## 2016-09-03 ENCOUNTER — Telehealth: Payer: Self-pay | Admitting: Physician Assistant

## 2016-09-03 NOTE — Telephone Encounter (Signed)
Pt wants to know if we ban make a copy of the [facility] form that was completed when she came in for her wellness physical.  She also wants a copy of the labs and anything else she might need to turn into them for discount on her insurance.  She will come by and pick it up when it is ready.  Her call back is [phone]  Thanks Barth Kirkseri

## 2016-09-04 NOTE — Telephone Encounter (Signed)
Spoke with patient and she does have the form and labs. She reports what she needs os her BMI form to be fill out. Scheduled patient for an appointment.  Thanks,  -Santiel Topper

## 2016-09-09 ENCOUNTER — Telehealth: Payer: Self-pay | Admitting: Physician Assistant

## 2016-09-09 NOTE — Telephone Encounter (Signed)
Please review. JER 

## 2016-09-09 NOTE — Telephone Encounter (Signed)
Pt is needing an appeals form signed for [facility] in regards to her wellness exam. Pt is scheduled to come in for OV on 09/19/16 to discuss the form with DellekerJenni. Pt would like a call back to let her know if this requires the appt or can she fax the form to our office to complete. Please advise. Thanks TNP

## 2016-09-10 NOTE — Telephone Encounter (Signed)
LMTCB  Thanks,  -Isaura Schiller 

## 2016-09-10 NOTE — Telephone Encounter (Signed)
She can fax the form since I know it is for her weight loss and we are working on that.

## 2016-09-12 NOTE — Telephone Encounter (Signed)
Patient faxed the form to us and form is filled out and ready to contact patient to come and pick it up.  Thanks,  -Joseline

## 2016-09-12 NOTE — Telephone Encounter (Signed)
Spoke with pt and advised that form are ready. Discuss appt for 09/19/16. Pt request it to be canceled since forms are ready. Thanks TNP

## 2016-09-12 NOTE — Telephone Encounter (Signed)
After speaking with pt I saw the note form the nurse. I called pt back and LMTCB.Thanks TNP

## 2016-09-19 ENCOUNTER — Ambulatory Visit: Payer: Self-pay | Admitting: Physician Assistant

## 2016-10-16 ENCOUNTER — Encounter: Payer: Self-pay | Admitting: Family Medicine

## 2016-10-16 ENCOUNTER — Ambulatory Visit (INDEPENDENT_AMBULATORY_CARE_PROVIDER_SITE_OTHER): Payer: 59 | Admitting: Family Medicine

## 2016-10-16 VITALS — BP 122/74 | HR 64 | Temp 97.5°F | Resp 16 | Wt 260.0 lb

## 2016-10-16 DIAGNOSIS — M79605 Pain in left leg: Secondary | ICD-10-CM | POA: Diagnosis not present

## 2016-10-16 DIAGNOSIS — S46819A Strain of other muscles, fascia and tendons at shoulder and upper arm level, unspecified arm, initial encounter: Secondary | ICD-10-CM | POA: Diagnosis not present

## 2016-10-16 DIAGNOSIS — Z23 Encounter for immunization: Secondary | ICD-10-CM | POA: Diagnosis not present

## 2016-10-16 MED ORDER — CYCLOBENZAPRINE HCL 5 MG PO TABS: 5.0000 mg | ORAL_TABLET | Freq: Three times a day (TID) | ORAL | 1 refills | Status: DC | PRN

## 2016-10-16 NOTE — Progress Notes (Signed)
Subjective:     Patient ID: Angela PentaCynthia C Hodges, female   DOB: 1956-03-27, 60 y.o.   MRN: [id number]  HPI  Chief Complaint  Patient presents with  . Neck Pain    Patient comes in office today wth concerns of neck and shoulder pain for the past two weeks. Patient states in the morning she takes Nsaid for relief  due to pain. Patient states that " it feels like a rubber band stretching from neck to back".   . Leg Pain    Patient states that she has had pain on the left side of her leg gor the past couple weels, patient states that she has a history of arthritis and believes that it is much worse  States she spends her day at a computer. Reports using two pillows in bed to minimize reflux. Denies specific injury; continues to use the stationary bike for exercise. Has taken Aleve or ibuprofen in the Am for her sx as she will awake with them. Hx of cervical osteoarthritis.   Review of Systems     Objective:   Physical Exam  Constitutional: She appears well-developed and well-nourished. No distress.  Musculoskeletal: She exhibits no edema (of left lower extremity).  Tender over her posterior cervical L >R and upper trapezius areas. Increased discomfort with extension and left lateral movement. Localizes her leg discomfort to her left lateral calf area. No overlying erythema or knee swelling/tenderness. Muscle strength in lower extremities 5/5.Left SLR to 60 degrees without back pain or radiation of pain.No pain with left hip IR/ER.       Assessment:    1. Strain of trapezius muscle, unspecified laterality, initial encounter:  - cyclobenzaprine (FLEXERIL) 5 MG tablet; Take 1 tablet (5 mg total) by mouth 3 (three) times daily as needed for muscle spasms.  Dispense: 30 tablet; Refill: 1  2. Pain of left lower extremity  3. Need for influenza vaccination - Flu Vaccine QUAD 36+ mos PF IM (Fluarix & Fluzone Quad PF)    Plan:    Discussed HOB elevation and elimination of one pillow. She will work  on her ergonomics at work. Increase Aleve to two pills twice daily with food. Try muscle relaxant instead of sleep aid. Discussed and demonstrated calf stretches to do before bed.

## 2016-10-16 NOTE — Patient Instructions (Signed)
Increase Aleve to two pill twice daily with food. Try elevating head of bed to 6 inches and reduce number of pillows to one. Try stretching exercises for your calves before going to bed.

## 2016-12-04 ENCOUNTER — Other Ambulatory Visit: Payer: Self-pay | Admitting: Family Medicine

## 2016-12-04 ENCOUNTER — Encounter: Payer: Self-pay | Admitting: Family Medicine

## 2016-12-04 ENCOUNTER — Ambulatory Visit (INDEPENDENT_AMBULATORY_CARE_PROVIDER_SITE_OTHER): Payer: 59 | Admitting: Family Medicine

## 2016-12-04 VITALS — BP 114/70 | HR 76 | Temp 97.9°F | Resp 16 | Wt 256.2 lb

## 2016-12-04 DIAGNOSIS — B349 Viral infection, unspecified: Secondary | ICD-10-CM

## 2016-12-04 LAB — POC INFLUENZA A&B (BINAX/QUICKVUE)
Influenza A, POC: NEGATIVE
Influenza B, POC: NEGATIVE

## 2016-12-04 MED ORDER — HYDROCODONE-HOMATROPINE 5-1.5 MG/5ML PO SYRP: ORAL_SOLUTION | ORAL | 0 refills | Status: DC

## 2016-12-04 NOTE — Patient Instructions (Signed)
Discussed use of Mucinex D for congestion, Delsym for cough, and Benadryl for postnasal drainage 

## 2016-12-04 NOTE — Progress Notes (Addendum)
Subjective:     Patient ID: Angela Hodges, female   DOB: September 08, 1956, 60 y.o.   MRN: [id number]  HPI  Chief Complaint  Patient presents with  . Generalized Body Aches    Patient comes in office today with the following complaints: headache, cough, sneezing, body ache, sore throat, nausea, bilateral ear pain and since pain and pressure. Patient states that symptoms began 2 days ago and she has been taking Nyquil but has had no relief.   + flu shot this season. States sore throat is improving.   Review of Systems     Objective:   Physical Exam  Constitutional: She appears well-developed and well-nourished. No distress.  Ears: T.M's intact without inflammation Sinuses: non-tender Throat: no tonsillar enlargement or exudate Neck: no cervical adenopathy Lungs: clear     Assessment:    1. Acute viral syndrome - POC Influenza A&B(BINAX/QUICKVUE) - HYDROcodone-homatropine (HYCODAN) 5-1.5 MG/5ML syrup; 5 ml 4-6 hours as needed for cough  Dispense: 240 mL; Refill: 0    Plan:    Further f/u if not improving. Work excuse for 12/5-12/8/17

## 2016-12-10 ENCOUNTER — Other Ambulatory Visit: Payer: Self-pay | Admitting: Physician Assistant

## 2016-12-10 DIAGNOSIS — I10 Essential (primary) hypertension: Secondary | ICD-10-CM

## 2016-12-19 ENCOUNTER — Other Ambulatory Visit: Payer: Self-pay | Admitting: Family Medicine

## 2016-12-19 ENCOUNTER — Telehealth: Payer: Self-pay | Admitting: Family Medicine

## 2016-12-19 DIAGNOSIS — J40 Bronchitis, not specified as acute or chronic: Secondary | ICD-10-CM

## 2016-12-19 MED ORDER — DOXYCYCLINE HYCLATE 100 MG PO TABS
100.0000 mg | ORAL_TABLET | Freq: Two times a day (BID) | ORAL | 0 refills | Status: DC
Start: 2016-12-19 — End: 2017-04-03

## 2016-12-19 NOTE — Telephone Encounter (Signed)
Please advise. Thank you. sd 

## 2016-12-19 NOTE — Telephone Encounter (Signed)
Pt had an OV with Bob on 12/04/16 and has been taking OTC medications as directed. Pt stated that her throat is still very sore and has a lot of congestion. Pt would like an antibiotic sent to [facility] today if possible. Please advise. Thanks TNP

## 2016-12-19 NOTE — Telephone Encounter (Signed)
Reports persistent cough productive of light yellowish sputum. Throat remains sore due to coughing. Will send in doxycycline.

## 2017-01-30 ENCOUNTER — Ambulatory Visit (INDEPENDENT_AMBULATORY_CARE_PROVIDER_SITE_OTHER): Payer: 59 | Admitting: Physician Assistant

## 2017-01-30 ENCOUNTER — Encounter: Payer: Self-pay | Admitting: Physician Assistant

## 2017-01-30 VITALS — BP 130/82 | HR 76 | Temp 98.1°F | Resp 16 | Wt 256.4 lb

## 2017-01-30 DIAGNOSIS — L723 Sebaceous cyst: Secondary | ICD-10-CM | POA: Diagnosis not present

## 2017-01-30 DIAGNOSIS — K644 Residual hemorrhoidal skin tags: Secondary | ICD-10-CM | POA: Diagnosis not present

## 2017-01-30 MED ORDER — HYDROCORTISONE ACETATE 25 MG RE SUPP: 25.0000 mg | Freq: Two times a day (BID) | RECTAL | 0 refills | Status: DC

## 2017-01-30 NOTE — Progress Notes (Signed)
Patient: Angela Hodges Female    DOB: May 05, 1956   61 y.o.   MRN: [id number] Visit Date: 01/30/2017  Today's Provider: Margaretann Loveless, PA-C   Chief Complaint  Patient presents with  . Knot on vaginal area   Subjective:    HPI Patient is here today with c/o of a possible knot on the left side of her vaginal. She feels is under her skin. He comes and goes. She reports that today it is not bothering her. She reports it has been there for the past 6 months and is movable. She reports sometimes it feels bigger than other days. No pain, drainage, no fever. She said it only burn one time when she was washing herself.   She also reports that in the last few weeks she has notice this pinkish color when she wipes, but this morning she notice like a translucent red color in the toilet after she had a bowel movement. Last Colonoscopy was 12/28/15-Diverticulosis and non-bleeding Hemorrhoids. Repeat in 10 years. She does report that her bowel movements have been a little bit more hard than previously.    Allergies  Allergen Reactions  . Penicillins Hives  . Ranitidine Hcl Hives  . Sulfa Antibiotics Hives  . Tramadol Hcl Hives     Current Outpatient Prescriptions:  .  aspirin 81 MG tablet, Take 1 tablet by mouth daily., Disp: , Rfl:  .  Cholecalciferol 1000 UNITS capsule, Take 1 capsule by mouth 3 (three) times daily., Disp: , Rfl:  .  cyclobenzaprine (FLEXERIL) 5 MG tablet, Take 1 tablet (5 mg total) by mouth 3 (three) times daily as needed for muscle spasms., Disp: 30 tablet, Rfl: 1 .  fluticasone (FLONASE) 50 MCG/ACT nasal spray, Place into the nose., Disp: , Rfl:  .  Lorcaserin HCl (BELVIQ) 10 MG TABS, Take 1 tablet by mouth 2 (two) times daily., Disp: 180 tablet, Rfl: 0 .  naproxen (NAPROSYN) 500 MG tablet, TAKE ONE TABLET BY MOUTH TWICE A DAY WITH FOOD, Disp: 28 tablet, Rfl: 1 .  nystatin cream (MYCOSTATIN), Apply 1 application topically 2 (two) times daily., Disp: 30 g,  Rfl: 0 .  Omega-3 Fatty Acids (FISH OIL) 1200 MG CAPS, Take by mouth., Disp: , Rfl:  .  triamcinolone cream (KENALOG) 0.1 %, Reported on 12/28/2015, Disp: , Rfl:  .  valACYclovir (VALTREX) 1000 MG tablet, Take 1 tablet by mouth  daily, Disp: 90 tablet, Rfl: 3 .  valsartan-hydrochlorothiazide (DIOVAN-HCT) 160-25 MG tablet, TAKE 1 TABLET BY MOUTH  DAILY, Disp: 90 tablet, Rfl: 3 .  zolpidem (AMBIEN) 10 MG tablet, 1/2 to one at bedtime as needed for insomnia, Disp: 90 tablet, Rfl: 1 .  doxycycline (VIBRA-TABS) 100 MG tablet, Take 1 tablet (100 mg total) by mouth 2 (two) times daily. (Patient not taking: Reported on 01/30/2017), Disp: 20 tablet, Rfl: 0 .  HYDROcodone-homatropine (HYCODAN) 5-1.5 MG/5ML syrup, 5 ml 4-6 hours as needed for cough (Patient not taking: Reported on 01/30/2017), Disp: 240 mL, Rfl: 0 .  meloxicam (MOBIC) 15 MG tablet, Reported on 05/02/2016, Disp: , Rfl: 3  Review of Systems  Constitutional: Negative for fatigue and fever.  Respiratory: Negative for cough, chest tightness and shortness of breath.   Cardiovascular: Negative for chest pain, palpitations and leg swelling.  Gastrointestinal: Positive for anal bleeding and constipation. Negative for abdominal pain, diarrhea, nausea, rectal pain and vomiting.  Genitourinary: Positive for genital sores. Negative for dysuria, menstrual problem, pelvic pain and urgency.  Social History  Substance Use Topics  . Smoking status: Never Smoker  . Smokeless tobacco: Never Used  . Alcohol use No   Objective:   BP 130/82 (BP Location: Right Wrist, Patient Position: Sitting, Cuff Size: Normal)   Pulse 76   Temp 98.1 F (36.7 C) (Oral)   Resp 16   Wt 256 lb 6.4 oz (116.3 kg)   BMI 43.67 kg/m   Physical Exam  Constitutional: She appears well-developed and well-nourished. No distress.  Neck: Normal range of motion. Neck supple.  Cardiovascular: Normal rate, regular rhythm and normal heart sounds.  Exam reveals no gallop and no  friction rub.   No murmur heard. Pulmonary/Chest: Effort normal and breath sounds normal. No respiratory distress. She has no wheezes. She has no rales.  Genitourinary: Vagina normal. Rectal exam shows external hemorrhoid and internal hemorrhoid. Rectal exam shows no fissure, no mass, no tenderness and anal tone normal.    There is no rash, tenderness, lesion or injury on the right labia. There is no rash, tenderness, lesion or injury on the left labia.  Skin: She is not diaphoretic.  Vitals reviewed.      Assessment & Plan:     1. External hemorrhoids Nonthrombosed. Large posterior external hemorrhoid did have a small area that was irritated and raw looking most likely from wiping too hard. Anusol suppositories prescribed as below. Patient may use Preparation H in between severe episodes. Also discussed doing sitz baths in her bathtub with Epsom salts. Patient voiced understanding. Also discussed adding fiber and increasing water intake for bowel health. - hydrocortisone (ANUSOL-HC) 25 MG suppository; Place 1 suppository (25 mg total) rectally 2 (two) times daily.  Dispense: 12 suppository; Refill: 0  2. Sebaceous cyst Not irritated at this time. Advised patient that when she starts noticing the area is enlarging for her to perform sitz baths and warm compresses. If the area continues to enlarge and does not drain on its own patient is to call the office for consideration of I&D if necessary.       Margaretann Loveless, PA-C  [facility]

## 2017-01-30 NOTE — Patient Instructions (Signed)
How to Take a Sitz Bath A sitz bath is a warm water bath that is taken while you are sitting down. The water should only come up to your hips and should cover your buttocks. Your health care provider may recommend a sitz bath to help you:  Clean the lower part of your body, including your genital area.  With itching.  With pain.  With sore muscles or muscles that tighten or spasm. How to take a sitz bath Take 3-4 sitz baths per day or as told by your health care provider. 1. Partially fill a bathtub with warm water. You will only need the water to be deep enough to cover your hips and buttocks when you are sitting in it. 2. If your health care provider told you to put medicine in the water, follow the directions exactly. 3. Sit in the water and open the tub drain a little. 4. Turn on the warm water again to keep the tub at the correct level. Keep the water running constantly. 5. Soak in the water for 15-20 minutes or as told by your health care provider. 6. After the sitz bath, pat the affected area dry first. Do not rub it. 7. Be careful when you stand up after the sitz bath because you may feel dizzy. Contact a health care provider if:  Your symptoms get worse. Do not continue with sitz baths if your symptoms get worse.  You have new symptoms. Do not continue with sitz baths until you talk with your health care provider. This information is not intended to replace advice given to you by your health care provider. Make sure you discuss any questions you have with your health care provider. Document Released: 09/06/2004 Document Revised: 05/14/2016 Document Reviewed: 12/13/2014 Elsevier Interactive Patient Education  2017 [employer] Epidermal Cyst An epidermal cyst is usually a small, painless lump under the skin. Cysts often occur on the face, neck, stomach, chest, or genitals. The cyst may be filled with a bad smelling paste. Do not pop your cyst. Popping the cyst can cause pain and  puffiness (swelling). HOME CARE   Only take medicines as told by your doctor.  Take your medicine (antibiotics) as told. Finish it even if you start to feel better. GET HELP RIGHT AWAY IF:  Your cyst is tender, red, or puffy.  You are not getting better, or you are getting worse.  You have any questions or concerns. MAKE SURE YOU:  Understand these instructions.  Will watch your condition.  Will get help right away if you are not doing well or get worse. This information is not intended to replace advice given to you by your health care provider. Make sure you discuss any questions you have with your health care provider. Document Released: 01/22/2005 Document Revised: 06/15/2012 Document Reviewed: 10/17/2015 Elsevier Interactive Patient Education  2017 [employer] Hemorrhoids Introduction Hemorrhoids are swollen veins in and around the rectum or anus. Hemorrhoids can cause pain, itching, or bleeding. Most of the time, they do not cause serious problems. They usually get better with diet changes, lifestyle changes, and other home treatments. Follow these instructions at home: Eating and drinking  Eat foods that have fiber, such as whole grains, beans, nuts, fruits, and vegetables. Ask your doctor about taking products that have added fiber (fibersupplements).  Drink enough fluid to keep your pee (urine) clear or pale yellow. For Pain and Swelling  Take a warm-water bath (sitz bath) for 20 minutes to ease pain. Do  this 3-4 times a day.  If directed, put ice on the painful area. It may be helpful to use ice between your warm baths.  Put ice in a plastic bag.  Place a towel between your skin and the bag.  Leave the ice on for 20 minutes, 2-3 times a day. General instructions  Take over-the-counter and prescription medicines only as told by your doctor.  Medicated creams and medicines that are inserted into the anus (suppositories) may be used or applied as  told.  Exercise often.  Go to the bathroom when you have the urge to poop (to have a bowel movement). Do not wait.  Avoid pushing too hard (straining) when you poop.  Keep the butt area dry and clean. Use wet toilet paper or moist paper towels.  Do not sit on the toilet for a long time. Contact a doctor if:  You have any of these:  Pain and swelling that do not get better with treatment or medicine.  Bleeding that will not stop.  Trouble pooping or you cannot poop.  Pain or swelling outside the area of the hemorrhoids. This information is not intended to replace advice given to you by your health care provider. Make sure you discuss any questions you have with your health care provider. Document Released: 09/23/2008 Document Revised: 05/22/2016 Document Reviewed: 08/29/2015  2017 Elsevier

## 2017-02-06 ENCOUNTER — Other Ambulatory Visit: Payer: Self-pay

## 2017-02-06 DIAGNOSIS — G4700 Insomnia, unspecified: Secondary | ICD-10-CM

## 2017-02-06 MED ORDER — ZOLPIDEM TARTRATE 10 MG PO TABS: ORAL_TABLET | ORAL | 1 refills | Status: DC

## 2017-02-06 NOTE — Telephone Encounter (Signed)
Already called in. Allene DillonEmily Drozdowski, CMA

## 2017-02-06 NOTE — Telephone Encounter (Signed)
Pharmacy requesting refill Last ov 01/30/17 Last filled 07/24/16. Please review. Thank you. sd

## 2017-02-23 ENCOUNTER — Ambulatory Visit: Payer: Self-pay | Admitting: Physician Assistant

## 2017-03-11 ENCOUNTER — Ambulatory Visit: Payer: Self-pay | Admitting: Physician Assistant

## 2017-04-03 ENCOUNTER — Ambulatory Visit (INDEPENDENT_AMBULATORY_CARE_PROVIDER_SITE_OTHER): Payer: 59 | Admitting: Physician Assistant

## 2017-04-03 ENCOUNTER — Encounter: Payer: Self-pay | Admitting: Physician Assistant

## 2017-04-03 VITALS — BP 120/80 | HR 80 | Temp 97.7°F | Resp 16 | Ht 65.0 in | Wt 257.8 lb

## 2017-04-03 DIAGNOSIS — I10 Essential (primary) hypertension: Secondary | ICD-10-CM

## 2017-04-03 DIAGNOSIS — R7303 Prediabetes: Secondary | ICD-10-CM | POA: Diagnosis not present

## 2017-04-03 LAB — POCT GLYCOSYLATED HEMOGLOBIN (HGB A1C)
ESTIMATED AVERAGE GLUCOSE: 137
HEMOGLOBIN A1C: 6.4

## 2017-04-03 NOTE — Progress Notes (Signed)
Patient: Angela Hodges Female    DOB: [birth year]   61 y.o.   MRN: [id number] Visit Date: 04/03/2017  Today's Provider: Margaretann Loveless, PA-C   Chief Complaint  Patient presents with  . Hyperglycemia  . Hyperlipidemia   Subjective:    HPI  Prediabetes, Follow-up:   Lab Results  Component Value Date   HGBA1C 6.4 04/03/2017   HGBA1C 6.3 (H) 08/15/2016   HGBA1C 6.1 12/07/2015   GLUCOSE 93 08/15/2016   GLUCOSE 97 08/17/2015   GLUCOSE 98 07/25/2014    Last seen for for this6 months ago.  Management since that visit includes work on diet and exercise. Current symptoms include none and have been stable.  Weight trend: stable Prior visit with dietician: no Current diet: in general, a "healthy" diet   Current exercise: walking  Pertinent Labs:    Component Value Date/Time   CHOL 231 (H) 08/15/2016 1518   TRIG 107 08/15/2016 1518   CHOLHDL 4.0 08/15/2016 1518   CREATININE 0.72 08/15/2016 1518   CREATININE 0.59 (L) 07/25/2014 1142    Wt Readings from Last 3 Encounters:  04/03/17 257 lb 12.8 oz (116.9 kg)  01/30/17 256 lb 6.4 oz (116.3 kg)  12/04/16 256 lb 3.2 oz (116.2 kg)    Lipid/Cholesterol, Follow-up:   Last seen for this 6 months ago.  Management changes since that visit include work on diet and exercise. . Last Lipid Panel:    Component Value Date/Time   CHOL 231 (H) 08/15/2016 1518   TRIG 107 08/15/2016 1518   HDL 58 08/15/2016 1518   CHOLHDL 4.0 08/15/2016 1518   LDLCALC 152 (H) 08/15/2016 1518    Risk factors for vascular disease include diabetes mellitus and hypercholesterolemia  She reports excellent compliance with treatment. She is not having side effects.  Current symptoms include none and have been stable. Weight trend: stable Prior visit with dietician: no Current diet: in general, a "healthy" diet   Current exercise: walking  Wt Readings from Last 3 Encounters:  04/03/17 257 lb 12.8 oz (116.9 kg)  01/30/17 256 lb 6.4  oz (116.3 kg)  12/04/16 256 lb 3.2 oz (116.2 kg)   -------------------------------------------------------------------     Allergies  Allergen Reactions  . Penicillins Hives  . Ranitidine Hcl Hives  . Sulfa Antibiotics Hives  . Tramadol Hcl Hives     Current Outpatient Prescriptions:  .  aspirin 81 MG tablet, Take 1 tablet by mouth daily., Disp: , Rfl:  .  Cholecalciferol 1000 UNITS capsule, Take 1 capsule by mouth 3 (three) times daily., Disp: , Rfl:  .  hydrocortisone (ANUSOL-HC) 25 MG suppository, Place 1 suppository (25 mg total) rectally 2 (two) times daily., Disp: 12 suppository, Rfl: 0 .  naproxen (NAPROSYN) 500 MG tablet, TAKE ONE TABLET BY MOUTH TWICE A DAY WITH FOOD, Disp: 28 tablet, Rfl: 1 .  nystatin cream (MYCOSTATIN), Apply 1 application topically 2 (two) times daily., Disp: 30 g, Rfl: 0 .  Omega-3 Fatty Acids (FISH OIL) 1200 MG CAPS, Take by mouth., Disp: , Rfl:  .  triamcinolone cream (KENALOG) 0.1 %, Reported on 12/28/2015, Disp: , Rfl:  .  valACYclovir (VALTREX) 1000 MG tablet, Take 1 tablet by mouth  daily, Disp: 90 tablet, Rfl: 3 .  valsartan-hydrochlorothiazide (DIOVAN-HCT) 160-25 MG tablet, TAKE 1 TABLET BY MOUTH  DAILY, Disp: 90 tablet, Rfl: 3 .  zolpidem (AMBIEN) 10 MG tablet, 1/2 to one at bedtime as needed for insomnia, Disp: 90 tablet, Rfl:  1  Review of Systems  Constitutional: Negative.   Respiratory: Negative.   Cardiovascular: Negative.   Gastrointestinal: Negative.   Endocrine: Negative.   Neurological: Negative.     Social History  Substance Use Topics  . Smoking status: Never Smoker  . Smokeless tobacco: Never Used  . Alcohol use No   Objective:   BP 120/80 (BP Location: Right Arm, Patient Position: Sitting, Cuff Size: Large)   Pulse 80   Temp 97.7 F (36.5 C) (Oral)   Resp 16   Ht 5\' 5"  (1.651 m)   Wt 257 lb 12.8 oz (116.9 kg)   SpO2 99%   BMI 42.90 kg/m  Vitals:   04/03/17 1144  BP: 120/80  Pulse: 80  Resp: 16  Temp: 97.7  F (36.5 C)  TempSrc: Oral  SpO2: 99%  Weight: 257 lb 12.8 oz (116.9 kg)  Height: 5\' 5"  (1.651 m)     Physical Exam  Constitutional: She appears well-developed and well-nourished. No distress.  Neck: Normal range of motion. Neck supple. No JVD present. Carotid bruit is not present. No tracheal deviation present. No thyromegaly present.  Cardiovascular: Normal rate, regular rhythm and normal heart sounds.  Exam reveals no gallop and no friction rub.   No murmur heard. Pulmonary/Chest: Effort normal and breath sounds normal. No respiratory distress. She has no wheezes. She has no rales.  Lymphadenopathy:    She has no cervical adenopathy.  Skin: She is not diaphoretic.  Vitals reviewed.     Assessment & Plan:     1. Borderline diabetes A1c increased to 6.4. She is starting a walking program with her sister-in-law and is going to restart [employer] wat[employer]. She is very motivated for weight loss. I will see her back in 4 months for her CPE. - POCT glycosylated hemoglobin (Hb A1C)  2. Essential (primary) hypertension Stable. Continue valsartan-hctz 160-25mg .        Margaretann Loveless, PA-C  [facility]

## 2017-04-03 NOTE — Patient Instructions (Signed)
Carbohydrate Counting for Diabetes Mellitus, Adult Carbohydrate counting is a method for keeping track of how many carbohydrates you eat. Eating carbohydrates naturally increases the amount of sugar (glucose) in the blood. Counting how many carbohydrates you eat helps keep your blood glucose within normal limits, which helps you manage your diabetes (diabetes mellitus). It is important to know how many carbohydrates you can safely have in each meal. This is different for every person. A diet and nutrition specialist (registered dietitian) can help you make a meal plan and calculate how many carbohydrates you should have at each meal and snack. Carbohydrates are found in the following foods:  Grains, such as breads and cereals.  Dried beans and soy products.  Starchy vegetables, such as potatoes, peas, and corn.  Fruit and fruit juices.  Milk and yogurt.  Sweets and snack foods, such as cake, cookies, candy, chips, and soft drinks. How do I count carbohydrates? There are two ways to count carbohydrates in food. You can use either of the methods or a combination of both. Reading "Nutrition Facts" on packaged food  The "Nutrition Facts" list is included on the labels of almost all packaged foods and beverages in the [place] It includes:  The serving size.  Information about nutrients in each serving, including the grams (g) of carbohydrate per serving. To use the "Nutrition Facts":  Decide how many servings you will have.  Multiply the number of servings by the number of carbohydrates per serving.  The resulting number is the total amount of carbohydrates that you will be having. Learning standard serving sizes of other foods  When you eat foods containing carbohydrates that are not packaged or do not include "Nutrition Facts" on the label, you need to measure the servings in order to count the amount of carbohydrates:  Measure the foods that you will eat with a food scale or measuring  cup, if needed.  Decide how many standard-size servings you will eat.  Multiply the number of servings by 15. Most carbohydrate-rich foods have about 15 g of carbohydrates per serving.  For example, if you eat 8 oz (170 g) of strawberries, you will have eaten 2 servings and 30 g of carbohydrates (2 servings x 15 g = 30 g).  For foods that have more than one food mixed, such as soups and casseroles, you must count the carbohydrates in each food that is included. The following list contains standard serving sizes of common carbohydrate-rich foods. Each of these servings has about 15 g of carbohydrates:   hamburger bun or  English muffin.   oz (15 mL) syrup.   oz (14 g) jelly.  1 slice of bread.  1 six-inch tortilla.  3 oz (85 g) cooked rice or pasta.  4 oz (113 g) cooked dried beans.  4 oz (113 g) starchy vegetable, such as peas, corn, or potatoes.  4 oz (113 g) hot cereal.  4 oz (113 g) mashed potatoes or  of a large baked potato.  4 oz (113 g) canned or frozen fruit.  4 oz (120 mL) fruit juice.  4-6 crackers.  6 chicken nuggets.  6 oz (170 g) unsweetened dry cereal.  6 oz (170 g) plain fat-free yogurt or yogurt sweetened with artificial sweeteners.  8 oz (240 mL) milk.  8 oz (170 g) fresh fruit or one small piece of fruit.  24 oz (680 g) popped popcorn. Example of carbohydrate counting Sample meal  3 oz (85 g) chicken breast.  6 oz (  170 g) brown rice.  4 oz (113 g) corn.  8 oz (240 mL) milk.  8 oz (170 g) strawberries with sugar-free whipped topping. Carbohydrate calculation 1. Identify the foods that contain carbohydrates:  Rice.  Corn.  Milk.  Strawberries. 2. Calculate how many servings you have of each food:  2 servings rice.  1 serving corn.  1 serving milk.  1 serving strawberries. 3. Multiply each number of servings by 15 g:  2 servings rice x 15 g = 30 g.  1 serving corn x 15 g = 15 g.  1 serving milk x 15 g = 15  g.  1 serving strawberries x 15 g = 15 g. 4. Add together all of the amounts to find the total grams of carbohydrates eaten:  30 g + 15 g + 15 g + 15 g = 75 g of carbohydrates total. This information is not intended to replace advice given to you by your health care provider. Make sure you discuss any questions you have with your health care provider. Document Released: 12/15/2005 Document Revised: 07/04/2016 Document Reviewed: 05/28/2016 Elsevier Interactive Patient Education  2017 Elsevier Inc.  

## 2017-04-06 ENCOUNTER — Ambulatory Visit: Payer: Self-pay | Admitting: Physician Assistant

## 2017-04-08 ENCOUNTER — Ambulatory Visit: Payer: Self-pay | Admitting: Physician Assistant

## 2017-05-14 ENCOUNTER — Ambulatory Visit: Payer: 59 | Admitting: Family Medicine

## 2017-05-14 ENCOUNTER — Telehealth: Payer: Self-pay

## 2017-05-14 NOTE — Telephone Encounter (Signed)
Pt called c/o left arm numbness and neck pain. Pt denies chest pain, palpitations, N/V, SOB. Pt has a H/O TIAs. Appointment made for 2:00 today. Pt advised to go to ER if sx worsen or MI sx occur. Allene DillonEmily Drozdowski, CMA

## 2017-05-28 ENCOUNTER — Ambulatory Visit (INDEPENDENT_AMBULATORY_CARE_PROVIDER_SITE_OTHER): Payer: 59 | Admitting: Family Medicine

## 2017-05-28 ENCOUNTER — Encounter: Payer: Self-pay | Admitting: Family Medicine

## 2017-05-28 VITALS — BP 118/82 | HR 87 | Temp 98.3°F | Resp 16 | Wt 259.0 lb

## 2017-05-28 DIAGNOSIS — M47812 Spondylosis without myelopathy or radiculopathy, cervical region: Secondary | ICD-10-CM

## 2017-05-28 DIAGNOSIS — M5412 Radiculopathy, cervical region: Secondary | ICD-10-CM

## 2017-05-28 MED ORDER — PREDNISONE 20 MG PO TABS: ORAL_TABLET | ORAL | 0 refills | Status: DC

## 2017-05-28 NOTE — Patient Instructions (Addendum)
Let me know if not improving and we will send you to orthopedics. Don't take Aleve or ibuprofen while on prednisone. Do take tylenol as needed.

## 2017-05-28 NOTE — Progress Notes (Signed)
Subjective:     Patient ID: Angela Hodges, female   DOB: Aug 28, 1956, 61 y.o.   MRN: [id number]  HPI  Chief Complaint  Patient presents with  . Arm Pain    Patient comes in office today with complaints of arm pain for over 2 weeks. Patient stated that pain initially felt like it was in neck and shooted down to her elbow. Patient reports today that shooting pain is now radiating down to her hand, she reports tingling and numbness of left hand.   No change in strength or recent injury. Has hx of multi-level cervical DJD. States she took Aleve 3 x day for a couple of days without improvement.   Review of Systems     Objective:   Physical Exam  Constitutional: She appears well-developed and well-nourished. No distress.  Musculoskeletal:  Grips, left EE/EF/shoulder strength 5/5. She can fully flex her shoulder without significant discomfort. Cervical ROM mildly decreased. Reports increased pain and "pop" with flexion and left lateral movement.       Assessment:    1. Cervical radiculopathy - predniSONE (DELTASONE) 20 MG tablet; Taper as follows: 3 pills for 4 days, two pills for 4 days, one pill for four days  Dispense: 24 tablet; Refill: 0  2. Osteoarthritis of cervical spine, unspecified spinal osteoarthritis complication status    Plan:    Refer to orthopedics if not improved with prednisone.

## 2017-06-11 ENCOUNTER — Ambulatory Visit
Admission: RE | Admit: 2017-06-11 | Discharge: 2017-06-11 | Disposition: A | Discharge status: Home / Self Care | Payer: 59 | Source: Ambulatory Visit | Attending: Family Medicine | Admitting: Family Medicine

## 2017-06-11 ENCOUNTER — Ambulatory Visit (INDEPENDENT_AMBULATORY_CARE_PROVIDER_SITE_OTHER): Payer: 59 | Admitting: Family Medicine

## 2017-06-11 ENCOUNTER — Encounter: Payer: Self-pay | Admitting: Family Medicine

## 2017-06-11 VITALS — BP 106/66 | HR 80 | Temp 98.4°F | Resp 16

## 2017-06-11 DIAGNOSIS — M5412 Radiculopathy, cervical region: Secondary | ICD-10-CM | POA: Diagnosis not present

## 2017-06-11 DIAGNOSIS — M50322 Other cervical disc degeneration at C5-C6 level: Secondary | ICD-10-CM | POA: Diagnosis not present

## 2017-06-11 NOTE — Patient Instructions (Signed)
Try ibuprofen 800 mg. 3 x day with meals pending orthopedic referral.

## 2017-06-11 NOTE — Progress Notes (Signed)
Subjective:     Patient ID: Angela Hodges, femJeris Pentaale   DOB: 1956/01/23, 61 y.o.   MRN: [id number]  HPI  Chief Complaint  Patient presents with  . Neck Pain    Patient returns to office today for follow up, last office visit was 05/28/17 for cervical radiculopathy patient was prescribed prednisone 20mg . Patient states that she d/c steroid because it made her feel funny. Patient states that she has been taking otc Advil. Yesterday patient states that she had a headache start on the left side at base of neck radiating down to arm, patient decribed pain as throbbing and states tingling/numbness is still present.   States she never initiated the prednisone fearing side effects. Tried two Aleve with little improvement and will initiate ibuprofen today. Prior C-spine x-ray 2014 with multi-level degenerative spurring.   Review of Systems     Objective:   Physical Exam  Constitutional: She appears well-developed and well-nourished. No distress.       Assessment:    1. Cervical radiculopathy - Ambulatory referral to Orthopedic Surgery - DG Cervical Spine Complete; Future    Plan:    Further f/u pending x-ray report. Start ibuprofen 800 mg 3 x day with food.

## 2017-07-06 ENCOUNTER — Other Ambulatory Visit: Payer: Self-pay

## 2017-07-06 DIAGNOSIS — G4700 Insomnia, unspecified: Secondary | ICD-10-CM

## 2017-07-06 MED ORDER — ZOLPIDEM TARTRATE 10 MG PO TABS: ORAL_TABLET | ORAL | 1 refills | Status: DC

## 2017-07-06 NOTE — Telephone Encounter (Signed)
Pharmacy requesting refill on Ambien. Last ov 06/11/17  Last filled 02/06/17 Please review. Thank you. sd

## 2017-07-07 NOTE — Telephone Encounter (Signed)
Called in Rx as below.  

## 2017-08-04 ENCOUNTER — Other Ambulatory Visit: Payer: Self-pay | Admitting: Physician Assistant

## 2017-08-04 DIAGNOSIS — A601 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: Secondary | ICD-10-CM

## 2017-08-14 ENCOUNTER — Encounter: Payer: Self-pay | Admitting: Physician Assistant

## 2017-08-17 ENCOUNTER — Encounter: Payer: Self-pay | Admitting: Physician Assistant

## 2017-08-21 ENCOUNTER — Encounter: Payer: 59 | Admitting: Physician Assistant

## 2017-10-16 ENCOUNTER — Ambulatory Visit (INDEPENDENT_AMBULATORY_CARE_PROVIDER_SITE_OTHER): Payer: 59 | Admitting: Family Medicine

## 2017-10-16 ENCOUNTER — Encounter: Payer: Self-pay | Admitting: Family Medicine

## 2017-10-16 VITALS — BP 124/88 | HR 80 | Temp 98.1°F | Resp 16 | Ht 64.5 in | Wt 262.4 lb

## 2017-10-16 DIAGNOSIS — Z23 Encounter for immunization: Secondary | ICD-10-CM

## 2017-10-16 DIAGNOSIS — Z6841 Body Mass Index (BMI) 40.0 and over, adult: Secondary | ICD-10-CM | POA: Diagnosis not present

## 2017-10-16 MED ORDER — PHENTERMINE HCL 37.5 MG PO CAPS: 37.5000 mg | ORAL_CAPSULE | ORAL | 0 refills | Status: DC

## 2017-10-16 NOTE — Progress Notes (Signed)
Subjective:     Patient ID: Jeris PentaCynthia C Desroches, female   DOB: Oct 28, 1956, 61 y.o.   MRN: [id number]  HPI  Chief Complaint  Patient presents with  . Employment Physical    Patient presents in office today with biometric form to address her BMI.   States she needs to document another weight loss plan.She is pending full physical with Ms.Burnette later this Fall. 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 She is also pending further orthopedic evaluation. She states she "loves to eat" esp bread and agrees to try another appetite suppressant (reports Belviq increased her hunger). Bp is under control so will try phentermine.   Review of Systems     Objective:   Physical Exam  Constitutional: She appears well-developed and well-nourished. No distress.       Assessment:    1. Need for influenza vaccination - Flu Vaccine QUAD 36+ mos IM  2. BMI 40.0-44.9, adult [facility](HCC): initial BMI goal of 42 (approx. 5% of current weight). - phentermine 37.5 MG capsule; Take 1 capsule (37.5 mg total) by mouth every morning.  Dispense: 30 capsule; Refill: 0    Plan:    F/u in one month for weight and bp check.

## 2017-10-16 NOTE — Patient Instructions (Signed)
Follow up in one month for bp and weight check. Do schedule your mammogram and full physical with Ms. Rosezetta SchlatterBurnette.

## 2017-10-20 ENCOUNTER — Telehealth: Payer: Self-pay | Admitting: Family Medicine

## 2017-10-20 NOTE — Telephone Encounter (Signed)
[id number] 

## 2017-10-20 NOTE — Telephone Encounter (Signed)
Pt is requesting to speak with Kat. Pt stated that she had some questions about her health screening form she dropped off on 10/19/17. Please advise. Thanks TNP

## 2017-10-21 NOTE — Telephone Encounter (Signed)
[id number] 

## 2017-10-22 ENCOUNTER — Other Ambulatory Visit: Payer: Self-pay | Admitting: Physician Assistant

## 2017-10-22 DIAGNOSIS — Z1231 Encounter for screening mammogram for malignant neoplasm of breast: Secondary | ICD-10-CM

## 2017-10-27 ENCOUNTER — Other Ambulatory Visit: Payer: Self-pay | Admitting: Physician Assistant

## 2017-10-27 DIAGNOSIS — I10 Essential (primary) hypertension: Secondary | ICD-10-CM

## 2017-10-28 ENCOUNTER — Other Ambulatory Visit: Payer: Self-pay | Admitting: Physician Assistant

## 2017-10-28 DIAGNOSIS — G4700 Insomnia, unspecified: Secondary | ICD-10-CM

## 2017-10-28 MED ORDER — ZOLPIDEM TARTRATE 10 MG PO TABS: ORAL_TABLET | ORAL | 1 refills | Status: DC

## 2017-10-28 NOTE — Telephone Encounter (Signed)
May need to call patient to see if she wants to continue since valsartan is on recall due to having carcinogen.

## 2017-10-28 NOTE — Telephone Encounter (Signed)
[facility] faxed a request for the following medication. Thanks CC ° ° °zolpidem (AMBIEN) 10 MG tablet  ° °

## 2017-10-30 ENCOUNTER — Other Ambulatory Visit: Payer: Self-pay | Admitting: Physician Assistant

## 2017-10-30 DIAGNOSIS — I10 Essential (primary) hypertension: Secondary | ICD-10-CM

## 2017-10-30 MED ORDER — LOSARTAN POTASSIUM-HCTZ 100-25 MG PO TABS: 1.0000 | ORAL_TABLET | Freq: Every day | ORAL | 1 refills | Status: DC

## 2017-10-30 NOTE — Telephone Encounter (Signed)
Will send in losarrtan-hctz 100-25mg . She may wish to return to the office in 1-2 months to recheck BP. She can also check at home and call the office if readings are high (>140/90).

## 2017-10-30 NOTE — Telephone Encounter (Signed)
Spoke with patient. No she does not want to take this medication if there was a recall on this. She would like to take something else. Patient advised I would let you know and another RX will be sent to her pharmacy. Please review-Lailyn Appelbaum V Raynell Scott, RMA

## 2017-10-30 NOTE — Telephone Encounter (Signed)
There is a note for patient to be called to make sure she wants to continue this medication as it had been recalled. Patient can call [facility] to see if this medication is included in the recall for valsartan due to having known cancer causing agent.

## 2017-10-30 NOTE — Telephone Encounter (Signed)
I sent another message about this, there was a separate message about this-Latayvia Mandujano Ander PurpuraV Nikeshia Keetch, RMA

## 2017-10-30 NOTE — Telephone Encounter (Signed)
[facility] faxed a request for the following medication. Thanks CC  valsartan-hydrochlorothiazide (DIOVAN-HCT) 160-25 MG tablet

## 2017-11-02 ENCOUNTER — Other Ambulatory Visit: Payer: Self-pay

## 2017-11-02 DIAGNOSIS — I10 Essential (primary) hypertension: Secondary | ICD-10-CM

## 2017-11-02 MED ORDER — LOSARTAN POTASSIUM-HCTZ 100-25 MG PO TABS: 1.0000 | ORAL_TABLET | Freq: Every day | ORAL | 0 refills | Status: DC

## 2017-11-02 NOTE — Telephone Encounter (Signed)
Patient request to 30 day supply of losartan sent to local pharmacy [facility].

## 2017-11-18 ENCOUNTER — Encounter: Payer: Self-pay | Admitting: Physician Assistant

## 2017-11-18 ENCOUNTER — Ambulatory Visit: Payer: 59 | Admitting: Physician Assistant

## 2017-11-18 VITALS — BP 132/64 | HR 76 | Temp 97.7°F | Resp 16

## 2017-11-18 DIAGNOSIS — M5412 Radiculopathy, cervical region: Secondary | ICD-10-CM | POA: Diagnosis not present

## 2017-11-18 MED ORDER — GABAPENTIN 300 MG PO CAPS: ORAL_CAPSULE | ORAL | 3 refills | Status: DC

## 2017-11-18 NOTE — Progress Notes (Signed)
Patient: Angela Hodges Female    DOB: 09/26/1956   61 y.o.   MRN: [id number] Visit Date: 11/23/2017  Today's Provider: Trey Sailors, PA-C   Chief Complaint  Patient presents with  . Numbness    in left arm   Subjective:    HPI Angela Hodges is a 61 y/o woman with history of cervical DDD who reports that she has been having numbness and tingling in her left arm for about 3 weeks now. She reports that she has pain in her left shoulder and hurts to lift her arm. Denies any chest pain, shortness of breath, weakness, headache, dizziness, or light headedness. She is unsure if she has ever had anything like this in the past. She reports that she has arthritis in her neck but is on the right side. She reports that the pain woke her up last night. " I feel like I have something crawling in my arm. She denies weakness in her arm. The right arm is fine.       Allergies  Allergen Reactions  . Penicillins Hives  . Ranitidine Hcl Hives  . Sulfa Antibiotics Hives  . Tramadol Hcl Hives     Current Outpatient Medications:  .  aspirin 81 MG tablet, Take 1 tablet by mouth daily., Disp: , Rfl:  .  Cholecalciferol 1000 UNITS capsule, Take 1 capsule by mouth 3 (three) times daily., Disp: , Rfl:  .  losartan-hydrochlorothiazide (HYZAAR) 100-25 MG tablet, Take 1 tablet daily by mouth., Disp: 30 tablet, Rfl: 0 .  meloxicam (MOBIC) 15 MG tablet, meloxicam 15 mg tablet  TAKE ONE TABLET BY MOUTH EVERY DAY, Disp: , Rfl:  .  Omega-3 Fatty Acids (FISH OIL) 1200 MG CAPS, Take by mouth., Disp: , Rfl:  .  valACYclovir (VALTREX) 1000 MG tablet, TAKE 1 TABLET BY MOUTH  DAILY, Disp: 90 tablet, Rfl: 3 .  zolpidem (AMBIEN) 10 MG tablet, 1/2 to one at bedtime as needed for insomnia, Disp: 90 tablet, Rfl: 1 .  gabapentin (NEURONTIN) 300 MG capsule, May take one pill at night x 1 week. May increase to one pill twice daily on week 2. May increase to one pill three times daily on week 3., Disp: 90  capsule, Rfl: 3 .  nystatin cream (MYCOSTATIN), Apply 1 application topically 2 (two) times daily. (Patient not taking: Reported on 10/16/2017), Disp: 30 g, Rfl: 0 .  phentermine 37.5 MG capsule, Take 1 capsule (37.5 mg total) by mouth every morning. (Patient not taking: Reported on 11/18/2017), Disp: 30 capsule, Rfl: 0  Review of Systems  Constitutional: Negative.   HENT: Negative.   Eyes: Negative.   Respiratory: Negative.   Cardiovascular: Negative.   Gastrointestinal: Negative.   Endocrine: Negative.   Genitourinary: Negative.   Musculoskeletal: Negative.   Skin: Negative.   Allergic/Immunologic: Negative.   Neurological: Positive for numbness.  Hematological: Negative.   Psychiatric/Behavioral: Negative.     Social History   Tobacco Use  . Smoking status: Never Smoker  . Smokeless tobacco: Never Used  Substance Use Topics  . Alcohol use: No   Objective:   BP 132/64 (BP Location: Right Arm, Patient Position: Sitting, Cuff Size: Large)   Pulse 76   Temp 97.7 F (36.5 C) (Oral)   Resp 16   SpO2 98%  Vitals:   11/18/17 1611  BP: 132/64  Pulse: 76  Resp: 16  Temp: 97.7 F (36.5 C)  TempSrc: Oral  SpO2: 98%  Physical Exam  Constitutional: She is oriented to person, place, and time. She appears well-developed and well-nourished.  Cardiovascular: Normal rate and regular rhythm.  Pulmonary/Chest: Effort normal and breath sounds normal.  Musculoskeletal: She exhibits no edema or tenderness.       Cervical back: She exhibits decreased range of motion and pain. She exhibits no tenderness, no bony tenderness and no edema.  Left shoulder limited abduction. No obvious muscle atrophy. No point tenderness along scapula, clavicle, or AC joint. Left upper extremity strength normal except for biceps, 4/5. Spurling negative.  Neurological: She is alert and oriented to person, place, and time. She displays no atrophy. No sensory deficit. She exhibits normal muscle tone.    Reflex Scores:      Bicep reflexes are 2+ on the right side and 2+ on the left side. Skin: Skin is warm and dry.  Psychiatric: She has a normal mood and affect. Her behavior is normal.        Assessment & Plan:     1. Cervical radiculopathy  Reviewed significant cervical DDD on C-spine xray from 06/11/2017 which shows anterolisthesis on C4-C5 & C7-T1 as well as left sided facet arthrosis and disc space narrowing at C5-C6. Also shows anterior spurring. I have personally reviewed this image and agree with the findings. Explained to patient that degenerative changes in neck are likely causing compression of nerves that run through her arm. Reassured that I think MI and stroke are unlikely. Counseled on different types of conservative treatments, and she agrees to try gabapentin. Should separate from Ambien by 2+ hours. Instructed that if this progresses to significant hand or arm weakness, she may need surgery.   - gabapentin (NEURONTIN) 300 MG capsule; May take one pill at night x 1 week. May increase to one pill twice daily on week 2. May increase to one pill three times daily on week 3.  Dispense: 90 capsule; Refill: 3  Return if symptoms worsen or fail to improve.  The entirety of the information documented in the History of Present Illness, Review of Systems and Physical Exam were personally obtained by me. Portions of this information were initially documented by [place], CMA and reviewed by me for thoroughness and accuracy.            Trey Sailors, PA-C  [facility]

## 2017-11-18 NOTE — Patient Instructions (Signed)
Please separate gabapentin from [place] by 4 hours.   Cervical Radiculopathy Cervical radiculopathy means that a nerve in the neck is pinched or bruised. This can cause pain or loss of feeling (numbness) that runs from your neck to your arm and fingers. Follow these instructions at home: Managing pain  Take over-the-counter and prescription medicines only as told by your doctor.  If directed, put ice on the injured or painful area. ? Put ice in a plastic bag. ? Place a towel between your skin and the bag. ? Leave the ice on for 20 minutes, 2-3 times per day.  If ice does not help, you can try using heat. Take a warm shower or warm bath, or use a heat pack as told by your doctor.  You may try a gentle neck and shoulder massage. Activity  Rest as needed. Follow instructions from your doctor about any activities to avoid.  Do exercises as told by your doctor or physical therapist. General instructions  If you were given a soft collar, wear it as told by your doctor.  Use a flat pillow when you sleep.  Keep all follow-up visits as told by your doctor. This is important. Contact a doctor if:  Your condition does not improve with treatment. Get help right away if:  Your pain gets worse and is not controlled with medicine.  You lose feeling or feel weak in your hand, arm, face, or leg.  You have a fever.  You have a stiff neck.  You cannot control when you poop or pee (have incontinence).  You have trouble with walking, balance, or talking. This information is not intended to replace advice given to you by your health care provider. Make sure you discuss any questions you have with your health care provider. Document Released: 12/04/2011 Document Revised: 05/22/2016 Document Reviewed: 02/08/2015 Elsevier Interactive Patient Education  [employer]2018 [employer]

## 2017-11-26 ENCOUNTER — Ambulatory Visit
Admission: RE | Admit: 2017-11-26 | Discharge: 2017-11-26 | Disposition: A | Discharge status: Home / Self Care | Payer: 59 | Source: Ambulatory Visit | Attending: Physician Assistant | Admitting: Physician Assistant

## 2017-11-26 DIAGNOSIS — Z1231 Encounter for screening mammogram for malignant neoplasm of breast: Secondary | ICD-10-CM | POA: Insufficient documentation

## 2017-11-27 ENCOUNTER — Encounter: Payer: 59 | Admitting: Physician Assistant

## 2017-11-27 ENCOUNTER — Telehealth: Payer: Self-pay

## 2017-11-27 NOTE — Telephone Encounter (Signed)
-----   Message from Jennifer M Burnette, PA-C sent at 11/27/2017 12:02 PM EST ----- Normal mammogram. Repeat screening in one year. 

## 2017-11-27 NOTE — Telephone Encounter (Signed)
Patient advised as directed below.  Thanks,  -Benelli Winther 

## 2017-12-17 ENCOUNTER — Encounter: Payer: 59 | Admitting: Physician Assistant

## 2018-01-28 ENCOUNTER — Encounter: Payer: 59 | Admitting: Physician Assistant

## 2018-03-15 ENCOUNTER — Other Ambulatory Visit: Payer: Self-pay | Admitting: Physician Assistant

## 2018-03-15 DIAGNOSIS — I10 Essential (primary) hypertension: Secondary | ICD-10-CM

## 2018-03-22 ENCOUNTER — Encounter: Payer: Self-pay | Admitting: Family Medicine

## 2018-03-22 ENCOUNTER — Ambulatory Visit (INDEPENDENT_AMBULATORY_CARE_PROVIDER_SITE_OTHER): Payer: Managed Care, Other (non HMO) | Admitting: Family Medicine

## 2018-03-22 VITALS — BP 134/88 | HR 77 | Temp 98.5°F | Resp 16

## 2018-03-22 DIAGNOSIS — R42 Dizziness and giddiness: Secondary | ICD-10-CM

## 2018-03-22 MED ORDER — MECLIZINE HCL 25 MG PO TABS: 25.0000 mg | ORAL_TABLET | Freq: Three times a day (TID) | ORAL | 0 refills | Status: DC | PRN

## 2018-03-22 NOTE — Progress Notes (Signed)
Subjective:     Patient ID: Angela Hodges, female   DOB: 1956/03/10, 62 y.o.   MRN: [id number] Chief Complaint  Patient presents with  . Dizziness    Patient comes in office today with complaints of dizziness for the past 3 days, patient has been taking otc dramamine.    HPI Reports mild, non-exertional vertigo when she turns her head. States she had the flu 1.5 week ago.  Review of Systems     Objective:   Physical Exam  Constitutional: She appears well-developed and well-nourished. No distress.  HENT:  Right Ear: External ear normal.  Left Ear: External ear normal.  Eyes: Pupils are equal, round, and reactive to light. EOM are normal.  EOM's provoke vertigo-no nystagmus noted.  Cardiovascular: Normal rate and regular rhythm.  Pulmonary/Chest: Breath sounds normal.  Musculoskeletal: She exhibits no edema (of lower extremities).       Assessment:    1. Vertigo: most likely positional-will try meclizine    Plan:    Consider ENT referral if not improving. Work excuse provided to document her condition.

## 2018-03-22 NOTE — Patient Instructions (Signed)
Let me know if you are not improving over the course of the week.

## 2018-04-15 ENCOUNTER — Telehealth: Payer: Self-pay | Admitting: Family Medicine

## 2018-04-15 NOTE — Telephone Encounter (Signed)
Please advise. KW 

## 2018-04-15 NOTE — Telephone Encounter (Signed)
Patient has been advised. KW 

## 2018-04-15 NOTE — Telephone Encounter (Signed)
Pt wants to know if Nadine CountsBob could tell her a weight loss drink or shake that she could use without much sugar.  Her call back is [phone]  Thanks teri

## 2018-04-15 NOTE — Telephone Encounter (Signed)
Have her look into the Weight Loss program food products. Also the [facility]diabetes class at [facility].

## 2018-06-21 ENCOUNTER — Other Ambulatory Visit: Payer: Self-pay | Admitting: Physician Assistant

## 2018-06-21 DIAGNOSIS — A601 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: Secondary | ICD-10-CM

## 2018-06-21 MED ORDER — VALACYCLOVIR HCL 1 G PO TABS: 1000.0000 mg | ORAL_TABLET | Freq: Every day | ORAL | 3 refills | Status: DC

## 2018-06-21 NOTE — Telephone Encounter (Signed)
[facility] pharmacy faxed a refill request for the following medication. Thanks CC  valACYclovir (VALTREX) 1000 MG tablet

## 2018-07-08 ENCOUNTER — Other Ambulatory Visit: Payer: Self-pay | Admitting: Physician Assistant

## 2018-07-08 DIAGNOSIS — G4700 Insomnia, unspecified: Secondary | ICD-10-CM

## 2018-07-08 NOTE — Telephone Encounter (Signed)
[facility] faxed a refill request for the following medication. Thanks CC ° °zolpidem (AMBIEN) 10 MG tablet  ° °

## 2018-07-09 MED ORDER — ZOLPIDEM TARTRATE 10 MG PO TABS: ORAL_TABLET | ORAL | 1 refills | Status: DC

## 2018-08-02 ENCOUNTER — Telehealth: Payer: Self-pay | Admitting: Physician Assistant

## 2018-08-02 ENCOUNTER — Encounter: Payer: Managed Care, Other (non HMO) | Admitting: Physician Assistant

## 2018-08-02 DIAGNOSIS — B372 Candidiasis of skin and nail: Secondary | ICD-10-CM

## 2018-08-02 MED ORDER — NYSTATIN 100000 UNIT/GM EX CREA: 1.0000 "application " | TOPICAL_CREAM | Freq: Two times a day (BID) | CUTANEOUS | 0 refills | Status: DC

## 2018-08-02 NOTE — Telephone Encounter (Signed)
Sent in

## 2018-08-02 NOTE — Telephone Encounter (Signed)
Pt needs a prescription on nystatin cream for a rash under her breast.    She uses [facility]  CB# [phone]  Thanks Barth Kirksteri

## 2018-08-06 ENCOUNTER — Encounter: Payer: Managed Care, Other (non HMO) | Admitting: Physician Assistant

## 2018-08-13 ENCOUNTER — Encounter: Payer: Managed Care, Other (non HMO) | Admitting: Physician Assistant

## 2018-08-17 ENCOUNTER — Telehealth: Payer: Self-pay | Admitting: Family Medicine

## 2018-08-17 ENCOUNTER — Encounter: Payer: Self-pay | Admitting: Family Medicine

## 2018-08-17 ENCOUNTER — Telehealth: Payer: Self-pay | Admitting: Physician Assistant

## 2018-08-17 ENCOUNTER — Ambulatory Visit: Payer: Managed Care, Other (non HMO) | Admitting: Family Medicine

## 2018-08-17 VITALS — BP 152/90 | HR 76 | Temp 97.9°F | Resp 16 | Wt 251.0 lb

## 2018-08-17 DIAGNOSIS — I10 Essential (primary) hypertension: Secondary | ICD-10-CM | POA: Diagnosis not present

## 2018-08-17 DIAGNOSIS — R0789 Other chest pain: Secondary | ICD-10-CM | POA: Diagnosis not present

## 2018-08-17 MED ORDER — AMLODIPINE BESYLATE 5 MG PO TABS: 5.0000 mg | ORAL_TABLET | Freq: Every day | ORAL | 0 refills | Status: DC

## 2018-08-17 NOTE — Telephone Encounter (Signed)
Card[facility] called needing pt's EKG report faxed to them for her appt tomorrow  Their fax number is  [phone]  Thanks  teri

## 2018-08-17 NOTE — Telephone Encounter (Signed)
Start new bp medication and take two Tylenol extra strength. Motrin or Aleve may elevate her bp more.

## 2018-08-17 NOTE — Patient Instructions (Addendum)
Continue aspirin daily. We will call you about the cardiology referral which will be set up as soon as possible. Start Dexilant 60 mg once daily.

## 2018-08-17 NOTE — Progress Notes (Signed)
Subjective:     Patient ID: Angela Hodges, female   DOB: 04-26-1956, 62 y.o.   MRN: [id number] Chief Complaint  Patient presents with  . Chest Pain    Patient comes in office today with complaints of chest pain for the past 2 weeks. Patient states that pain has increasingly been getting worse over the past 3 days. Patient describes pain as shooting in the middle of chest and radiating to her back. Patient denies shortness of breath, numbness or tingling. Patient states though for the past 3 days she has had a persistant headache. Patient states that yesterday at her wellness exam blood pressure was 165/105 and at home 161/91.    HPI Reports retrosternal tightness (no burning) which she notices more when she is moving or lying in bed. Reports transient palpitations   Review of Systems     Objective:   Physical Exam  Constitutional: She appears well-developed and well-nourished. No distress.  Cardiovascular: Normal rate and regular rhythm.  Pulmonary/Chest: Breath sounds normal.  Musculoskeletal: She exhibits no edema (of lower extremities).       Assessment:    1. Chest tightness: continue aspirin - EKG 12-Lead - Ambulatory referral to Cardiology  2. Essential (primary) hypertension: add amlodipine 5 mg.    Plan:    Start Dexilant 60 mg daily (#15 samples)

## 2018-08-17 NOTE — Telephone Encounter (Signed)
Patient advised.KW 

## 2018-08-17 NOTE — Telephone Encounter (Signed)
Patient said she didn't get anything for a headache while she was here today and her head is still pounding.

## 2018-08-17 NOTE — Telephone Encounter (Signed)
Please advise. KW 

## 2018-08-18 NOTE — Telephone Encounter (Signed)
[facility] faxed to [facility]. KW

## 2018-08-20 ENCOUNTER — Encounter: Payer: Managed Care, Other (non HMO) | Admitting: Physician Assistant

## 2018-08-20 ENCOUNTER — Other Ambulatory Visit: Payer: Self-pay | Admitting: Physician Assistant

## 2018-08-20 DIAGNOSIS — I10 Essential (primary) hypertension: Secondary | ICD-10-CM

## 2018-08-25 ENCOUNTER — Telehealth: Payer: Self-pay

## 2018-08-25 NOTE — Telephone Encounter (Signed)
Patient had called office to let you know that [facility] had reached out to her to inform her that her blood pressure medication is out of stock and there is no word yet of when the will have it. Patient states that medication was not helping with her blood pressure anyways and states that she wants to discuss changing it. KW

## 2018-08-26 ENCOUNTER — Other Ambulatory Visit: Payer: Self-pay | Admitting: Physician Assistant

## 2018-08-26 DIAGNOSIS — I10 Essential (primary) hypertension: Secondary | ICD-10-CM

## 2018-08-26 MED ORDER — HYDROCHLOROTHIAZIDE 25 MG PO TABS: 25.0000 mg | ORAL_TABLET | Freq: Every day | ORAL | 1 refills | Status: DC

## 2018-08-26 MED ORDER — LOSARTAN POTASSIUM 100 MG PO TABS: 100.0000 mg | ORAL_TABLET | Freq: Every day | ORAL | 1 refills | Status: DC

## 2018-08-26 NOTE — Telephone Encounter (Signed)
[id number] 

## 2018-08-26 NOTE — Telephone Encounter (Signed)
She is on 3 types of blood pressure medication. Which one is she referring to? She may also discuss bp medication with the cardiologist when she sees them next week.

## 2018-08-26 NOTE — Progress Notes (Signed)
Losartan-hctz combo on back order. Sent in losartan 100mg  and hctz 25mg  separately

## 2018-08-31 ENCOUNTER — Telehealth: Payer: Self-pay | Admitting: Physician Assistant

## 2018-08-31 NOTE — Telephone Encounter (Signed)
[id number] 

## 2018-08-31 NOTE — Telephone Encounter (Signed)
Pt returned call.  She will talk to her cardio dr. this week.  teri

## 2018-09-01 NOTE — Telephone Encounter (Signed)
Per phone team patient was advise of message below and will contact her cardiologist. Angela Hodges

## 2018-09-24 ENCOUNTER — Encounter: Payer: Self-pay | Admitting: Physician Assistant

## 2018-09-24 ENCOUNTER — Ambulatory Visit (INDEPENDENT_AMBULATORY_CARE_PROVIDER_SITE_OTHER): Payer: Managed Care, Other (non HMO) | Admitting: Physician Assistant

## 2018-09-24 VITALS — BP 132/80 | HR 78 | Temp 97.9°F | Resp 16 | Ht 65.0 in | Wt 258.2 lb

## 2018-09-24 DIAGNOSIS — Z1231 Encounter for screening mammogram for malignant neoplasm of breast: Secondary | ICD-10-CM | POA: Diagnosis not present

## 2018-09-24 DIAGNOSIS — Z0000 Encounter for general adult medical examination without abnormal findings: Secondary | ICD-10-CM

## 2018-09-24 DIAGNOSIS — Z6841 Body Mass Index (BMI) 40.0 and over, adult: Secondary | ICD-10-CM | POA: Diagnosis not present

## 2018-09-24 DIAGNOSIS — Z1239 Encounter for other screening for malignant neoplasm of breast: Secondary | ICD-10-CM

## 2018-09-24 DIAGNOSIS — Z23 Encounter for immunization: Secondary | ICD-10-CM

## 2018-09-24 DIAGNOSIS — I10 Essential (primary) hypertension: Secondary | ICD-10-CM | POA: Diagnosis not present

## 2018-09-24 NOTE — Progress Notes (Signed)
Patient: Angela Hodges, Female    DOB: 05-20-1956, 62 y.o.   MRN: [id number] Visit Date: 09/24/2018  Today's Provider: Margaretann Loveless, PA-C   Chief Complaint  Patient presents with  . Annual Exam   Subjective:    Annual physical exam CHRISTE Hodges is a 62 y.o. female who presents today for health maintenance and complete physical. She feels well. She reports she is not actively exercising do to pain in huo . She reports she is sleeping fairly well.  Last Reported : Tdap-10/16/17 Pap 08/15/16 HPV negative Mammogram 11/27/17 Colonoscopy 12/28/2015  Labs reviewed from [facility]. Copies made and will be scanned into chart. -----------------------------------------------------------------   Review of Systems  Musculoskeletal: Positive for arthralgias.  All other systems reviewed and are negative.   Social History She  reports that she has never smoked. She has never used smokeless tobacco. She reports that she does not drink alcohol or use drugs. Social History   Socioeconomic History  . Marital status: Married    Spouse name: Not on file  . Number of children: Not on file  . Years of education: Not on file  . Highest education level: Not on file  Occupational History  . Not on file  Social Needs  . Financial resource strain: Not on file  . Food insecurity:    Worry: Not on file    Inability: Not on file  . Transportation needs:    Medical: Not on file    Non-medical: Not on file  Tobacco Use  . Smoking status: Never Smoker  . Smokeless tobacco: Never Used  Substance and Sexual Activity  . Alcohol use: No  . Drug use: No  . Sexual activity: Not on file  Lifestyle  . Physical activity:    Days per week: Not on file    Minutes per session: Not on file  . Stress: Not on file  Relationships  . Social connections:    Talks on phone: Not on file    Gets together: Not on file    Attends religious service: Not on file    Active member of club  or organization: Not on file    Attends meetings of clubs or organizations: Not on file    Relationship status: Not on file  Other Topics Concern  . Not on file  Social History Narrative  . Not on file    Patient Active Problem List   Diagnosis Date Noted  . DJD (degenerative joint disease) of cervical spine 05/28/2017  . Morbid obesity due to excess calories (HCC) 05/02/2016  . BMI 40.0-44.9, adult (HCC) 05/02/2016  . Migraine without aura and without status migrainosus, not intractable 02/15/2016  . Elevated hemoglobin A1c measurement 08/22/2015  . Allergic rhinitis 04/23/2015  . Cannot sleep 04/23/2015  . Borderline diabetes 04/23/2015  . 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 Acquired equinus deformity of foot 05/03/2012  . Avitaminosis D 04/03/2010  . Hypercholesterolemia without hypertriglyceridemia 04/10/2009  . 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 Essential (primary) hypertension 12/29/1998  . 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 ABDOMINAL HYSTERECTOMY  2001   has 1 ovary left  . COLONOSCOPY WITH PROPOFOL N/A 12/28/2015   Procedure: COLONOSCOPY WITH PROPOFOL;  Surgeon: Midge Minium, MD;  Location: [facility];  Service: Endoscopy;  Laterality: N/A;  . FOOT SURGERY Right 2009  . KNEE LIGAMENT RECONSTRUCTION Left    torn ACL  Family History  Family Status  Relation Name Status  . Mother  Deceased  . Father  Deceased  . Sister  Alive  . Brother  Deceased  . Son  Alive  . MGM  Deceased  . MGF  Deceased  . PGM  Deceased  . PGF  Deceased  . Brother  Alive  . Brother  Alive  . Sister  Alive  . Sister  Alive  . Sister  Alive   Her family history includes COPD in her brother; Dementia in her father, maternal grandmother, and mother; Diabetes in her brother, brother, father, mother, and sister; Early death (age of onset: 34) in her brother; Fibromyalgia in her sister, sister, and sister;  Heart disease in her maternal grandfather; Heart murmur in her sister; Hyperlipidemia in her mother; Hypertension in her mother.     Allergies  Allergen Reactions  . Penicillins Hives  . Ranitidine Hcl Hives  . Sulfa Antibiotics Hives  . Tramadol Hcl Hives    Previous Medications   AMLODIPINE (NORVASC) 5 MG TABLET    Take 1 tablet (5 mg total) by mouth daily.   ASPIRIN 81 MG TABLET    Take 1 tablet by mouth daily.   CHOLECALCIFEROL 1000 UNITS CAPSULE    Take 1 capsule by mouth 3 (three) times daily.   HYDROCHLOROTHIAZIDE (HYDRODIURIL) 25 MG TABLET    Take 1 tablet (25 mg total) by mouth daily.   LOSARTAN (COZAAR) 100 MG TABLET    Take 1 tablet (100 mg total) by mouth daily.   LOSARTAN-HYDROCHLOROTHIAZIDE (HYZAAR) 100-25 MG TABLET    TAKE 1 TABLET BY MOUTH  DAILY   OMEGA-3 FATTY ACIDS (FISH OIL) 1200 MG CAPS    Take by mouth.   VALSARTAN-HYDROCHLOROTHIAZIDE (DIOVAN-HCT) 160-25 MG TABLET       ZOLPIDEM (AMBIEN) 10 MG TABLET    1/2 to one at bedtime as needed for insomnia    Patient Care Team: Margaretann Loveless, PA-C as PCP - General (Family Medicine)      Objective:   Vitals: BP 132/80   Pulse 78   Temp 97.9 F (36.6 C) (Oral)   Resp 16   Ht 5\' 5"  (1.651 m)   Wt 258 lb 3.2 oz (117.1 kg)   SpO2 99%   BMI 42.97 kg/m    Physical Exam  Constitutional: She is oriented to person, place, and time. She appears well-developed and well-nourished. No distress.  HENT:  Head: Normocephalic and atraumatic.  Right Ear: Hearing, tympanic membrane, external ear and ear canal normal.  Left Ear: Hearing, tympanic membrane, external ear and ear canal normal.  Nose: Nose normal.  Mouth/Throat: Uvula is midline, oropharynx is clear and moist and mucous membranes are normal. No oropharyngeal exudate.  Eyes: Pupils are equal, round, and reactive to light. Conjunctivae and EOM are normal. Right eye exhibits no discharge. Left eye exhibits no discharge. No scleral icterus.  Neck: Normal range  of motion. Neck supple. No JVD present. Carotid bruit is not present. No tracheal deviation present. No thyromegaly present.  Cardiovascular: Normal rate, regular rhythm, normal heart sounds and intact distal pulses. Exam reveals no gallop and no friction rub.  No murmur heard. Pulmonary/Chest: Effort normal and breath sounds normal. No respiratory distress. She has no wheezes. She has no rales. She exhibits no tenderness.  Abdominal: Soft. Bowel sounds are normal. She exhibits no distension and no mass. There is no tenderness. There is no rebound and no guarding.  Musculoskeletal: Normal range of motion.  She exhibits no edema or tenderness.  Lymphadenopathy:    She has no cervical adenopathy.  Neurological: She is alert and oriented to person, place, and time.  Skin: Skin is warm and dry. No rash noted. She is not diaphoretic.  Psychiatric: She has a normal mood and affect. Her behavior is normal. Judgment and thought content normal.  Vitals reviewed.    Depression Screen PHQ 2/9 Scores 09/24/2018 10/16/2017  PHQ - 2 Score 0 0  PHQ- 9 Score 0 -      Assessment & Plan:     Routine Health Maintenance and Physical Exam  Exercise Activities and Dietary recommendations Goals   None     Immunization History  Administered Date(s) Administered  . Influenza,inj,Quad PF,6+ Mos 09/22/2014, 10/16/2016, 10/16/2017, 09/24/2018  . Td 12/29/1994  . Tdap 03/15/2009    Health Maintenance  Topic Date Due  . HIV Screening  06/08/1973  . INFLUENZA VACCINE  07/29/2018  . TETANUS/TDAP  03/16/2019  . PAP SMEAR  08/16/2019  . MAMMOGRAM  11/27/2019  . COLONOSCOPY  12/27/2025  . Hepatitis C Screening  Completed     Discussed health benefits of physical activity, and encouraged her to engage in regular exercise appropriate for her age and condition.    1. Annual physical exam Normal exam today. Labs reviewed from [facility].   2. Breast cancer screening There is no family history of  breast cancer. She does perform regular self breast exams. Mammogram was ordered as below. Information for [facility] clinic was given to patient so she may schedule her mammogram at her convenience. - MM 3D SCREEN BREAST BILATERAL; Future  3. Essential (primary) hypertension Stable. Continue Diovan HCT 160-25mg .   4. BMI 40.0-44.9, adult [facility]) Counseled patient on healthy lifestyle modifications including dieting and exercise.   5. Morbidly obese (HCC) See above medical treatment plan.  --------------------------------------------------------------------

## 2018-09-28 ENCOUNTER — Encounter: Payer: Self-pay | Admitting: Physician Assistant

## 2018-11-16 ENCOUNTER — Other Ambulatory Visit: Payer: Self-pay | Admitting: Physician Assistant

## 2018-11-16 DIAGNOSIS — G4700 Insomnia, unspecified: Secondary | ICD-10-CM

## 2018-11-29 ENCOUNTER — Ambulatory Visit
Admission: RE | Admit: 2018-11-29 | Discharge: 2018-11-29 | Disposition: A | Discharge status: Home / Self Care | Payer: Self-pay | Source: Ambulatory Visit | Attending: Physician Assistant | Admitting: Physician Assistant

## 2018-11-29 DIAGNOSIS — Z1239 Encounter for other screening for malignant neoplasm of breast: Secondary | ICD-10-CM | POA: Insufficient documentation

## 2018-11-30 ENCOUNTER — Telehealth: Payer: Self-pay | Admitting: Physician Assistant

## 2018-11-30 NOTE — Telephone Encounter (Signed)
Pt states pt has had a migraine since 5pm 11/29/18, pt states she took tylenol and it isn't helping.  States she would like to have something called into [facility].

## 2018-11-30 NOTE — Telephone Encounter (Signed)
Please review.  dbs 

## 2018-11-30 NOTE — Telephone Encounter (Signed)
Let message to see Urgent Care of come in 12/4 for injection

## 2018-12-01 ENCOUNTER — Telehealth: Payer: Self-pay

## 2018-12-01 NOTE — Telephone Encounter (Signed)
[facility] 

## 2018-12-01 NOTE — Telephone Encounter (Signed)
-----   Message from Jennifer M Burnette, PA-C sent at 12/01/2018  9:36 AM EST ----- Normal mammogram. Repeat screening in one year. 

## 2018-12-01 NOTE — Telephone Encounter (Signed)
Patient advised as below.  

## 2019-01-20 ENCOUNTER — Encounter: Payer: Self-pay | Admitting: Family Medicine

## 2019-01-20 ENCOUNTER — Ambulatory Visit
Admission: RE | Admit: 2019-01-20 | Discharge: 2019-01-20 | Disposition: A | Discharge status: Home / Self Care | Payer: Managed Care, Other (non HMO) | Attending: Family Medicine | Admitting: Family Medicine

## 2019-01-20 ENCOUNTER — Ambulatory Visit
Admission: RE | Admit: 2019-01-20 | Discharge: 2019-01-20 | Disposition: A | Discharge status: Home / Self Care | Payer: Managed Care, Other (non HMO) | Source: Ambulatory Visit | Attending: Family Medicine | Admitting: Family Medicine

## 2019-01-20 ENCOUNTER — Ambulatory Visit: Payer: Managed Care, Other (non HMO) | Admitting: Family Medicine

## 2019-01-20 VITALS — BP 140/90 | HR 91 | Temp 98.2°F | Resp 16

## 2019-01-20 DIAGNOSIS — J111 Influenza due to unidentified influenza virus with other respiratory manifestations: Secondary | ICD-10-CM | POA: Diagnosis not present

## 2019-01-20 DIAGNOSIS — R918 Other nonspecific abnormal finding of lung field: Secondary | ICD-10-CM

## 2019-01-20 DIAGNOSIS — R509 Fever, unspecified: Secondary | ICD-10-CM | POA: Insufficient documentation

## 2019-01-20 NOTE — Patient Instructions (Signed)
Add Mucinex DM for cough. We will call about the x-ray report.

## 2019-01-20 NOTE — Progress Notes (Signed)
Subjective:     Patient ID: Angela Hodges, female   DOB: [birth year], 63 y.o.   MRN: [id number] Chief Complaint  Patient presents with  . Influenza    Patient comes in office today requesting a doctors note to return to work. Patient states that 4 days ago she began experiencing muscle aches, headache, cough and sneezing. Patient states by the 2nd day she went and signed in to [facility] and based off her symptoms she was diagnosed with Influenza and prescribed Tamiflu. Patient states that employer states she needs a note from her PCP before returning back to her normal work duties.    HPI States she is starting to feel better on day # 3 of Tamiflu Reports sinus congestion is clearing in color. Continues to have sweats.  Review of Systems     Objective:   Physical Exam Constitutional:      General: She is not in acute distress.    Appearance: She is ill-appearing.  Neurological:     Mental Status: She is alert.   Ears: T.M's intact without inflammation Throat: no tonsillar enlargement or exudate Neck: no cervical adenopathy Lungs: right basilar crackles that do not clear with cough     Assessment:    1. Influenza  2. Lung field abnormal finding on examination - DG Chest 2 View; Future    Plan:    Add Mucinex DM. Further f/u pending x-ray report. Work excuse for 1/21-1/24 provided.

## 2019-01-21 ENCOUNTER — Ambulatory Visit: Payer: Managed Care, Other (non HMO) | Admitting: Physician Assistant

## 2019-01-25 ENCOUNTER — Other Ambulatory Visit: Payer: Self-pay | Admitting: Family Medicine

## 2019-02-22 ENCOUNTER — Encounter: Payer: Self-pay | Admitting: Family Medicine

## 2019-02-22 ENCOUNTER — Ambulatory Visit: Payer: Managed Care, Other (non HMO) | Admitting: Family Medicine

## 2019-02-22 VITALS — BP 110/82 | HR 91 | Temp 98.5°F | Resp 16

## 2019-02-22 DIAGNOSIS — S39012A Strain of muscle, fascia and tendon of lower back, initial encounter: Secondary | ICD-10-CM

## 2019-02-22 MED ORDER — MELOXICAM 15 MG PO TABS: 15.0000 mg | ORAL_TABLET | Freq: Every day | ORAL | 0 refills | Status: DC

## 2019-02-22 NOTE — Patient Instructions (Signed)
Try a lumbar pillow in your office chair. If not improving in the next two weeks, consider x-ray of your lower back. May also use Tylenol up to 3000 mg/day.

## 2019-02-22 NOTE — Progress Notes (Signed)
Subjective:     Patient ID: Angela Hodges, female   DOB: [birth year], 63 y.o.   MRN: [id number] Chief Complaint  Patient presents with  . Back Pain    Patient comes in office today with complaints of lower back pain for two weeks. Patient states that she has been taking Ibuprofen and prescription muscle relaxants.     HPI Denies specific injury or radicular pain but states she sits more at work and her chair at work is "old".She has seen orthopedics in the past for right hip bursitis. Has been taking 4 ibuprofen/day and an occasional cyclobenzaprine for her sx.  Review of Systems     Objective:   Physical Exam Constitutional:      General: She is in acute distress (mild to moderate pain when changing positions).  Musculoskeletal:     Comments: Localizes across her lower lumbar back. Muscle strength in lower extremities 5/5. SLR's to 60 degrees before increased back pain without radiation.  Neurological:     Mental Status: She is alert.        Assessment:    1. Strain of lumbar region, initial encounter - meloxicam (MOBIC) 15 MG tablet; Take 1 tablet (15 mg total) by mouth daily.  Dispense: 30 tablet; Refill: 0    Plan:    Discussed use of lumbar pillow at work and Tylenol use. Will call if not improving over the next two weeks.

## 2019-03-04 ENCOUNTER — Telehealth: Payer: Self-pay

## 2019-03-04 NOTE — Telephone Encounter (Signed)
Acknowledged. Recommend extra fluids and bland diet. May be having hemorrhoid irritation. Will check tomorrow.

## 2019-03-04 NOTE — Telephone Encounter (Signed)
Patient advised as below.  

## 2019-03-04 NOTE — Telephone Encounter (Signed)
Patient reports blood in stools for 1 week. Patient reports she is constipated and has been taking stool softeners. Patient reports blood on tissue and on stools. Patient reports she started having abdominal pain today and was able to have a bowel moment today after stools softeners. appt tomorrow at 9:20 am

## 2019-03-05 ENCOUNTER — Ambulatory Visit (INDEPENDENT_AMBULATORY_CARE_PROVIDER_SITE_OTHER): Payer: Managed Care, Other (non HMO) | Admitting: Family Medicine

## 2019-03-05 ENCOUNTER — Encounter: Payer: Self-pay | Admitting: Family Medicine

## 2019-03-05 VITALS — BP 126/76 | HR 91 | Temp 97.7°F

## 2019-03-05 DIAGNOSIS — K5904 Chronic idiopathic constipation: Secondary | ICD-10-CM

## 2019-03-05 DIAGNOSIS — K648 Other hemorrhoids: Secondary | ICD-10-CM | POA: Diagnosis not present

## 2019-03-05 MED ORDER — HYDROCORTISONE ACETATE 25 MG RE SUPP: 25.0000 mg | Freq: Two times a day (BID) | RECTAL | 0 refills | Status: DC

## 2019-03-05 NOTE — Patient Instructions (Signed)
Chronic Constipation  Chronic constipation is a condition in which a person has three or fewer bowel movements a week, for three months or longer. This condition is especially common in older adults. The two main kinds of chronic constipation are secondary constipation and functional constipation. Secondary constipation results from another condition or a treatment. Functional constipation, also called primary or idiopathic constipation, is divided into three types:  Normal transit constipation. In this type, movement of stool through the colon (stool transit) occurs normally.  Slow transit constipation. In this type, stool moves slowly through the colon.  Outlet constipation or pelvic floor dysfunction. In this type, the nerves and muscles that empty the rectum do not work normally. What are the causes? Causes of secondary constipation may include:  Failing to drink enough fluid, eat enough food or fiber, or get physically active.  Pregnancy.  A tear in the anus (anal fissure).  Blockage in the bowel (bowel obstruction).  Narrowing of the bowel (bowel stricture).  Having a long-term medical condition, such as: ? Diabetes. ? Hypothyroidism. ? Multiple sclerosis. ? Parkinson disease. ? Stroke. ? Spinal cord injury. ? Dementia. ? Colon cancer. ? Inflammatory bowel disease (IBD). ? Iron-deficiency anemia. ? Outward collapse of the rectum (rectal prolapse). ? Hemorrhoids.  Taking certain medicines, including: ? Narcotics. These are a certain type of prescription pain medicine. ? Antacids. ? Iron supplements. ? Water pills (diuretics). ? Certain blood pressure medicines. ? Anti-seizure medicines. ? Antidepressants. ? Medicines for Parkinson disease. The cause of functional constipation is not known, but some conditions are associated with it. These conditions include:  Stress.  Problems in the nerves and muscles that control stool transit.  Weak or impaired pelvic floor  muscles. What increases the risk? You may be at higher risk for chronic constipation if you:  Are older than age 70.  Are female.  Live in a long-term care facility.  Do not get much exercise or physical activity (have a sedentary lifestyle).  Do not drink enough fluids.  Do not eat enough food, especially fiber.  Have a long-term disease.  Have a mental health disorder or eating disorder.  Take many medicines. What are the signs or symptoms? The main symptom of chronic constipation is having three or fewer bowel movements a week for several weeks. Other signs and symptoms may vary from person to person. These include:  Pushing hard (straining) to pass stool.  Painful bowel movements.  Having hard or lumpy stools.  Having lower belly discomfort, such as cramps or bloating.  Being unable to have a bowel movement when you feel the urge.  Feeling like you still need to pass stool after a bowel movement.  Feeling that you have something in your rectum that is blocking or preventing bowel movements.  Seeing blood on the toilet paper or in your stool.  Worsening confusion (in older adults). How is this diagnosed? This condition may be diagnosed based on:  Symptoms and medical history. You will be asked about your symptoms, lifestyle, diet, and any medicines that you are taking.  Physical exam. ? Your belly (abdomen) will be examined. ? A digital rectal exam may be done. For this exam, a health care provider places a lubricated, gloved finger into the rectum.  Other tests to check for any underlying causes of your constipation. These may be ordered if you have bleeding in your rectum, weight loss, or a family history of colon cancer. In these cases, you may have: ? Imaging studies of   the colon. These may include X-ray, ultrasound, or CT scan. ? Blood tests. ? A procedure to examine the inside of your colon (colonoscopy). ? More specialized tests to check:  Whether  your anal sphincter works well. This is a ring-shaped muscle that controls the closing of the anus.  How well food moves through your colon. ? Tests to measure the nerve signal in your pelvic floor muscles (electromyography). How is this treated? Treatment for chronic constipation depends on the cause. Most often, treatment starts with:  Being more active and getting regular exercise.  Drinking more fluids.  Adding fiber to your diet. Sources of fiber include fruits, vegetables, whole grains, and fiber supplements.  Using medicines such as stool softeners or medicines that increase contractions in your digestive system (pro-motility agents).  Training your pelvic muscles with biofeedback.  Surgery, if there is obstruction. Treatment for secondary chronic constipation depends on the underlying condition. You may need to:  Stop or change some medicines if they cause constipation.  Use a fiber supplement (bulk laxative) or stool softener.  Use prescription laxative. This works by absorbing water into your colon (osmotic laxative). You may also need to see a specialist who treats conditions of the digestive system (gastroenterologist). Follow these instructions at home:   Take over-the-counter and prescription medicines only as told by your health care provider.  If you are taking a laxative, take it as told by your health care provider.  Eat a balanced diet that includes enough fiber. Ask your health care provider to recommend a diet that is right for you.  Drink clear fluids, especially water. Avoid drinking alcohol, caffeine, and soda.  Drink enough fluid to keep your urine pale yellow.  Get some physical activity every day. Ask your health care provider what physical activities are safe for you.  Get colon cancer screenings as told by your health care provider.  Keep all follow-up visits as told by your health care provider. This is important. Contact a health care  provider if:  You are having three or fewer bowel movements a week.  Your stools are hard or lumpy.  You notice blood on the toilet paper or in your stool after you have a bowel movement.  You have unexplained weight loss.  You have rectum (rectal) pain.  You have stool leakage.  You experience nausea or vomiting. Get help right away if:  You have rectal bleeding or you pass blood clots.  You have severe rectal pain.  You have body tissue that pushes out (protrudes) from your anus.  You have severe pain or bloating (distension) in your abdomen.  You have vomiting that you cannot control. Summary  Chronic constipation is a condition in which a person has three or fewer bowel movements a week, for three months or longer.  You may have a higher risk for this condition if you are an older adult, or if you do not drink enough water or get enough physical activity (are sedentary).  Treatment for this condition depends on the cause. Most treatments for chronic constipation include adding fiber to your diet, drinking more fluids, and getting more physical activity. You may also need to treat any underlying medical conditions or stop or change certain medicines if they cause constipation.  If lifestyle changes do not relieve constipation, your health care provider may recommend taking a laxative. This information is not intended to replace advice given to you by your health care provider. Make sure you discuss any questions you   have with your health care provider. Document Released: 07/14/2017 Document Revised: 09/01/2017 Document Reviewed: 09/01/2017 Elsevier Interactive Patient Education  2019 [employer] Hemorrhoids Hemorrhoids are swollen veins in and around the rectum or anus. There are two types of hemorrhoids:  Internal hemorrhoids. These occur in the veins that are just inside the rectum. They may poke through to the outside and become irritated and painful.  External  hemorrhoids. These occur in the veins that are outside the anus and can be felt as a painful swelling or hard lump near the anus. Most hemorrhoids do not cause serious problems, and they can be managed with home treatments such as diet and lifestyle changes. If home treatments do not help the symptoms, procedures can be done to shrink or remove the hemorrhoids. What are the causes? This condition is caused by increased pressure in the anal area. This pressure may result from various things, including:  Constipation.  Straining to have a bowel movement.  Diarrhea.  Pregnancy.  Obesity.  Sitting for long periods of time.  Heavy lifting or other activity that causes you to strain.  Anal sex.  Riding a bike for a long period of time. What are the signs or symptoms? Symptoms of this condition include:  Pain.  Anal itching or irritation.  Rectal bleeding.  Leakage of stool (feces).  Anal swelling.  One or more lumps around the anus. How is this diagnosed? This condition can often be diagnosed through a visual exam. Other exams or tests may also be done, such as:  An exam that involves feeling the rectal area with a gloved hand (digital rectal exam).  An exam of the anal canal that is done using a small tube (anoscope).  A blood test, if you have lost a significant amount of blood.  A test to look inside the colon using a flexible tube with a camera on the end (sigmoidoscopy or colonoscopy). How is this treated? This condition can usually be treated at home. However, various procedures may be done if dietary changes, lifestyle changes, and other home treatments do not help your symptoms. These procedures can help make the hemorrhoids smaller or remove them completely. Some of these procedures involve surgery, and others do not. Common procedures include:  Rubber band ligation. Rubber bands are placed at the base of the hemorrhoids to cut off their blood  supply.  Sclerotherapy. Medicine is injected into the hemorrhoids to shrink them.  Infrared coagulation. A type of light energy is used to get rid of the hemorrhoids.  Hemorrhoidectomy surgery. The hemorrhoids are surgically removed, and the veins that supply them are tied off.  Stapled hemorrhoidopexy surgery. The surgeon staples the base of the hemorrhoid to the rectal wall. Follow these instructions at home: Eating and drinking   Eat foods that have a lot of fiber in them, such as whole grains, beans, nuts, fruits, and vegetables.  Ask your health care provider about taking products that have added fiber (fiber supplements).  Reduce the amount of fat in your diet. You can do this by eating low-fat dairy products, eating less red meat, and avoiding processed foods.  Drink enough fluid to keep your urine pale yellow. Managing pain and swelling   Take warm sitz baths for 20 minutes, 3-4 times a day to ease pain and discomfort. You may do this in a bathtub or using a portable sitz bath that fits over the toilet.  If directed, apply ice to the affected area. Using ice packs between  sitz baths may be helpful. ? Put ice in a plastic bag. ? Place a towel between your skin and the bag. ? Leave the ice on for 20 minutes, 2-3 times a day. General instructions  Take over-the-counter and prescription medicines only as told by your health care provider.  Use medicated creams or suppositories as told.  Get regular exercise. Ask your health care provider how much and what kind of exercise is best for you. In general, you should do moderate exercise for at least 30 minutes on most days of the week (150 minutes each week). This can include activities such as walking, biking, or yoga.  Go to the bathroom when you have the urge to have a bowel movement. Do not wait.  Avoid straining to have bowel movements.  Keep the anal area dry and clean. Use wet toilet paper or moist towelettes after a  bowel movement.  Do not sit on the toilet for long periods of time. This increases blood pooling and pain.  Keep all follow-up visits as told by your health care provider. This is important. Contact a health care provider if you have:  Increasing pain and swelling that are not controlled by treatment or medicine.  Difficulty having a bowel movement, or you are unable to have a bowel movement.  Pain or inflammation outside the area of the hemorrhoids. Get help right away if you have:  Uncontrolled bleeding from your rectum. Summary  Hemorrhoids are swollen veins in and around the rectum or anus.  Most hemorrhoids can be managed with home treatments such as diet and lifestyle changes.  Taking warm sitz baths can help ease pain and discomfort.  In severe cases, procedures or surgery can be done to shrink or remove the hemorrhoids. This information is not intended to replace advice given to you by your health care provider. Make sure you discuss any questions you have with your health care provider. Document Released: 12/12/2000 Document Revised: 05/06/2018 Document Reviewed: 05/06/2018 Elsevier Interactive Patient Education  2019 [employer].
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
## 2019-04-14 ENCOUNTER — Telehealth: Payer: Self-pay | Admitting: Physician Assistant

## 2019-04-14 ENCOUNTER — Encounter: Payer: Self-pay | Admitting: Physician Assistant

## 2019-04-14 ENCOUNTER — Ambulatory Visit (INDEPENDENT_AMBULATORY_CARE_PROVIDER_SITE_OTHER): Payer: Managed Care, Other (non HMO) | Admitting: Physician Assistant

## 2019-04-14 DIAGNOSIS — G43019 Migraine without aura, intractable, without status migrainosus: Secondary | ICD-10-CM

## 2019-04-14 MED ORDER — SUMATRIPTAN SUCCINATE 100 MG PO TABS: 100.0000 mg | ORAL_TABLET | ORAL | 5 refills | Status: DC | PRN

## 2019-04-14 NOTE — Telephone Encounter (Signed)
Patient prefers not to come into the office for an injection. She prefers that a medication be prescribed for her migraines. She is currently at work. Do you want to possibly schedule an e-visit?
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
## 2019-04-14 NOTE — Telephone Encounter (Signed)
In her chart I can see where Angela Hodges has given her a Toradol IM for her migraines in the past. I could not find any other medications documented. She can come in for a toradol IM today if she desires.

## 2019-04-14 NOTE — Telephone Encounter (Signed)
Pt called saying she has been having migraine headaches.  She has been taking the ibuprofen but that is not helping a lot.  She wants to know if we can send something to the pharmacy.   She said she has taken something in the past  [facility]  CB#  [phone]  Thanks Barth Kirks

## 2019-04-14 NOTE — Patient Instructions (Signed)
Sumatriptan tablets  What is this medicine?  SUMATRIPTAN (soo ma TRIP tan) is used to treat migraines with or without aura. An aura is a strange feeling or visual disturbance that warns you of an attack. It is not used to prevent migraines.  This medicine may be used for other purposes; ask your health care provider or pharmacist if you have questions.  COMMON BRAND NAME(S): Imitrex, Migraine Pack  What should I tell my health care provider before I take this medicine?  They need to know if you have any of these conditions:  -cigarette smoker  -circulation problems in fingers and toes  -diabetes  -heart disease  -high blood pressure  -high cholesterol  -history of irregular heartbeat  -history of stroke  -kidney disease  -liver disease  -stomach or intestine problems  -an unusual or allergic reaction to sumatriptan, other medicines, foods, dyes, or preservatives  -pregnant or trying to get pregnant  -breast-feeding  How should I use this medicine?  Take this medicine by mouth with a glass of water. Follow the directions on the prescription label. Do not take it more often than directed.  Talk to your pediatrician regarding the use of this medicine in children. Special care may be needed.  Overdosage: If you think you have taken too much of this medicine contact a poison control center or emergency room at once.  NOTE: This medicine is only for you. Do not share this medicine with others.  What if I miss a dose?  This does not apply. This medicine is not for regular use.  What may interact with this medicine?  Do not take this medicine with any of the following medicines:  -certain medicines for migraine headache like almotriptan, eletriptan, frovatriptan, naratriptan, rizatriptan, sumatriptan, zolmitriptan  -ergot alkaloids like dihydroergotamine, ergonovine, ergotamine, methylergonovine  -MAOIs like Carbex, Eldepryl, Marplan, Nardil, and Parnate  This medicine may also interact with the following  medications:  -certain medicines for depression, anxiety, or psychotic disorders  This list may not describe all possible interactions. Give your health care provider a list of all the medicines, herbs, non-prescription drugs, or dietary supplements you use. Also tell them if you smoke, drink alcohol, or use illegal drugs. Some items may interact with your medicine.  What should I watch for while using this medicine?  Visit your healthcare professional for regular checks on your progress. Tell your healthcare professional if your symptoms do not start to get better or if they get worse.  You may get drowsy or dizzy. Do not drive, use machinery, or do anything that needs mental alertness until you know how this medicine affects you. Do not stand up or sit up quickly, especially if you are an older patient. This reduces the risk of dizzy or fainting spells. Alcohol may interfere with the effect of this medicine.  Tell your healthcare professional right away if you have any change in your eyesight.  If you take migraine medicines for 10 or more days a month, your migraines may get worse. Keep a diary of headache days and medicine use. Contact your healthcare professional if your migraine attacks occur more frequently.  What side effects may I notice from receiving this medicine?  Side effects that you should report to your doctor or health care professional as soon as possible:  -allergic reactions like skin rash, itching or hives, swelling of the face, lips, or tongue  -changes in vision  -chest pain or chest tightness  -signs and symptoms of a   dangerous change in heartbeat or heart rhythm like chest pain; dizziness; fast, irregular heartbeat; palpitations; feeling faint or lightheaded; falls; breathing problems  -signs and symptoms of a stroke like changes in vision; confusion; trouble speaking or understanding; severe headaches; sudden numbness or weakness of the face, arm or leg; trouble walking; dizziness; loss of  balance or coordination  -signs and symptoms of serotonin syndrome like irritable; confusion; diarrhea; fast or irregular heartbeat; muscle twitching; stiff muscles; trouble walking; sweating; high fever; seizures; chills; vomiting  Side effects that usually do not require medical attention (report to your doctor or health care professional if they continue or are bothersome):  -diarrhea  -dizziness  -drowsiness  -dry mouth  -headache  -nausea, vomiting  -pain, tingling, numbness in the hands or feet  -stomach pain  This list may not describe all possible side effects. Call your doctor for medical advice about side effects. You may report side effects to FDA at 1-800-FDA-1088.  Where should I keep my medicine?  Keep out of the reach of children.  Store at room temperature between 2 and 30 degrees C (36 and 86 degrees F). Throw away any unused medicine after the expiration date.  NOTE: This sheet is a summary. It may not cover all possible information. If you have questions about this medicine, talk to your doctor, pharmacist, or health care provider.  © 2019 Elsevier/Gold Standard (2018-06-29 15:05:37)    Migraine Headache    A migraine headache is a very strong throbbing pain on one side or both sides of your head. Migraines can also cause other symptoms. Talk with your doctor about what things may bring on (trigger) your migraine headaches.  Follow these instructions at home:  Medicines  · Take over-the-counter and prescription medicines only as told by your doctor.  · Do not drive or use heavy machinery while taking prescription pain medicine.  · To prevent or treat constipation while you are taking prescription pain medicine, your doctor may recommend that you:  ? Drink enough fluid to keep your pee (urine) clear or pale yellow.  ? Take over-the-counter or prescription medicines.  ? Eat foods that are high in fiber. These include fresh fruits and vegetables, whole grains, and beans.  ? Limit foods that are high  in fat and processed sugars. These include fried and sweet foods.  Lifestyle  · Avoid alcohol.  · Do not use any products that contain nicotine or tobacco, such as cigarettes and e-cigarettes. If you need help quitting, ask your doctor.  · Get at least 8 hours of sleep every night.  · Limit your stress.  General instructions    · Keep a journal to find out what may bring on your migraines. For example, write down:  ? What you eat and drink.  ? How much sleep you get.  ? Any change in what you eat or drink.  ? Any change in your medicines.  · If you have a migraine:  ? Avoid things that make your symptoms worse, such as bright lights.  ? It may help to lie down in a dark, quiet room.  ? Do not drive or use heavy machinery.  ? Ask your doctor what activities are safe for you.  · Keep all follow-up visits as told by your doctor. This is important.  Contact a doctor if:  · You get a migraine that is different or worse than your usual migraines.  Get help right away if:  · Your migraine gets   very bad.  · You have a fever.  · You have a stiff neck.  · You have trouble seeing.  · Your muscles feel weak or like you cannot control them.  · You start to lose your balance a lot.  · You start to have trouble walking.  · You pass out (faint).  This information is not intended to replace advice given to you by your health care provider. Make sure you discuss any questions you have with your health care provider.  Document Released: 09/23/2008 Document Revised: 09/08/2018 Document Reviewed: 06/02/2016  Elsevier Interactive Patient Education © 2019 Elsevier Inc.

## 2019-04-14 NOTE — Telephone Encounter (Signed)
Please advise 

## 2019-05-02 ENCOUNTER — Ambulatory Visit (INDEPENDENT_AMBULATORY_CARE_PROVIDER_SITE_OTHER): Payer: Managed Care, Other (non HMO) | Admitting: Physician Assistant

## 2019-05-02 ENCOUNTER — Encounter: Payer: Self-pay | Admitting: Physician Assistant

## 2019-05-02 VITALS — BP 160/90

## 2019-05-02 DIAGNOSIS — G43011 Migraine without aura, intractable, with status migrainosus: Secondary | ICD-10-CM

## 2019-05-02 DIAGNOSIS — I10 Essential (primary) hypertension: Secondary | ICD-10-CM

## 2019-05-02 MED ORDER — PREDNISONE 20 MG PO TABS: 20.0000 mg | ORAL_TABLET | Freq: Two times a day (BID) | ORAL | 0 refills | Status: DC

## 2019-05-02 NOTE — Progress Notes (Signed)
Virtual Visit via Video Note  I connected with Angela Hodges on 05/04/19 at  2:20 PM EDT by a video enabled telemedicine application and verified that I am speaking with the correct person using two identifiers.  Location: Patient: home Provider: [facility]   I discussed the limitations of evaluation and management by telemedicine and the availability of in person appointments. The patient expressed understanding and agreed to proceed.   Angela Loveless, PA-C   Patient: Angela Hodges Female    DOB: 1956-09-13   63 y.o.   MRN: [id number] Visit Date: 05/02/2019  Today's Provider: Margaretann Loveless, PA-C   Chief Complaint  Patient presents with  . Headache   Subjective:     HPI  Angela Hodges is a 63 yr old female that presents to the office via video visit for chronic headache. She has had a headache for about 5 days currently. She was seen in the office on 04/14/19 for similar symptoms and was given a toradol IM injection. She reports this did break the headache at that time, but returned on Friday last week.  She also reports that she did try the Imitrex on Saturday and had worsening headache, BP elevated to 178/110 and she felt "weird". These symptoms lasted for about 4 hours and eventually subsided. She did not take this any other time. BP today was elevated at 160/90. Patient thinks this is from her headache and being in pain. Reports her normal BP runs around 140/80 without headache and she is compliant on her BP medications.   Allergies  Allergen Reactions  . Penicillins Hives  . Ranitidine Hcl Hives  . Sulfa Antibiotics Hives  . Tramadol Hcl Hives     Current Outpatient Medications:  .  aspirin 81 MG tablet, Take 1 tablet by mouth daily., Disp: , Rfl:  .  Cholecalciferol 1000 UNITS capsule, Take 1 capsule by mouth 3 (three) times daily., Disp: , Rfl:  .  meloxicam (MOBIC) 15 MG tablet, Take 1 tablet (15 mg total) by mouth daily.,  Disp: 30 tablet, Rfl: 0 .  Omega-3 Fatty Acids (FISH OIL) 1200 MG CAPS, Take by mouth., Disp: , Rfl:  .  valACYclovir (VALTREX) 1000 MG tablet, , Disp: , Rfl:  .  valsartan-hydrochlorothiazide (DIOVAN-HCT) 160-25 MG tablet, , Disp: , Rfl:  .  zolpidem (AMBIEN) 10 MG tablet, TAKE 1/2 TO 1 TABLET BY  MOUTH AT BEDTIME AS NEEDED  FOR INSOMNIA, Disp: 90 tablet, Rfl: 1 .  amLODipine (NORVASC) 5 MG tablet, Take 1 tablet (5 mg total) by mouth daily. (Patient not taking: Reported on 05/02/2019), Disp: 30 tablet, Rfl: 0 .  hydrocortisone (ANUSOL-HC) 25 MG suppository, Place 1 suppository (25 mg total) rectally 2 (two) times daily. (Patient not taking: Reported on 05/02/2019), Disp: 12 suppository, Rfl: 0 .  oseltamivir (TAMIFLU) 75 MG capsule, TAKE ONE CAPSULE BY MOUTH TWICE DAILY FOR FIVE DAYS, Disp: , Rfl:  .  SUMAtriptan (IMITREX) 100 MG tablet, Take 1 tablet (100 mg total) by mouth every 2 (two) hours as needed for migraine. Do NOT take more than 200mg  in 24 hrs (Patient not taking: Reported on 05/02/2019), Disp: 10 tablet, Rfl: 5  Review of Systems  Constitutional: Negative for appetite change, chills and fatigue.  Eyes: Negative for visual disturbance.  Respiratory: Negative for chest tightness and shortness of breath.   Cardiovascular: Negative for chest pain and palpitations.  Neurological: Positive for headaches. Negative for dizziness, weakness, light-headedness and numbness.  Psychiatric/Behavioral:  Negative.     Social History   Tobacco Use  . Smoking status: Never Smoker  . Smokeless tobacco: Never Used  Substance Use Topics  . Alcohol use: No      Objective:   There were no vitals taken for this visit. There were no vitals filed for this visit.   Physical Exam Vitals signs reviewed.  Constitutional:      General: She is not in acute distress.    Appearance: She is well-developed. She is obese. She is not ill-appearing.  HENT:     Head: Normocephalic and atraumatic.  Neck:      Musculoskeletal: Normal range of motion and neck supple.  Pulmonary:     Effort: Pulmonary effort is normal. No respiratory distress.  Neurological:     Mental Status: She is alert.  Psychiatric:        Behavior: Behavior normal.        Thought Content: Thought content normal.        Judgment: Judgment normal.        Assessment & Plan    1. Essential (primary) hypertension BP elevated today but patient reports that she feels it is secondary to being in pain. Reports her normal readings without headache her BP is 140/80. Continue current medications and will monitor.   2. Intractable migraine without aura and with status migrainosus Worsening with headache for 5 days straight and adverse reaction to imitrex (added to allergy list). Will use prednisone as below to break migrainous cycle. Once relieved we will see how quickly a headache returns and may try another agent for breakthrough migraines vs something for prevention. Also may benefit from neuro referral if headaches persist and are severe. She agrees.   - predniSONE (DELTASONE) 20 MG tablet; Take 1 tablet (20 mg total) by mouth 2 (two) times daily with a meal.  Dispense: 10 tablet; Refill: 0    I discussed the assessment and treatment plan with the patient. The patient was provided an opportunity to ask questions and all were answered. The patient agreed with the plan and demonstrated an understanding of the instructions.   The patient was advised to call back or seek an in-person evaluation if the symptoms worsen or if the condition fails to improve as anticipated.  I provided 15 minutes of non-face-to-face time during this encounter.  Angela Loveless, PA-C  [facility]

## 2019-05-03 ENCOUNTER — Encounter: Payer: Self-pay | Admitting: Physician Assistant

## 2019-05-04 ENCOUNTER — Encounter: Payer: Self-pay | Admitting: Physician Assistant

## 2019-05-13 ENCOUNTER — Encounter: Payer: Self-pay | Admitting: Physician Assistant

## 2019-05-16 ENCOUNTER — Other Ambulatory Visit: Payer: Self-pay | Admitting: Physician Assistant

## 2019-05-16 DIAGNOSIS — G4700 Insomnia, unspecified: Secondary | ICD-10-CM

## 2019-05-16 MED ORDER — ZOLPIDEM TARTRATE 10 MG PO TABS: 10.0000 mg | ORAL_TABLET | Freq: Every evening | ORAL | 0 refills | Status: DC | PRN

## 2019-05-16 NOTE — Telephone Encounter (Signed)
Please review

## 2019-05-16 NOTE — Addendum Note (Signed)
Addended by: Erasmo Downer on: 05/16/2019 11:43 AM   Modules accepted: Orders

## 2019-05-27 ENCOUNTER — Ambulatory Visit (INDEPENDENT_AMBULATORY_CARE_PROVIDER_SITE_OTHER): Payer: Managed Care, Other (non HMO) | Admitting: Family Medicine

## 2019-05-27 ENCOUNTER — Encounter: Payer: Self-pay | Admitting: Family Medicine

## 2019-05-27 ENCOUNTER — Other Ambulatory Visit: Payer: Self-pay

## 2019-05-27 ENCOUNTER — Ambulatory Visit
Admission: RE | Admit: 2019-05-27 | Discharge: 2019-05-27 | Disposition: A | Discharge status: Home / Self Care | Payer: Managed Care, Other (non HMO) | Source: Ambulatory Visit | Attending: Family Medicine | Admitting: Family Medicine

## 2019-05-27 VITALS — BP 126/84 | HR 76 | Temp 98.3°F | Resp 16 | Wt 255.0 lb

## 2019-05-27 DIAGNOSIS — R109 Unspecified abdominal pain: Secondary | ICD-10-CM

## 2019-05-27 DIAGNOSIS — K5909 Other constipation: Secondary | ICD-10-CM

## 2019-05-27 LAB — POCT URINALYSIS DIPSTICK
Bilirubin, UA: NEGATIVE
Blood, UA: NEGATIVE
Glucose, UA: NEGATIVE
Ketones, UA: NEGATIVE
Leukocytes, UA: NEGATIVE
Nitrite, UA: NEGATIVE
Protein, UA: NEGATIVE
Spec Grav, UA: 1.01 (ref 1.010–1.025)
Urobilinogen, UA: 0.2 E.U./dL
pH, UA: 6.5 (ref 5.0–8.0)
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
## 2019-05-28 LAB — COMPREHENSIVE METABOLIC PANEL
ALT: 33 IU/L — ABNORMAL HIGH (ref 0–32)
AST: 21 IU/L (ref 0–40)
Albumin/Globulin Ratio: 2.2 (ref 1.2–2.2)
Albumin: 4.3 g/dL (ref 3.8–4.9)
Alkaline Phosphatase: 47 IU/L (ref 39–117)
BUN/Creatinine Ratio: 21 (ref 12–28)
BUN: 17 mg/dL (ref 8–27)
Bilirubin Total: 0.3 mg/dL (ref 0.0–1.2)
CO2: 23 mmol/L (ref 20–29)
Calcium: 10.1 mg/dL (ref 8.7–10.3)
Chloride: 102 mmol/L (ref 96–106)
Creatinine, Ser: 0.82 mg/dL (ref 0.57–1.00)
GFR calc Af Amer: 90 mL/min/{1.73_m2} (ref >59)
GFR calc non Af Amer: 78 mL/min/{1.73_m2} (ref >59)
Globulin, Total: 2 g/dL (ref 1.5–4.5)
Glucose: 108 mg/dL — ABNORMAL HIGH (ref 65–99)
Potassium: 4.1 mmol/L (ref 3.5–5.2)
Sodium: 142 mmol/L (ref 134–144)
Total Protein: 6.3 g/dL (ref 6.0–8.5)

## 2019-05-28 LAB — CBC WITH DIFFERENTIAL/PLATELET
Basophils Absolute: 0.1 10*3/uL (ref 0.0–0.2)
Basos: 1 %
EOS (ABSOLUTE): 0.3 10*3/uL (ref 0.0–0.4)
Eos: 5 %
Hematocrit: 39.7 % (ref 34.0–46.6)
Hemoglobin: 13.4 g/dL (ref 11.1–15.9)
Immature Grans (Abs): 0 10*3/uL (ref 0.0–0.1)
Immature Granulocytes: 0 %
Lymphocytes Absolute: 2.1 10*3/uL (ref 0.7–3.1)
Lymphs: 31 %
MCH: 30.6 pg (ref 26.6–33.0)
MCHC: 33.8 g/dL (ref 31.5–35.7)
MCV: 91 fL (ref 79–97)
Monocytes Absolute: 0.4 10*3/uL (ref 0.1–0.9)
Monocytes: 6 %
Neutrophils Absolute: 3.8 10*3/uL (ref 1.4–7.0)
Neutrophils: 57 %
Platelets: 263 10*3/uL (ref 150–450)
RBC: 4.38 x10E6/uL (ref 3.77–5.28)
RDW: 13.2 % (ref 11.7–15.4)
WBC: 6.7 10*3/uL (ref 3.4–10.8)

## 2019-05-30 ENCOUNTER — Telehealth: Payer: Self-pay | Admitting: Physician Assistant

## 2019-05-30 NOTE — Telephone Encounter (Signed)
Pt called saying she was prescribed Linzess but she thinks this is going to be too expensive.   She said Lotromex or Alosetron 5 mg was something the pharmacy said she could use.  CB#  [phone]  Thanks Barth Kirks

## 2019-05-31 NOTE — Telephone Encounter (Signed)
Please advise 

## 2019-06-15 NOTE — Progress Notes (Deleted)
Patient: Angela Hodges Female    DOB: 1956-11-18   63 y.o.   MRN: [id number] Visit Date: 06/15/2019  Today's Provider: Margaretann Loveless, PA-C   No chief complaint on file.  Subjective:     Abdominal Pain Pertinent negatives include no fever, nausea or vomiting.    Allergies  Allergen Reactions  . Imitrex [Sumatriptan] Other (See Comments)    Could not speak, worsened headache  . Penicillins Hives  . Ranitidine Hcl Hives  . Sulfa Antibiotics Hives  . Tramadol Hcl Hives     Current Outpatient Medications:  .  aspirin 81 MG tablet, Take 1 tablet by mouth daily., Disp: , Rfl:  .  Cholecalciferol 1000 UNITS capsule, Take 1 capsule by mouth 3 (three) times daily., Disp: , Rfl:  .  meloxicam (MOBIC) 15 MG tablet, Take 1 tablet (15 mg total) by mouth daily., Disp: 30 tablet, Rfl: 0 .  Omega-3 Fatty Acids (FISH OIL) 1200 MG CAPS, Take by mouth., Disp: , Rfl:  .  valACYclovir (VALTREX) 1000 MG tablet, TAKE 1 TABLET BY MOUTH  DAILY (Patient not taking: Reported on 05/27/2019), Disp: 90 tablet, Rfl: 0 .  valsartan-hydrochlorothiazide (DIOVAN-HCT) 160-25 MG tablet, , Disp: , Rfl:  .  zolpidem (AMBIEN) 10 MG tablet, Take 1 tablet (10 mg total) by mouth at bedtime as needed for sleep., Disp: 90 tablet, Rfl: 0  Review of Systems  Constitutional: Negative for appetite change, chills, fatigue and fever.  Respiratory: Negative for chest tightness and shortness of breath.   Cardiovascular: Negative for chest pain and palpitations.  Gastrointestinal: Negative for abdominal pain, nausea and vomiting.  Neurological: Negative for dizziness and weakness.    Social History   Tobacco Use  . Smoking status: Never Smoker  . Smokeless tobacco: Never Used  Substance Use Topics  . Alcohol use: No      Objective:   There were no vitals taken for this visit. There were no vitals filed for this visit.   Physical Exam      Assessment & Plan        Margaretann Loveless,  PA-C  [facility]

## 2019-06-17 ENCOUNTER — Ambulatory Visit: Payer: Self-pay | Admitting: Physician Assistant

## 2019-08-10 ENCOUNTER — Other Ambulatory Visit: Payer: Self-pay

## 2019-08-10 ENCOUNTER — Telehealth: Payer: Self-pay | Admitting: Physician Assistant

## 2019-08-10 ENCOUNTER — Encounter: Payer: Self-pay | Admitting: Emergency Medicine

## 2019-08-10 ENCOUNTER — Emergency Department: Payer: Managed Care, Other (non HMO)

## 2019-08-10 ENCOUNTER — Telehealth: Payer: Self-pay

## 2019-08-10 ENCOUNTER — Emergency Department
Admission: EM | Admit: 2019-08-10 | Discharge: 2019-08-10 | Disposition: A | Discharge status: Home / Self Care | Payer: Managed Care, Other (non HMO) | Attending: Emergency Medicine | Admitting: Emergency Medicine

## 2019-08-10 DIAGNOSIS — Z79899 Other long term (current) drug therapy: Secondary | ICD-10-CM | POA: Insufficient documentation

## 2019-08-10 DIAGNOSIS — G43009 Migraine without aura, not intractable, without status migrainosus: Secondary | ICD-10-CM | POA: Diagnosis not present

## 2019-08-10 DIAGNOSIS — Z7982 Long term (current) use of aspirin: Secondary | ICD-10-CM | POA: Insufficient documentation

## 2019-08-10 DIAGNOSIS — I10 Essential (primary) hypertension: Secondary | ICD-10-CM | POA: Insufficient documentation

## 2019-08-10 DIAGNOSIS — R079 Chest pain, unspecified: Secondary | ICD-10-CM | POA: Diagnosis not present

## 2019-08-10 DIAGNOSIS — R51 Headache: Secondary | ICD-10-CM | POA: Diagnosis present

## 2019-08-10 LAB — CBC
HCT: 43.5 % (ref 36.0–46.0)
Hemoglobin: 14.3 g/dL (ref 12.0–15.0)
MCH: 30 pg (ref 26.0–34.0)
MCHC: 32.9 g/dL (ref 30.0–36.0)
MCV: 91.2 fL (ref 80.0–100.0)
Platelets: 283 10*3/uL (ref 150–400)
RBC: 4.77 MIL/uL (ref 3.87–5.11)
RDW: 13.2 % (ref 11.5–15.5)
WBC: 7.7 10*3/uL (ref 4.0–10.5)
nRBC: 0 % (ref 0.0–0.2)

## 2019-08-10 LAB — BASIC METABOLIC PANEL
Anion gap: 12 (ref 5–15)
BUN: 14 mg/dL (ref 8–23)
CO2: 25 mmol/L (ref 22–32)
Calcium: 9.7 mg/dL (ref 8.9–10.3)
Chloride: 100 mmol/L (ref 98–111)
Creatinine, Ser: 0.76 mg/dL (ref 0.44–1.00)
GFR calc Af Amer: >60 mL/min (ref >60)
GFR calc non Af Amer: >60 mL/min (ref >60)
Glucose, Bld: 146 mg/dL — ABNORMAL HIGH (ref 70–99)
Potassium: 3.5 mmol/L (ref 3.5–5.1)
Sodium: 137 mmol/L (ref 135–145)

## 2019-08-10 LAB — TROPONIN I (HIGH SENSITIVITY): Troponin I (High Sensitivity): 3 ng/L (ref <18)

## 2019-08-10 MED ORDER — SODIUM CHLORIDE 0.9 % IV BOLUS
1000.0000 mL | Freq: Once | INTRAVENOUS | Status: AC
  Administered 2019-08-10: 1000 mL via INTRAVENOUS

## 2019-08-10 MED ORDER — METOCLOPRAMIDE HCL 5 MG/ML IJ SOLN
10.0000 mg | Freq: Once | INTRAMUSCULAR | Status: AC
  Administered 2019-08-10: 10 mg via INTRAVENOUS
  Filled 2019-08-10: qty 2

## 2019-08-10 MED ORDER — BUTALBITAL-APAP-CAFFEINE 50-325-40 MG PO TABS: 1.0000 | ORAL_TABLET | Freq: Four times a day (QID) | ORAL | 0 refills | Status: DC | PRN

## 2019-08-10 MED ORDER — DIPHENHYDRAMINE HCL 50 MG/ML IJ SOLN
25.0000 mg | Freq: Once | INTRAMUSCULAR | Status: AC
  Administered 2019-08-10: 25 mg via INTRAVENOUS
  Filled 2019-08-10: qty 1

## 2019-08-10 MED ORDER — KETOROLAC TROMETHAMINE 30 MG/ML IJ SOLN
30.0000 mg | Freq: Once | INTRAMUSCULAR | Status: AC
  Administered 2019-08-10: 30 mg via INTRAVENOUS
  Filled 2019-08-10: qty 1

## 2019-08-10 MED ORDER — SODIUM CHLORIDE 0.9% FLUSH: 3.0000 mL | Freq: Once | INTRAVENOUS | Status: DC

## 2019-08-10 NOTE — ED Notes (Signed)
Full rainbow sent to lab.  

## 2019-08-10 NOTE — Telephone Encounter (Signed)
Encounter error

## 2019-08-10 NOTE — Telephone Encounter (Signed)
Patient called to notify you that she was just discharged and feeling much better. She said thank you for recommending that she go to the emergency room. No follow up needed at this time.

## 2019-08-10 NOTE — Telephone Encounter (Signed)
Agreed. Needs urgent evaluation
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
## 2019-08-10 NOTE — Telephone Encounter (Signed)
Noted  

## 2019-08-10 NOTE — Telephone Encounter (Signed)
Patient did go to ED.  

## 2019-08-10 NOTE — ED Notes (Signed)
ED Provider at bedside. 

## 2019-08-10 NOTE — ED Triage Notes (Signed)
Pt c/o headache x1wk, hx of migraine. PT also states jaw pain and back pain that started yesterday. PT states she feels heart burn as well.

## 2019-08-10 NOTE — ED Notes (Signed)
Pt presents for a migraine that started a week ago.

## 2019-08-10 NOTE — Telephone Encounter (Signed)
Patient called stating that she has been having severe migraine headaches for the last week, along with elevated blood pressure readings of 171/91 last night, and 160/90 this morning. Patient said she had a very sharp pain in her back, uncontrollable heartburn, and a sharp pain on the right side of her face that caused some temporary numbness, and felt like she had been sucking on lemons. She still has a terrible headache. No dizziness, or loss of consciousness.  She states she is still not feeling well today. Advised patient to go to ER for further evaluation. Please advise.

## 2019-09-01 ENCOUNTER — Telehealth: Payer: Self-pay

## 2019-09-01 ENCOUNTER — Other Ambulatory Visit: Payer: Self-pay | Admitting: Family Medicine

## 2019-09-01 DIAGNOSIS — G4700 Insomnia, unspecified: Secondary | ICD-10-CM

## 2019-09-01 NOTE — Telephone Encounter (Signed)
yes

## 2019-09-01 NOTE — Telephone Encounter (Signed)
Patient called wanting to be seen for recurrent headaches. Appointment scheduled tomorrow at 1:40pm. She has a history of chronic migraines.  She was seen in the ER recently for this. Negative covid screening other than headaches. Is it ok for patient to be seen in the office?

## 2019-09-01 NOTE — Progress Notes (Signed)
Patient: Angela Hodges Female    DOB: Feb 06, 1956   63 y.o.   MRN: [id number] Visit Date: 09/02/2019  Today's Provider: Margaretann Loveless, PA-C   No chief complaint on file.  Subjective:   HPI   Follow Up ER Visit  Patient is here for ER follow up.  She was recently seen at [facility] for Migraine headache Chest pain  on 08/10/2019 Treatment for this included Labs, EKG- showed sinus tachycardia 107 bpm with a narrow QRS, normal axis, normal intervals, no concerning ST changes, chest xray Normal.  She reports excellent compliance with treatment. She reports this condition is Improved. Patient reports that she was feeling better after she was seen at the ED. ------------------------------------------------------------------------------------ She is now having a dull feeling on the left side of her face during the day but after she gets off from work she gets a dull headache worsening at night that starts in her neck and radiates up the side of her face over the temporal region to the lateral eye and forehead. She is still taking the Butalb-acetamin-caff 50-325-40 without relief.   Allergies  Allergen Reactions  . Imitrex [Sumatriptan] Other (See Comments)    Could not speak, worsened headache  . Penicillins Hives  . Ranitidine Hcl Hives  . Sulfa Antibiotics Hives  . Tramadol Hcl Hives     Current Outpatient Medications:  .  aspirin 81 MG tablet, Take 1 tablet by mouth daily., Disp: , Rfl:  .  butalbital-acetaminophen-caffeine (FIORICET) 50-325-40 MG tablet, Take 1-2 tablets by mouth every 6 (six) hours as needed for headache., Disp: 20 tablet, Rfl: 0 .  Cholecalciferol 1000 UNITS capsule, Take 1 capsule by mouth 3 (three) times daily., Disp: , Rfl:  .  Omega-3 Fatty Acids (FISH OIL) 1200 MG CAPS, Take by mouth., Disp: , Rfl:  .  valACYclovir (VALTREX) 1000 MG tablet, TAKE 1 TABLET BY MOUTH  DAILY, Disp: 90 tablet, Rfl: 0 .  valsartan-hydrochlorothiazide (DIOVAN-HCT)  160-25 MG tablet, , Disp: , Rfl:  .  zolpidem (AMBIEN) 10 MG tablet, TAKE 1 TABLET BY MOUTH AT  BEDTIME AS NEEDED FOR SLEEP, Disp: 90 tablet, Rfl: 1 .  meloxicam (MOBIC) 15 MG tablet, Take 1 tablet (15 mg total) by mouth daily. (Patient not taking: Reported on 09/02/2019), Disp: 30 tablet, Rfl: 0  Review of Systems  Constitutional: Negative for appetite change, chills, diaphoresis, fatigue and fever.  Eyes: Negative for photophobia and visual disturbance.  Respiratory: Negative for cough, chest tightness and shortness of breath.   Cardiovascular: Negative for chest pain, palpitations and leg swelling.  Gastrointestinal: Negative for abdominal pain and nausea.  Neurological: Positive for headaches. Negative for dizziness, weakness, light-headedness and numbness.    Social History   Tobacco Use  . Smoking status: Never Smoker  . Smokeless tobacco: Never Used  Substance Use Topics  . Alcohol use: No      Objective:   BP (!) 147/91 (BP Location: Left Arm, Patient Position: Sitting, Cuff Size: Normal)   Pulse 86   Temp 97.8 F (36.6 C) (Other (Comment)) Comment (Src): forehead  Resp 16  Vitals:   09/02/19 1352  BP: (!) 147/91  Pulse: 86  Resp: 16  Temp: 97.8 F (36.6 C)  TempSrc: Other (Comment)  There is no height or weight on file to calculate BMI.   Physical Exam Vitals signs reviewed.  Constitutional:      General: She is not in acute distress.    Appearance: Normal appearance.  She is well-developed. She is obese. She is not ill-appearing or diaphoretic.  HENT:     Head: Normocephalic and atraumatic.     Right Ear: Tympanic membrane, ear canal and external ear normal.     Left Ear: Tympanic membrane, ear canal and external ear normal.     Nose: Nose normal.     Mouth/Throat:     Mouth: Mucous membranes are moist.  Eyes:     General: No scleral icterus.    Extraocular Movements: Extraocular movements intact.     Pupils: Pupils are equal, round, and reactive to  light.  Neck:     Musculoskeletal: Normal range of motion and neck supple.     Thyroid: No thyromegaly.     Vascular: No JVD.     Trachea: No tracheal deviation.  Cardiovascular:     Rate and Rhythm: Normal rate and regular rhythm.     Pulses: Normal pulses.     Heart sounds: Normal heart sounds. No murmur. No friction rub. No gallop.   Pulmonary:     Effort: Pulmonary effort is normal. No respiratory distress.     Breath sounds: Normal breath sounds. No wheezing or rales.  Lymphadenopathy:     Cervical: No cervical adenopathy.  Neurological:     General: No focal deficit present.     Mental Status: She is alert and oriented to person, place, and time. Mental status is at baseline.     Cranial Nerves: No cranial nerve deficit.     Motor: No weakness.     Coordination: Coordination normal.     Gait: Gait normal.  Psychiatric:        Mood and Affect: Mood normal.        Behavior: Behavior normal.        Thought Content: Thought content normal.        Judgment: Judgment normal.      No results found for any visits on 09/02/19.     Assessment & Plan    1. Insomnia, unspecified type Stable. Zolpidem 10mg  refilled.   2. Occipital neuralgia of left side Suspect possible source of her headaches. 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 Will refer to Neurology for further evaluation of the headache and consideration for nerve block if agreeable that this may be source. Will refill Fioricet for now. Depo-medrol IM given today and will start prednisone taper tomorrow for status migrainous and try to break the cycle of headache.  - butalbital-acetaminophen-caffeine (FIORICET) 50-325-40 MG tablet; Take 1-2 tablets by mouth every 6 (six) hours as needed for headache.  Dispense: 90 tablet; Refill: 1 - predniSONE (STERAPRED UNI-PAK 21 TAB) 10 MG (21) TBPK tablet; 6 day taper; take as directed on package instructions  Dispense: 21 tablet; Refill: 0 -  Ambulatory referral to Neurology - methylPREDNISolone acetate (DEPO-MEDROL) injection 80 mg  3. Migraine without aura and without status migrainosus, not intractable See above medical treatment plan. - butalbital-acetaminophen-caffeine (FIORICET) 50-325-40 MG tablet; Take 1-2 tablets by mouth every 6 (six) hours as needed for headache.  Dispense: 90 tablet; Refill: 1 - Ambulatory referral to Neurology  4. Spondylosis of cervical region without myelopathy or radiculopathy See above medical treatment plan.  5. Need for influenza vaccination Flu vaccine given today without complication. Patient sat upright for 15 minutes to check for adverse reaction before being released. - Flu Vaccine QUAD 36+ mos IM     Margaretann Loveless, PA-C  [facility]  [facility]

## 2019-09-02 ENCOUNTER — Other Ambulatory Visit: Payer: Self-pay

## 2019-09-02 ENCOUNTER — Ambulatory Visit (INDEPENDENT_AMBULATORY_CARE_PROVIDER_SITE_OTHER): Payer: Managed Care, Other (non HMO) | Admitting: Physician Assistant

## 2019-09-02 ENCOUNTER — Encounter: Payer: Self-pay | Admitting: Physician Assistant

## 2019-09-02 VITALS — BP 147/91 | HR 86 | Temp 97.8°F | Resp 16

## 2019-09-02 DIAGNOSIS — M5481 Occipital neuralgia: Secondary | ICD-10-CM

## 2019-09-02 DIAGNOSIS — G4700 Insomnia, unspecified: Secondary | ICD-10-CM | POA: Diagnosis not present

## 2019-09-02 DIAGNOSIS — G43009 Migraine without aura, not intractable, without status migrainosus: Secondary | ICD-10-CM

## 2019-09-02 DIAGNOSIS — Z23 Encounter for immunization: Secondary | ICD-10-CM | POA: Diagnosis not present

## 2019-09-02 DIAGNOSIS — M47812 Spondylosis without myelopathy or radiculopathy, cervical region: Secondary | ICD-10-CM | POA: Diagnosis not present

## 2019-09-02 MED ORDER — METHYLPREDNISOLONE ACETATE 40 MG/ML IJ SUSP
80.0000 mg | Freq: Once | INTRAMUSCULAR | Status: AC
  Administered 2019-09-02: 80 mg via INTRAMUSCULAR

## 2019-09-02 MED ORDER — PREDNISONE 10 MG (21) PO TBPK: ORAL_TABLET | ORAL | 0 refills | Status: DC

## 2019-09-02 MED ORDER — METHYLPREDNISOLONE ACETATE 80 MG/ML IJ SUSP: 80.0000 mg | Freq: Once | INTRAMUSCULAR | Status: DC

## 2019-09-02 MED ORDER — BUTALBITAL-APAP-CAFFEINE 50-325-40 MG PO TABS: 1.0000 | ORAL_TABLET | Freq: Four times a day (QID) | ORAL | 1 refills | Status: DC | PRN

## 2019-09-02 NOTE — Telephone Encounter (Signed)
See Rx request ° °

## 2019-09-02 NOTE — Patient Instructions (Signed)
Occipital Neuralgia  Occipital neuralgia is a type of headache that causes brief episodes of very bad pain in the back of your head. Pain from occipital neuralgia may spread (radiate) to other parts of your head. These headaches may be caused by irritation of the nerves that leave your spinal cord high up in your neck, just below the base of your skull (occipital nerves). Your occipital nerves transmit sensations from the back of your head, the top of your head, and the areas behind your ears. What are the causes? This condition can occur without any known cause (primary headache syndrome). In other cases, this condition is caused by pressure on or irritation of one of the two occipital nerves. Pressure and irritation may be due to:  Muscle spasm in the neck.  Neck injury.  Wear and tear of the vertebrae in the neck (osteoarthritis).  Disease of the disks that separate the vertebrae.  Swollen blood vessels that put pressure on the occipital nerves.  Infections.  Tumors.  Diabetes. What are the signs or symptoms? This condition causes brief burning, stabbing, electric, shocking, or shooting pain which can radiate to the top of the head. It can happen on one side or both sides of the head. It can also cause:  Pain behind the eye.  Pain triggered by neck movement or hair brushing.  Scalp tenderness.  Aching in the back of the head between episodes of very bad pain.  Pain gets worse with exposure to bright lights. How is this diagnosed? There is no test that diagnoses this condition. Your health care provider may diagnose this condition based on a physical exam and your symptoms. Other tests may be done, such as:  Imaging studies of the brain and neck (cervical spine), such as an MRI or CT scan. These look for causes of pinched nerves.  Applying pressure to the nerves in the neck to try to re-create the pain.  Injection of numbing medicine into the occipital nerve areas to see if  pain goes away (diagnostic nerve block). How is this treated? Treatment for this condition may begin with simple measures, such as:  Rest.  Massage.  Applying heat or cold on the area.  Over-the-counter pain relievers. If these measures do not work, you may need other treatments, including:  Medicines, such as: ? Prescription-strength anti-inflammatory medicines. ? Muscle relaxants. ? Anti-seizure medicines, which can relieve pain. ? Antidepressants, which can relieve pain. ? Injected medicines, such as medicines that numb the area (local anesthetic) and steroids.  Pulsed radiofrequency ablation. This is when wires are implanted to deliver electrical impulses that block pain signals from the occipital nerve.  Surgery to relieve nerve pressure.  Physical therapy. Follow these instructions at home: Pain management      Avoid any activities that cause pain.  Rest when you have an attack of pain.  Try gentle massage to relieve pain.  Try a different pillow or sleeping position.  If directed, apply heat to the affected area as told by your health care provider. Use the heat source that your health care provider recommends, such as a moist heat pack or a heating pad. ? Place a towel between your skin and the heat source. ? Leave the heat on for 20-30 minutes. ? Remove the heat if your skin turns bright red. This is especially important if you are unable to feel pain, heat, or cold. You may have a greater risk of getting burned.  If directed, apply ice to the   back of the head and neck area as told by your health care provider. ? Put ice in a plastic bag. ? Place a towel between your skin and the bag. ? Leave the ice on for 20 minutes, 2-3 times per day. General instructions  Take over-the-counter and prescription medicines only as told by your health care provider.  Avoid things that make your symptoms worse, such as bright lights.  Try to stay active. Get regular  exercise that does not cause pain. Ask your health care provider to suggest safe exercises for you.  Work with a physical therapist to learn stretching exercises you can do at home.  Practice good posture.  Keep all follow-up visits as told by your health care provider. This is important. Contact a health care provider if:  Your medicine is not working.  You have new or worsening symptoms. Get help right away if:  You have very bad head pain that does not go away.  You have a sudden change in vision, balance, or speech. Summary  Occipital neuralgia is a type of headache that causes brief episodes of very bad pain in the back of your head.  Pain from occipital neuralgia may spread (radiate) to other parts of your head.  Treatment for this condition includes rest, massage, and medicines. This information is not intended to replace advice given to you by your health care provider. Make sure you discuss any questions you have with your health care provider. Document Released: 12/09/2001 Document Revised: 12/01/2017 Document Reviewed: 02/19/2017 Elsevier Patient Education  2020 [employer]  

## 2019-09-08 ENCOUNTER — Other Ambulatory Visit: Payer: Self-pay

## 2019-09-08 ENCOUNTER — Encounter: Payer: Self-pay | Admitting: Emergency Medicine

## 2019-09-08 DIAGNOSIS — R51 Headache: Secondary | ICD-10-CM | POA: Diagnosis not present

## 2019-09-08 DIAGNOSIS — Z8673 Personal history of transient ischemic attack (TIA), and cerebral infarction without residual deficits: Secondary | ICD-10-CM | POA: Diagnosis not present

## 2019-09-08 DIAGNOSIS — I10 Essential (primary) hypertension: Secondary | ICD-10-CM | POA: Diagnosis not present

## 2019-09-08 DIAGNOSIS — Z7982 Long term (current) use of aspirin: Secondary | ICD-10-CM | POA: Insufficient documentation

## 2019-09-08 DIAGNOSIS — Z79899 Other long term (current) drug therapy: Secondary | ICD-10-CM | POA: Insufficient documentation

## 2019-09-08 DIAGNOSIS — M542 Cervicalgia: Secondary | ICD-10-CM | POA: Insufficient documentation

## 2019-09-08 LAB — BASIC METABOLIC PANEL
Anion gap: 10 (ref 5–15)
BUN: 27 mg/dL — ABNORMAL HIGH (ref 8–23)
CO2: 29 mmol/L (ref 22–32)
Calcium: 9.6 mg/dL (ref 8.9–10.3)
Chloride: 103 mmol/L (ref 98–111)
Creatinine, Ser: 1.06 mg/dL — ABNORMAL HIGH (ref 0.44–1.00)
GFR calc Af Amer: >60 mL/min (ref >60)
GFR calc non Af Amer: 57 mL/min — ABNORMAL LOW (ref >60)
Glucose, Bld: 113 mg/dL — ABNORMAL HIGH (ref 70–99)
Potassium: 3.5 mmol/L (ref 3.5–5.1)
Sodium: 142 mmol/L (ref 135–145)

## 2019-09-08 LAB — CBC
HCT: 42.4 % (ref 36.0–46.0)
Hemoglobin: 13.8 g/dL (ref 12.0–15.0)
MCH: 30.2 pg (ref 26.0–34.0)
MCHC: 32.5 g/dL (ref 30.0–36.0)
MCV: 92.8 fL (ref 80.0–100.0)
Platelets: 284 10*3/uL (ref 150–400)
RBC: 4.57 MIL/uL (ref 3.87–5.11)
RDW: 13.3 % (ref 11.5–15.5)
WBC: 12.3 10*3/uL — ABNORMAL HIGH (ref 4.0–10.5)
nRBC: 0 % (ref 0.0–0.2)

## 2019-09-08 MED ORDER — SODIUM CHLORIDE 0.9% FLUSH: 3.0000 mL | Freq: Once | INTRAVENOUS | Status: DC

## 2019-09-08 NOTE — ED Notes (Signed)
Pt states hx of complicated migraines

## 2019-09-08 NOTE — ED Triage Notes (Addendum)
Pt c/o intermit Lft sided numbness in her face and neck x 2wks. Pt states her PCP told her she had pinched nerve. Pt state worse the past few hours. PT A&OX4. C/o back pain and " feeling like I'm going to pass out"

## 2019-09-09 ENCOUNTER — Emergency Department
Admission: EM | Admit: 2019-09-09 | Discharge: 2019-09-09 | Disposition: A | Discharge status: Home / Self Care | Payer: Managed Care, Other (non HMO) | Attending: Emergency Medicine | Admitting: Emergency Medicine

## 2019-09-09 DIAGNOSIS — M542 Cervicalgia: Secondary | ICD-10-CM

## 2019-09-09 NOTE — ED Notes (Signed)
Dr. Goodman at the bedside for pt evaluation. 

## 2019-09-09 NOTE — ED Notes (Signed)
Pt states she is feeling much better.
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
## 2019-09-09 NOTE — ED Notes (Signed)
MD at the bedside  

## 2019-09-09 NOTE — Discharge Instructions (Addendum)
Please seek medical attention for any high fevers, chest pain, shortness of breath, change in behavior, persistent vomiting, bloody stool or any other new or concerning symptoms.  

## 2019-09-16 ENCOUNTER — Ambulatory Visit: Payer: Managed Care, Other (non HMO) | Admitting: Physician Assistant

## 2019-09-23 ENCOUNTER — Other Ambulatory Visit: Payer: Self-pay | Admitting: Family Medicine

## 2019-09-23 ENCOUNTER — Other Ambulatory Visit: Payer: Self-pay | Admitting: Physician Assistant

## 2019-09-23 NOTE — Telephone Encounter (Signed)
See Rx request ° °

## 2019-09-23 NOTE — Telephone Encounter (Signed)
This medication was stopped due to allergic reaction. Could not speak when she took it.

## 2019-09-23 NOTE — Telephone Encounter (Signed)
I don't see this on pt's med list. Please advise.   Thanks,   -Mickel Baas

## 2019-09-23 NOTE — Telephone Encounter (Signed)
[facility] faxed refill request for the following medications: ° °SUMAtriptan (IMITREX) 100 MG tablet  ° ° °Please advise. °

## 2019-09-26 ENCOUNTER — Other Ambulatory Visit: Payer: Self-pay | Admitting: Physician Assistant

## 2019-09-26 MED ORDER — VALACYCLOVIR HCL 1 G PO TABS: 1000.0000 mg | ORAL_TABLET | Freq: Every day | ORAL | 0 refills | Status: DC

## 2019-09-26 NOTE — Telephone Encounter (Signed)
[facility] faxed refill request for the following medications:  valACYclovir (VALTREX) 1000 MG tablet  LOV: 09/02/2019 Please advise. Thanks TNP

## 2019-10-19 ENCOUNTER — Telehealth: Payer: Self-pay
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
## 2019-10-20 NOTE — Telephone Encounter (Signed)
noted 

## 2019-10-24 ENCOUNTER — Other Ambulatory Visit (HOSPITAL_COMMUNITY): Payer: Self-pay | Admitting: Neurology

## 2019-10-24 ENCOUNTER — Other Ambulatory Visit: Payer: Self-pay | Admitting: Neurology

## 2019-10-24 DIAGNOSIS — G43119 Migraine with aura, intractable, without status migrainosus: Secondary | ICD-10-CM

## 2019-10-31 DIAGNOSIS — M7071 Other bursitis of hip, right hip: Secondary | ICD-10-CM | POA: Insufficient documentation

## 2019-11-03 ENCOUNTER — Ambulatory Visit
Admission: RE | Admit: 2019-11-03 | Discharge: 2019-11-03 | Disposition: A | Discharge status: Home / Self Care | Payer: Managed Care, Other (non HMO) | Source: Ambulatory Visit | Attending: Neurology | Admitting: Neurology

## 2019-11-03 ENCOUNTER — Other Ambulatory Visit: Payer: Self-pay

## 2019-11-03 ENCOUNTER — Other Ambulatory Visit: Payer: Self-pay | Admitting: Neurology

## 2019-11-03 DIAGNOSIS — G43119 Migraine with aura, intractable, without status migrainosus: Secondary | ICD-10-CM

## 2019-11-10 ENCOUNTER — Encounter: Payer: Self-pay | Admitting: Physician Assistant

## 2019-11-11 ENCOUNTER — Ambulatory Visit: Payer: Managed Care, Other (non HMO) | Admitting: Adult Health

## 2019-11-11 ENCOUNTER — Encounter: Payer: Self-pay | Admitting: Adult Health

## 2019-11-11 ENCOUNTER — Other Ambulatory Visit: Payer: Self-pay

## 2019-11-11 VITALS — BP 152/82 | HR 96 | Temp 97.9°F | Resp 16

## 2019-11-11 DIAGNOSIS — Z8669 Personal history of other diseases of the nervous system and sense organs: Secondary | ICD-10-CM

## 2019-11-11 DIAGNOSIS — Z8639 Personal history of other endocrine, nutritional and metabolic disease: Secondary | ICD-10-CM

## 2019-11-11 DIAGNOSIS — I10 Essential (primary) hypertension: Secondary | ICD-10-CM | POA: Diagnosis not present

## 2019-11-11 DIAGNOSIS — F329 Major depressive disorder, single episode, unspecified: Secondary | ICD-10-CM

## 2019-11-11 DIAGNOSIS — Z6841 Body Mass Index (BMI) 40.0 and over, adult: Secondary | ICD-10-CM | POA: Diagnosis not present

## 2019-11-11 DIAGNOSIS — F32A Depression, unspecified: Secondary | ICD-10-CM

## 2019-11-11 DIAGNOSIS — F419 Anxiety disorder, unspecified: Secondary | ICD-10-CM

## 2019-11-11 MED ORDER — DULOXETINE HCL 30 MG PO CPEP: 30.0000 mg | ORAL_CAPSULE | Freq: Every day | ORAL | 0 refills | Status: DC

## 2019-11-11 MED ORDER — VALSARTAN 80 MG PO TABS: 80.0000 mg | ORAL_TABLET | Freq: Every day | ORAL | 0 refills | Status: DC

## 2019-11-11 NOTE — Progress Notes (Signed)
Patient: Angela Hodges Female    DOB: [birth year]   63 y.o.   MRN: [id number] Visit Date: 11/11/2019  Today's Provider: Jairo Ben, FNP   Chief Complaint  Patient presents with  . Hypertension   Subjective:    HPI  Hypertension, follow-up:  BP Readings from Last 3 Encounters:  11/11/19 (!) 152/82  09/09/19 (!) 194/112  09/02/19 (!) 147/91    She was last seen for hypertension 6 months ago ( virtual visit).  BP at that visit was 160/90. Management changes since that visit include none. She reports excellent compliance with treatment. She is not having side effects.  She is not exercising. She is not adherent to low salt diet.   Outside blood pressures are being checked periodically recent systolic 199 and diastolic reading >85. She is experiencing none.  Patient denies chest pain, chest pressure/discomfort, claudication, dyspnea, exertional chest pressure/discomfort, fatigue, irregular heart beat, lower extremity edema, near-syncope, orthopnea, palpitations, paroxysmal nocturnal dyspnea, syncope and tachypnea.   Cardiovascular risk factors include advanced age (older than 18 for men, 63 for women), hypertension and obesity (BMI >= 30 kg/m2).  Use of agents associated with hypertension: NSAIDS.     She has a follow up appointment with neurology in February. Dr. Sherryll Burger, neurology.  B/P reading 162/92 at that time. She has had pain with migraines during these blood pressure readings at times  but reports that it has stayed elevated for the past two months. She reports neurology told her to follow up with PCP for hypertension and may need to start a statin for risk reduction.  She denies any cardiac or respiratory symptoms. She denies any migraine today. Denies any pain currently.   She is not watching her diet, she eats a lot of salt, she reports she is trying to decrease it. She is trying to walk despite an old foot injury. However she has not been  exercising the past two months.   She has not had lipids done here in the past three years. She has had elevated creatinine 1.06 BUN 27. She has a family history of sister with one functioning kidney found in her 82's.   She also reports increased anxiety and depression symptoms, not sleeping as well at night and concerned with Covid 19 and the other normal stressors of life.  She denies any homicidal or suicidal ideations or plans.  Weight trend: stable Wt Readings from Last 3 Encounters:  08/10/19 258 lb (117 kg)  05/27/19 255 lb (115.7 kg)  09/24/18 258 lb 3.2 oz (117.1 kg)    Current diet: well balanced, high soduim, not heart healthy  ------------------------------------------------------------------------  Allergies  Allergen Reactions  . Imitrex [Sumatriptan] Other (See Comments)    Could not speak, worsened headache  . Penicillins Hives  . Ranitidine Hcl Hives  . Sulfa Antibiotics Hives  . Tramadol Hcl Hives     Current Outpatient Medications:  .  acetaminophen-codeine (TYLENOL #3) 300-30 MG tablet, Take 1 tablet by mouth every 6 (six) hours., Disp: , Rfl:  .  aspirin 81 MG tablet, Take 1 tablet by mouth daily., Disp: , Rfl:  .  Cholecalciferol 1000 UNITS capsule, Take 1 capsule by mouth 3 (three) times daily., Disp: , Rfl:  .  meloxicam (MOBIC) 15 MG tablet, Take 1 tablet (15 mg total) by mouth daily. (Patient not taking: Reported on 09/02/2019), Disp: 30 tablet, Rfl: 0 .  methocarbamol (ROBAXIN) 500 MG tablet, Take 500 mg by mouth 2 (  two) times daily., Disp: , Rfl:  .  methylPREDNISolone (MEDROL DOSEPAK) 4 MG TBPK tablet, 6 DAY DOSE PACK - FOLLOW DIRECTIONS ON PACKAGE PER MD, Disp: , Rfl:  .  Omega-3 Fatty Acids (FISH OIL) 1200 MG CAPS, Take by mouth., Disp: , Rfl:  .  ondansetron (ZOFRAN) 4 MG tablet, Take 4 mg by mouth every 8 (eight) hours as needed., Disp: , Rfl:  .  predniSONE (DELTASONE) 10 MG tablet, prednisone 10 mg tablet, Disp: , Rfl:  .  topiramate (TOPAMAX)  25 MG tablet, SMARTSIG:3 Tablet(s) By Mouth Every Other Day PRN, Disp: , Rfl:  .  valACYclovir (VALTREX) 1000 MG tablet, Take 1 tablet (1,000 mg total) by mouth daily., Disp: 90 tablet, Rfl: 0 .  valsartan-hydrochlorothiazide (DIOVAN-HCT) 160-25 MG tablet, , Disp: , Rfl:  .  zolpidem (AMBIEN) 10 MG tablet, TAKE 1 TABLET BY MOUTH AT  BEDTIME AS NEEDED FOR SLEEP, Disp: 90 tablet, Rfl: 1  Review of Systems  Constitutional: Positive for fatigue. Negative for activity change, appetite change, chills, diaphoresis, fever and unexpected weight change.  HENT: Negative.   Eyes: Negative.  Negative for photophobia.  Respiratory: Negative.   Cardiovascular: Negative.   Gastrointestinal: Negative.   Genitourinary: Negative.   Musculoskeletal: Positive for arthralgias.  Neurological: Positive for headaches. Negative for dizziness, tremors, seizures, syncope, facial asymmetry, speech difficulty, weakness, light-headedness and numbness.  Hematological: Negative.   Psychiatric/Behavioral: Positive for sleep disturbance. Negative for agitation, behavioral problems, confusion, decreased concentration, dysphoric mood, hallucinations, self-injury and suicidal ideas. The patient is nervous/anxious. The patient is not hyperactive.     Social History   Tobacco Use  . Smoking status: Never Smoker  . Smokeless tobacco: Never Used  Substance Use Topics  . Alcohol use: No     Objective:   BP (!) 152/82   Pulse 96   Temp 97.9 F (36.6 C) (Oral)   Resp 16  Vitals:   11/11/19 1505  BP: (!) 152/82  Pulse: 96  Resp: 16  Temp: 97.9 F (36.6 C)  TempSrc: Oral  There is no height or weight on file to calculate BMI.   Physical Exam Vitals signs reviewed.  Constitutional:      Appearance: Normal appearance. She is obese.  HENT:     Head: Normocephalic and atraumatic.     Nose: Nose normal. No congestion or rhinorrhea.     Mouth/Throat:     Mouth: Mucous membranes are moist.     Pharynx: No  oropharyngeal exudate or posterior oropharyngeal erythema.  Eyes:     Extraocular Movements: Extraocular movements intact.     Conjunctiva/sclera: Conjunctivae normal.     Pupils: Pupils are equal, round, and reactive to light.  Neck:     Musculoskeletal: Normal range of motion and neck supple. No neck rigidity or muscular tenderness.     Vascular: No carotid bruit.  Cardiovascular:     Rate and Rhythm: Normal rate and regular rhythm.     Pulses: Normal pulses.     Heart sounds: Normal heart sounds.  Pulmonary:     Effort: Pulmonary effort is normal. No respiratory distress.     Breath sounds: Normal breath sounds. No stridor. No wheezing, rhonchi or rales.  Chest:     Chest wall: No tenderness.  Abdominal:     Palpations: Abdomen is soft.  Musculoskeletal: Normal range of motion.  Lymphadenopathy:     Cervical: No cervical adenopathy.  Skin:    General: Skin is warm and dry.  Capillary Refill: Capillary refill takes less than 2 seconds.  Neurological:     General: No focal deficit present.     Mental Status: She is alert and oriented to person, place, and time. Mental status is at baseline.     Cranial Nerves: No cranial nerve deficit.     Sensory: No sensory deficit.     Motor: No weakness.     Coordination: Coordination normal.     Gait: Gait normal.     Deep Tendon Reflexes: Reflexes normal.  Psychiatric:        Mood and Affect: Mood normal.        Behavior: Behavior normal.        Thought Content: Thought content normal.        Judgment: Judgment normal.        Assessment & Plan    1. Essential (primary) hypertension Will continue her current medication and add on Diovan 80 mg po daily to current regimen. Will recheck blood  pressure in office in two weeks. Discussed possible side effects.  Meds ordered this encounter  Medications  . DULoxetine (CYMBALTA) 30 MG capsule    Sig: Take 1 capsule (30 mg total) by mouth daily.    Dispense:  30 capsule    Refill:   0  . valsartan (DIOVAN) 80 MG tablet    Sig: Take 1 tablet (80 mg total) by mouth daily.    Dispense:  30 tablet    Refill:  0    2. BMI 40.0-44.9, adult (HCC) Dietary changes discussed, decrease sodium, fatty foods.  Increase exercise at least 30 minutes daily. Weight loss encouraged. Discussed dietary and lifestyle changes at this visit.Risk reduction discussed.   3. History of hyperlipidemia  Will recheck her lipid panel and labs- she has not had in last two years per Epic. She will return to office to discuss with Margaretann Loveless, PA-C starting a statin for  risk reduction related to her MRI and lab results.   Orders Placed This Encounter  Procedures  . CBC with Differential/Platelet [id number]  . Comprehensive Metabolic Panel (CMET)  . TSH  . HgB A1c  . Lipid panel  . VITAMIN D 25 Hydroxy (Vit-D Deficiency, Fractures)  . B12  She is a [employer] employee and prefers to go to [facility] nect week for fasting labs.   4. History of migraine Reviewed MRI showing vascular changes. Will do risk reduction trying to control hypertension and hyperlipidemia with patients efforts for lifestyle changes. Keep regular follow up with neurology and report any new or changing symptoms.   5. Anxiety Stress reducing measures/ relaxation discussed. Sleep hygiene discussed.  GAD 7 performed.  6. Depression, unspecified depression type Cymbalta started as above. Discussed Black Box Warning and when to seek immediate medical attention. PHQ9 and GAD7 performed.   Sleep hygiene discussed. Follow up for repeat PHQ9 and GAD 7 with Margaretann Loveless, PA-C in 4 weeks.    Discussed plan with back up supervising physician Dr. Beryle Flock  who is in agreement with plan above. Will have room to still increase Diovan if needed for hypertension. Will plan to start statin after labs and recheck with .Margaretann Loveless, PA-C    The entirety of the information documented in the History of Present Illness,  Review of Systems and Physical Exam were personally obtained by me. Portions of this information were initially documented by the  Certified Medical Assistant whose name is documented in Epic and reviewed by me for  thoroughness and accuracy.  I have personally performed the exam and reviewed the chart and it is accurate to the best of my knowledge.  Museum/gallery conservator has been used and any errors in dictation or transcription are unintentional.  Eula Fried. Tarig Zimmers FNP-C  [facility]   Jairo Ben, FNP  [facility] [place] as a scribe for [employer] Antoniette Peake, FNP.,have documented all relevant documentation on the behalf of Jairo Ben, FNP,as directed by  Jairo Ben, FNP while in the presence of Jairo Ben, FNP.

## 2019-11-11 NOTE — Patient Instructions (Addendum)
Heart-Healthy Eating Plan Heart-healthy meal planning includes:  Eating less unhealthy fats.  Eating more healthy fats.  Making other changes in your diet. Talk with your doctor or a diet specialist (dietitian) to create an eating plan that is right for you. What is my plan? Your doctor may recommend an eating plan that includes:  Total fat: ______% or less of total calories a day.  Saturated fat: ______% or less of total calories a day.  Cholesterol: less than _________mg a day. What are tips for following this plan? Cooking Avoid frying your food. Try to bake, boil, grill, or broil it instead. You can also reduce fat by:  Removing the skin from poultry.  Removing all visible fats from meats.  Steaming vegetables in water or broth. Meal planning   At meals, divide your plate into four equal parts: ? Fill one-half of your plate with vegetables and green salads. ? Fill one-fourth of your plate with whole grains. ? Fill one-fourth of your plate with lean protein foods.  Eat 4-5 servings of vegetables per day. A serving of vegetables is: ? 1 cup of raw or cooked vegetables. ? 2 cups of raw leafy greens.  Eat 4-5 servings of fruit per day. A serving of fruit is: ? 1 medium whole fruit. ?  cup of dried fruit. ?  cup of fresh, frozen, or canned fruit. ?  cup of 100% fruit juice.  Eat more foods that have soluble fiber. These are apples, broccoli, carrots, beans, peas, and barley. Try to get 20-30 g of fiber per day.  Eat 4-5 servings of nuts, legumes, and seeds per week: ? 1 serving of dried beans or legumes equals  cup after being cooked. ? 1 serving of nuts is  cup. ? 1 serving of seeds equals 1 tablespoon. General information  Eat more home-cooked food. Eat less restaurant, buffet, and fast food.  Limit or avoid alcohol.  Limit foods that are high in starch and sugar.  Avoid fried foods.  Lose weight if you are overweight.  Keep track of how much salt  (sodium) you eat. This is important if you have high blood pressure. Ask your doctor to tell you more about this.  Try to add vegetarian meals each week. Fats  Choose healthy fats. These include olive oil and canola oil, flaxseeds, walnuts, almonds, and seeds.  Eat more omega-3 fats. These include salmon, mackerel, sardines, tuna, flaxseed oil, and ground flaxseeds. Try to eat fish at least 2 times each week.  Check food labels. Avoid foods with trans fats or high amounts of saturated fat.  Limit saturated fats. ? These are often found in animal products, such as meats, butter, and cream. ? These are also found in plant foods, such as palm oil, palm kernel oil, and coconut oil.  Avoid foods with partially hydrogenated oils in them. These have trans fats. Examples are stick margarine, some tub margarines, cookies, crackers, and other baked goods. What foods can I eat? Fruits All fresh, canned (in natural juice), or frozen fruits. Vegetables Fresh or frozen vegetables (raw, steamed, roasted, or grilled). Green salads. Grains Most grains. Choose whole wheat and whole grains most of the time. Rice and pasta, including brown rice and pastas made with whole wheat. Meats and other proteins Lean, well-trimmed beef, veal, pork, and lamb. Chicken and [place] without skin. All fish and shellfish. Wild duck, rabbit, pheasant, and venison. Egg whites or low-cholesterol egg substitutes. Dried beans, peas, lentils, and tofu. Seeds and most  nuts. Dairy Low-fat or nonfat cheeses, including ricotta and mozzarella. Skim or 1% milk that is liquid, powdered, or evaporated. Buttermilk that is made with low-fat milk. Nonfat or low-fat yogurt. Fats and oils Non-hydrogenated (trans-free) margarines. Vegetable oils, including soybean, sesame, sunflower, olive, peanut, safflower, corn, canola, and cottonseed. Salad dressings or mayonnaise made with a vegetable oil. Beverages Mineral water. Coffee and tea. Diet  carbonated beverages. Sweets and desserts Sherbet, gelatin, and fruit ice. Small amounts of dark chocolate. Limit all sweets and desserts. Seasonings and condiments All seasonings and condiments. The items listed above may not be a complete list of foods and drinks you can eat. Contact a dietitian for more options. What foods should I avoid? Fruits Canned fruit in heavy syrup. Fruit in cream or butter sauce. Fried fruit. Limit coconut. Vegetables Vegetables cooked in cheese, cream, or butter sauce. Fried vegetables. Grains Breads that are made with saturated or trans fats, oils, or whole milk. Croissants. Sweet rolls. Donuts. High-fat crackers, such as cheese crackers. Meats and other proteins Fatty meats, such as hot dogs, ribs, sausage, bacon, rib-eye roast or steak. High-fat deli meats, such as salami and bologna. Caviar. Domestic duck and goose. Organ meats, such as liver. Dairy Cream, sour cream, cream cheese, and creamed cottage cheese. Whole-milk cheeses. Whole or 2% milk that is liquid, evaporated, or condensed. Whole buttermilk. Cream sauce or high-fat cheese sauce. Yogurt that is made from whole milk. Fats and oils Meat fat, or shortening. Cocoa butter, hydrogenated oils, palm oil, coconut oil, palm kernel oil. Solid fats and shortenings, including bacon fat, salt pork, lard, and butter. Nondairy cream substitutes. Salad dressings with cheese or sour cream. Beverages Regular sodas and juice drinks with added sugar. Sweets and desserts Frosting. Pudding. Cookies. Cakes. Pies. Milk chocolate or white chocolate. Buttered syrups. Full-fat ice cream or ice cream drinks. The items listed above may not be a complete list of foods and drinks to avoid. Contact a dietitian for more information. Summary  Heart-healthy meal planning includes eating less unhealthy fats, eating more healthy fats, and making other changes in your diet.  Eat a balanced diet. This includes fruits and  vegetables, low-fat or nonfat dairy, lean protein, nuts and legumes, whole grains, and heart-healthy oils and fats. This information is not intended to replace advice given to you by your health care provider. Make sure you discuss any questions you have with your health care provider. Document Released: 06/15/2012 Document Revised: 02/18/2018 Document Reviewed: 01/22/2018 Elsevier Patient Education  2020 [employer]. Managing Your Hypertension Hypertension is commonly called high blood pressure. This is when the force of your blood pressing against the walls of your arteries is too strong. Arteries are blood vessels that carry blood from your heart throughout your body. Hypertension forces the heart to work harder to pump blood, and may cause the arteries to become narrow or stiff. Having untreated or uncontrolled hypertension can cause heart attack, stroke, kidney disease, and other problems. What are blood pressure readings? A blood pressure reading consists of a higher number over a lower number. Ideally, your blood pressure should be below 120/80. The first ("top") number is called the systolic pressure. It is a measure of the pressure in your arteries as your heart beats. The second ("bottom") number is called the diastolic pressure. It is a measure of the pressure in your arteries as the heart relaxes. What does my blood pressure reading mean? Blood pressure is classified into four stages. Based on your blood pressure reading, your  health care provider may use the following stages to determine what type of treatment you need, if any. Systolic pressure and diastolic pressure are measured in a unit called mm Hg. Normal  Systolic pressure: below 120.  Diastolic pressure: below 80. Elevated  Systolic pressure: 120-129.  Diastolic pressure: below 80. Hypertension stage 1  Systolic pressure: 130-139.  Diastolic pressure: 80-89. Hypertension stage 2  Systolic pressure: 140 or  above.  Diastolic pressure: 90 or above. What health risks are associated with hypertension? Managing your hypertension is an important responsibility. Uncontrolled hypertension can lead to:  A heart attack.  A stroke.  A weakened blood vessel (aneurysm).  Heart failure.  Kidney damage.  Eye damage.  Metabolic syndrome.  Memory and concentration problems. What changes can I make to manage my hypertension? Hypertension can be managed by making lifestyle changes and possibly by taking medicines. Your health care provider will help you make a plan to bring your blood pressure within a normal range. Eating and drinking   Eat a diet that is high in fiber and potassium, and low in salt (sodium), added sugar, and fat. An example eating plan is called the DASH (Dietary Approaches to Stop Hypertension) diet. To eat this way: ? Eat plenty of fresh fruits and vegetables. Try to fill half of your plate at each meal with fruits and vegetables. ? Eat whole grains, such as whole wheat pasta, brown rice, or whole grain bread. Fill about one quarter of your plate with whole grains. ? Eat low-fat diary products. ? Avoid fatty cuts of meat, processed or cured meats, and poultry with skin. Fill about one quarter of your plate with lean proteins such as fish, chicken without skin, beans, eggs, and tofu. ? Avoid premade and processed foods. These tend to be higher in sodium, added sugar, and fat.  Reduce your daily sodium intake. Most people with hypertension should eat less than 1,500 mg of sodium a day.  Limit alcohol intake to no more than 1 drink a day for nonpregnant women and 2 drinks a day for men. One drink equals 12 oz of beer, 5 oz of wine, or 1 oz of hard liquor. Lifestyle  Work with your health care provider to maintain a healthy body weight, or to lose weight. Ask what an ideal weight is for you.  Get at least 30 minutes of exercise that causes your heart to beat faster (aerobic  exercise) most days of the week. Activities may include walking, swimming, or biking.  Include exercise to strengthen your muscles (resistance exercise), such as weight lifting, as part of your weekly exercise routine. Try to do these types of exercises for 30 minutes at least 3 days a week.  Do not use any products that contain nicotine or tobacco, such as cigarettes and e-cigarettes. If you need help quitting, ask your health care provider.  Control any long-term (chronic) conditions you have, such as high cholesterol or diabetes. Monitoring  Monitor your blood pressure at home as told by your health care provider. Your personal target blood pressure may vary depending on your medical conditions, your age, and other factors.  Have your blood pressure checked regularly, as often as told by your health care provider. Working with your health care provider  Review all the medicines you take with your health care provider because there may be side effects or interactions.  Talk with your health care provider about your diet, exercise habits, and other lifestyle factors that may be contributing to  hypertension.  Visit your health care provider regularly. Your health care provider can help you create and adjust your plan for managing hypertension. Will I need medicine to control my blood pressure? Your health care provider may prescribe medicine if lifestyle changes are not enough to get your blood pressure under control, and if:  Your systolic blood pressure is 130 or higher.  Your diastolic blood pressure is 80 or higher. Take medicines only as told by your health care provider. Follow the directions carefully. Blood pressure medicines must be taken as prescribed. The medicine does not work as well when you skip doses. Skipping doses also puts you at risk for problems. Contact a health care provider if:  You think you are having a reaction to medicines you have taken.  You have repeated  (recurrent) headaches.  You feel dizzy.  You have swelling in your ankles.  You have trouble with your vision. Get help right away if:  You develop a severe headache or confusion.  You have unusual weakness or numbness, or you feel faint.  You have severe pain in your chest or abdomen.  You vomit repeatedly.  You have trouble breathing. Summary  Hypertension is when the force of blood pumping through your arteries is too strong. If this condition is not controlled, it may put you at risk for serious complications.  Your personal target blood pressure may vary depending on your medical conditions, your age, and other factors. For most people, a normal blood pressure is less than 120/80.  Hypertension is managed by lifestyle changes, medicines, or both. Lifestyle changes include weight loss, eating a healthy, low-sodium diet, exercising more, and limiting alcohol. This information is not intended to replace advice given to you by your health care provider. Make sure you discuss any questions you have with your health care provider. Document Released: 09/08/2012 Document Revised: 04/08/2019 Document Reviewed: 11/12/2016 Elsevier Patient Education  2020 [employer] Hypertension, Adult Hypertension is another name for high blood pressure. High blood pressure forces your heart to work harder to pump blood. This can cause problems over time. There are two numbers in a blood pressure reading. There is a top number (systolic) over a bottom number (diastolic). It is best to have a blood pressure that is below 120/80. Healthy choices can help lower your blood pressure, or you may need medicine to help lower it. What are the causes? The cause of this condition is not known. Some conditions may be related to high blood pressure. What increases the risk?  Smoking.  Having type 2 diabetes mellitus, high cholesterol, or both.  Not getting enough exercise or physical activity.  Being  overweight.  Having too much fat, sugar, calories, or salt (sodium) in your diet.  Drinking too much alcohol.  Having long-term (chronic) kidney disease.  Having a family history of high blood pressure.  Age. Risk increases with age.  Race. You may be at higher risk if you are African American.  Gender. Men are at higher risk than women before age 61. After age 61, women are at higher risk than men.  Having obstructive sleep apnea.  Stress. What are the signs or symptoms?  High blood pressure may not cause symptoms. Very high blood pressure (hypertensive crisis) may cause: ? Headache. ? Feelings of worry or nervousness (anxiety). ? Shortness of breath. ? Nosebleed. ? A feeling of being sick to your stomach (nausea). ? Throwing up (vomiting). ? Changes in how you see. ? Very bad chest pain. ?  Seizures. How is this treated?  This condition is treated by making healthy lifestyle changes, such as: ? Eating healthy foods. ? Exercising more. ? Drinking less alcohol.  Your health care provider may prescribe medicine if lifestyle changes are not enough to get your blood pressure under control, and if: ? Your top number is above 130. ? Your bottom number is above 80.  Your personal target blood pressure may vary. Follow these instructions at home: Eating and drinking   If told, follow the DASH eating plan. To follow this plan: ? Fill one half of your plate at each meal with fruits and vegetables. ? Fill one fourth of your plate at each meal with whole grains. Whole grains include whole-wheat pasta, brown rice, and whole-grain bread. ? Eat or drink low-fat dairy products, such as skim milk or low-fat yogurt. ? Fill one fourth of your plate at each meal with low-fat (lean) proteins. Low-fat proteins include fish, chicken without skin, eggs, beans, and tofu. ? Avoid fatty meat, cured and processed meat, or chicken with skin. ? Avoid pre-made or processed food.  Eat less  than 1,500 mg of salt each day.  Do not drink alcohol if: ? Your doctor tells you not to drink. ? You are pregnant, may be pregnant, or are planning to become pregnant.  If you drink alcohol: ? Limit how much you use to:  0-1 drink a day for women.  0-2 drinks a day for men. ? Be aware of how much alcohol is in your drink. In the [place] one drink equals one 12 oz bottle of beer (355 mL), one 5 oz glass of wine (148 mL), or one 1 oz glass of hard liquor (44 mL). Lifestyle   Work with your doctor to stay at a healthy weight or to lose weight. Ask your doctor what the best weight is for you.  Get at least 30 minutes of exercise most days of the week. This may include walking, swimming, or biking.  Get at least 30 minutes of exercise that strengthens your muscles (resistance exercise) at least 3 days a week. This may include lifting weights or doing Pilates.  Do not use any products that contain nicotine or tobacco, such as cigarettes, e-cigarettes, and chewing tobacco. If you need help quitting, ask your doctor.  Check your blood pressure at home as told by your doctor.  Keep all follow-up visits as told by your doctor. This is important. Medicines  Take over-the-counter and prescription medicines only as told by your doctor. Follow directions carefully.  Do not skip doses of blood pressure medicine. The medicine does not work as well if you skip doses. Skipping doses also puts you at risk for problems.  Ask your doctor about side effects or reactions to medicines that you should watch for. Contact a doctor if you:  Think you are having a reaction to the medicine you are taking.  Have headaches that keep coming back (recurring).  Feel dizzy.  Have swelling in your ankles.  Have trouble with your vision. Get help right away if you:  Get a very bad headache.  Start to feel mixed up (confused).  Feel weak or numb.  Feel faint.  Have very bad pain in  your: ? Chest. ? Belly (abdomen).  Throw up more than once.  Have trouble breathing. Summary  Hypertension is another name for high blood pressure.  High blood pressure forces your heart to work harder to pump blood.  For most people,  a normal blood pressure is less than 120/80.  Making healthy choices can help lower blood pressure. If your blood pressure does not get lower with healthy choices, you may need to take medicine. This information is not intended to replace advice given to you by your health care provider. Make sure you discuss any questions you have with your health care provider. Document Released: 06/02/2008 Document Revised: 08/25/2018 Document Reviewed: 08/25/2018 Elsevier Patient Education  2020 [employer] Inc[employer]   Escitalopram tablets What is this medicine? ESCITALOPRAM (es sye TAL oh pram) is used to treat depression and certain types of anxiety. This medicine may be used for other purposes; ask your health care provider or pharmacist if you have questions. COMMON BRAND NAME(S): Lexapro What should I tell my health care provider before I take this medicine? They need to know if you have any of these conditions:  bipolar disorder or a family history of bipolar disorder  diabetes  glaucoma  heart disease  kidney or liver disease  receiving electroconvulsive therapy  seizures (convulsions)  suicidal thoughts, plans, or attempt by you or a family member  an unusual or allergic reaction to escitalopram, the related drug citalopram, other medicines, foods, dyes, or preservatives  pregnant or trying to become pregnant  breast-feeding How should I use this medicine? Take this medicine by mouth with a glass of water. Follow the directions on the prescription label. You can take it with or without food. If it upsets your stomach, take it with food. Take your medicine at regular intervals. Do not take it more often than directed. Do not stop taking this  medicine suddenly except upon the advice of your doctor. Stopping this medicine too quickly may cause serious side effects or your condition may worsen. A special MedGuide will be given to you by the pharmacist with each prescription and refill. Be sure to read this information carefully each time. Talk to your pediatrician regarding the use of this medicine in children. Special care may be needed. Overdosage: If you think you have taken too much of this medicine contact a poison control center or emergency room at once. NOTE: This medicine is only for you. Do not share this medicine with others. What if I miss a dose? If you miss a dose, take it as soon as you can. If it is almost time for your next dose, take only that dose. Do not take double or extra doses. What may interact with this medicine? Do not take this medicine with any of the following medications:  certain medicines for fungal infections like fluconazole, itraconazole, ketoconazole, posaconazole, voriconazole  cisapride  citalopram  dronedarone  linezolid  MAOIs like Carbex, Eldepryl, Marplan, Nardil, and Parnate  methylene blue (injected into a vein)  pimozide  thioridazine This medicine may also interact with the following medications:  alcohol  amphetamines  aspirin and aspirin-like medicines  carbamazepine  certain medicines for depression, anxiety, or psychotic disturbances  certain medicines for migraine headache like almotriptan, eletriptan, frovatriptan, naratriptan, rizatriptan, sumatriptan, zolmitriptan  certain medicines for sleep  certain medicines that treat or prevent blood clots like warfarin, enoxaparin, dalteparin  cimetidine  diuretics  dofetilide  fentanyl  furazolidone  isoniazid  lithium  metoprolol  NSAIDs, medicines for pain and inflammation, like ibuprofen or naproxen  other medicines that prolong the QT interval (cause an abnormal heart  rhythm)  procarbazine  rasagiline  supplements like St. John's wort, kava kava, valerian  tramadol  tryptophan  ziprasidone This list may not  describe all possible interactions. Give your health care provider a list of all the medicines, herbs, non-prescription drugs, or dietary supplements you use. Also tell them if you smoke, drink alcohol, or use illegal drugs. Some items may interact with your medicine. What should I watch for while using this medicine? Tell your doctor if your symptoms do not get better or if they get worse. Visit your doctor or health care professional for regular checks on your progress. Because it may take several weeks to see the full effects of this medicine, it is important to continue your treatment as prescribed by your doctor. Patients and their families should watch out for new or worsening thoughts of suicide or depression. Also watch out for sudden changes in feelings such as feeling anxious, agitated, panicky, irritable, hostile, aggressive, impulsive, severely restless, overly excited and hyperactive, or not being able to sleep. If this happens, especially at the beginning of treatment or after a change in dose, call your health care professional. Dennis Bast may get drowsy or dizzy. Do not drive, use machinery, or do anything that needs mental alertness until you know how this medicine affects you. Do not stand or sit up quickly, especially if you are an older patient. This reduces the risk of dizzy or fainting spells. Alcohol may interfere with the effect of this medicine. Avoid alcoholic drinks. Your mouth may get dry. Chewing sugarless gum or sucking hard candy, and drinking plenty of water may help. Contact your doctor if the problem does not go away or is severe. What side effects may I notice from receiving this medicine? Side effects that you should report to your doctor or health care professional as soon as possible:  allergic reactions like skin rash,  itching or hives, swelling of the face, lips, or tongue  anxious  black, tarry stools  changes in vision  confusion  elevated mood, decreased need for sleep, racing thoughts, impulsive behavior  eye pain  fast, irregular heartbeat  feeling faint or lightheaded, falls  feeling agitated, angry, or irritable  hallucination, loss of contact with reality  loss of balance or coordination  loss of memory  painful or prolonged erections  restlessness, pacing, inability to keep still  seizures  stiff muscles  suicidal thoughts or other mood changes  trouble sleeping  unusual bleeding or bruising  unusually weak or tired  vomiting Side effects that usually do not require medical attention (report to your doctor or health care professional if they continue or are bothersome):  changes in appetite  change in sex drive or performance  headache  increased sweating  indigestion, nausea  tremors This list may not describe all possible side effects. Call your doctor for medical advice about side effects. You may report side effects to FDA at 1-800-FDA-1088. Where should I keep my medicine? Keep out of reach of children. Store at room temperature between 15 and 30 degrees C (59 and 86 degrees F). Throw away any unused medicine after the expiration date. NOTE: This sheet is a summary. It may not cover all possible information. If you have questions about this medicine, talk to your doctor, pharmacist, or health care provider.  2020 Elsevier/Gold Standard (2018-12-06 11:21:44) Valsartan tablets What is this medicine? VALSARTAN (val SAR tan) is used to treat high blood pressure. This drug is also used to treat patients with heart failure and patients who have had a heart attack. This medicine may be used for other purposes; ask your health care provider or pharmacist if  you have questions. COMMON BRAND NAME(S): Diovan What should I tell my health care provider before I  take this medicine? They need to know if you have any of these conditions:  heart failure  kidney disease  liver disease  an unusual or allergic reaction to valsartan, other medicines, foods, dyes, or preservatives  pregnant or trying to get pregnant  breast-feeding How should I use this medicine? Take this medicine by mouth with a glass of water. Follow the directions on the prescription label. This medicine can be taken with or without food. Take your medicine at regular intervals. Do not take it more often than directed. Talk to your pediatrician regarding the use of this medicine in children. While this drug may be prescribed for children as young as 6 years for selected conditions, precautions do apply. Overdosage: If you think you have taken too much of this medicine contact a poison control center or emergency room at once. NOTE: This medicine is only for you. Do not share this medicine with others. What if I miss a dose? If you miss a dose, take it as soon as you can. If it is almost time for your next dose, take only that dose. Do not take double or extra doses. What may interact with this medicine?  blood pressure medicines  lithium  diuretics, especially triamterene, spironolactone or amiloride  potassium salts or potassium supplements This list may not describe all possible interactions. Give your health care provider a list of all the medicines, herbs, non-prescription drugs, or dietary supplements you use. Also tell them if you smoke, drink alcohol, or use illegal drugs. Some items may interact with your medicine. What should I watch for while using this medicine? Visit your doctor or health care professional for regular checks on your progress. Check your blood pressure as directed. Ask your doctor or health care professional what your blood pressure should be and when you should contact him or her. Call your doctor or health care professional if you notice an irregular  or fast heart beat. Women should inform their doctor if they wish to become pregnant or think they might be pregnant. There is a potential for serious side effects to an unborn child, particularly in the second or third trimester. Talk to your health care professional or pharmacist for more information. You may get drowsy or dizzy. Do not drive, use machinery, or do anything that needs mental alertness until you know how this drug affects you. Do not stand or sit up quickly, especially if you are an older patient. This reduces the risk of dizzy or fainting spells. Alcohol can make you more drowsy and dizzy. Avoid alcoholic drinks. Avoid salt substitutes unless you are told otherwise by your doctor or health care professional. Do not treat yourself for coughs, colds, or pain while you are taking this medicine without asking your doctor or health care professional for advice. Some ingredients may increase your blood pressure. What side effects may I notice from receiving this medicine? Side effects that you should report to your doctor or health care professional as soon as possible:  confusion, dizziness, light headedness or fainting spells  decreased amount of urine passed  difficulty breathing or swallowing, hoarseness, or tightening of the throat  fast or irregular heart beat, palpitations, or chest pain  skin rash, itching  swelling of your face, lips, tongue, hands, or feet Side effects that usually do not require medical attention (report to your doctor or health care professional if  they continue or are bothersome):  cough  decreased sexual function  headache  nausea or stomach pain This list may not describe all possible side effects. Call your doctor for medical advice about side effects. You may report side effects to FDA at 1-800-FDA-1088. Where should I keep my medicine? Keep out of the reach of children. Store at room temperature between 15 and 30 degrees C (59 and 86  degrees F). Keep your medicine container tightly closed and protect from moisture. Throw away any unused medicine after the expiration date. NOTE: This sheet is a summary. It may not cover all possible information. If you have questions about this medicine, talk to your doctor, pharmacist, or health care provider.  2020 Elsevier/Gold Standard (2013-03-17 12:39:59) Duloxetine delayed-release capsules What is this medicine? DULOXETINE (doo LOX e teen) is used to treat depression, anxiety, and different types of chronic pain. This medicine may be used for other purposes; ask your health care provider or pharmacist if you have questions. COMMON BRAND NAME(S): Cymbalta, Murrell Converse, Irenka What should I tell my health care provider before I take this medicine? They need to know if you have any of these conditions:  bipolar disorder  glaucoma  high blood pressure  kidney disease  liver disease  seizures  suicidal thoughts, plans or attempt; a previous suicide attempt by you or a family member  take medicines that treat or prevent blood clots  taken medicines called MAOIs like Carbex, Eldepryl, Marplan, Nardil, and Parnate within 14 days  trouble passing urine  an unusual reaction to duloxetine, other medicines, foods, dyes, or preservatives  pregnant or trying to get pregnant  breast-feeding How should I use this medicine? Take this medicine by mouth with a glass of water. Follow the directions on the prescription label. Do not crush, cut or chew some capsules of this medicine. Some capsules may be opened and sprinkled on applesauce. Check with your doctor or pharmacist if you are not sure. You can take this medicine with or without food. Take your medicine at regular intervals. Do not take your medicine more often than directed. Do not stop taking this medicine suddenly except upon the advice of your doctor. Stopping this medicine too quickly may cause serious side effects or your  condition may worsen. A special MedGuide will be given to you by the pharmacist with each prescription and refill. Be sure to read this information carefully each time. Talk to your pediatrician regarding the use of this medicine in children. While this drug may be prescribed for children as young as 2 years of age for selected conditions, precautions do apply. Overdosage: If you think you have taken too much of this medicine contact a poison control center or emergency room at once. NOTE: This medicine is only for you. Do not share this medicine with others. What if I miss a dose? If you miss a dose, take it as soon as you can. If it is almost time for your next dose, take only that dose. Do not take double or extra doses. What may interact with this medicine? Do not take this medicine with any of the following medications:  desvenlafaxine  levomilnacipran  linezolid  MAOIs like Carbex, Eldepryl, Marplan, Nardil, and Parnate  methylene blue (injected into a vein)  milnacipran  thioridazine  venlafaxine This medicine may also interact with the following medications:  alcohol  amphetamines  aspirin and aspirin-like medicines  certain antibiotics like ciprofloxacin and enoxacin  certain medicines for blood pressure, heart  disease, irregular heart beat  certain medicines for depression, anxiety, or psychotic disturbances  certain medicines for migraine headache like almotriptan, eletriptan, frovatriptan, naratriptan, rizatriptan, sumatriptan, zolmitriptan  certain medicines that treat or prevent blood clots like warfarin, enoxaparin, and dalteparin  cimetidine  fentanyl  lithium  NSAIDS, medicines for pain and inflammation, like ibuprofen or naproxen  phentermine  procarbazine  rasagiline  sibutramine  St. John's wort  theophylline  tramadol  tryptophan This list may not describe all possible interactions. Give your health care provider a list of all the  medicines, herbs, non-prescription drugs, or dietary supplements you use. Also tell them if you smoke, drink alcohol, or use illegal drugs. Some items may interact with your medicine. What should I watch for while using this medicine? Tell your doctor if your symptoms do not get better or if they get worse. Visit your doctor or healthcare provider for regular checks on your progress. Because it may take several weeks to see the full effects of this medicine, it is important to continue your treatment as prescribed by your doctor. This medicine may cause serious skin reactions. They can happen weeks to months after starting the medicine. Contact your healthcare provider right away if you notice fevers or flu-like symptoms with a rash. The rash may be red or purple and then turn into blisters or peeling of the skin. Or, you might notice a red rash with swelling of the face, lips, or lymph nodes in your neck or under your arms. Patients and their families should watch out for new or worsening thoughts of suicide or depression. Also watch out for sudden changes in feelings such as feeling anxious, agitated, panicky, irritable, hostile, aggressive, impulsive, severely restless, overly excited and hyperactive, or not being able to sleep. If this happens, especially at the beginning of treatment or after a change in dose, call your healthcare provider. You may get drowsy or dizzy. Do not drive, use machinery, or do anything that needs mental alertness until you know how this medicine affects you. Do not stand or sit up quickly, especially if you are an older patient. This reduces the risk of dizzy or fainting spells. Alcohol may interfere with the effect of this medicine. Avoid alcoholic drinks. This medicine can cause an increase in blood pressure. This medicine can also cause a sudden drop in your blood pressure, which may make you feel faint and increase the chance of a fall. These effects are most common when you  first start the medicine or when the dose is increased, or during use of other medicines that can cause a sudden drop in blood pressure. Check with your doctor for instructions on monitoring your blood pressure while taking this medicine. Your mouth may get dry. Chewing sugarless gum or sucking hard candy, and drinking plenty of water, may help. Contact your doctor if the problem does not go away or is severe. What side effects may I notice from receiving this medicine? Side effects that you should report to your doctor or health care professional as soon as possible:  allergic reactions like skin rash, itching or hives, swelling of the face, lips, or tongue  anxious  breathing problems  confusion  changes in vision  chest pain  confusion  elevated mood, decreased need for sleep, racing thoughts, impulsive behavior  eye pain  fast, irregular heartbeat  feeling faint or lightheaded, falls  feeling agitated, angry, or irritable  hallucination, loss of contact with reality  high blood pressure  loss  of balance or coordination  palpitations  redness, blistering, peeling or loosening of the skin, including inside the mouth  restlessness, pacing, inability to keep still  seizures  stiff muscles  suicidal thoughts or other mood changes  trouble passing urine or change in the amount of urine  trouble sleeping  unusual bleeding or bruising  unusually weak or tired  vomiting  yellowing of the eyes or skin Side effects that usually do not require medical attention (report to your doctor or health care professional if they continue or are bothersome):  change in sex drive or performance  change in appetite or weight  constipation  dizziness  dry mouth  headache  increased sweating  nausea  tired This list may not describe all possible side effects. Call your doctor for medical advice about side effects. You may report side effects to FDA at  1-800-FDA-1088. Where should I keep my medicine? Keep out of the reach of children. Store at room temperature between 15 and 30 degrees C (59 to 86 degrees F). Throw away any unused medicine after the expiration date. NOTE: This sheet is a summary. It may not cover all possible information. If you have questions about this medicine, talk to your doctor, pharmacist, or health care provider.  2020 Elsevier/Gold Standard (2019-03-17 13:47:50)  Generalized Anxiety Disorder, Adult Generalized anxiety disorder (GAD) is a mental health disorder. People with this condition constantly worry about everyday events. Unlike normal anxiety, worry related to GAD is not triggered by a specific event. These worries also do not fade or get better with time. GAD interferes with life functions, including relationships, work, and school. GAD can vary from mild to severe. People with severe GAD can have intense waves of anxiety with physical symptoms (panic attacks). What are the causes? The exact cause of GAD is not known. What increases the risk? This condition is more likely to develop in:  Women.  People who have a family history of anxiety disorders.  People who are very shy.  People who experience very stressful life events, such as the death of a loved one.  People who have a very stressful family environment. What are the signs or symptoms? People with GAD often worry excessively about many things in their lives, such as their health and family. They may also be overly concerned about:  Doing well at work.  Being on time.  Natural disasters.  Friendships. Physical symptoms of GAD include:  Fatigue.  Muscle tension or having muscle twitches.  Trembling or feeling shaky.  Being easily startled.  Feeling like your heart is pounding or racing.  Feeling out of breath or like you cannot take a deep breath.  Having trouble falling asleep or staying asleep.  Sweating.  Nausea,  diarrhea, or irritable bowel syndrome (IBS).  Headaches.  Trouble concentrating or remembering facts.  Restlessness.  Irritability. How is this diagnosed? Your health care provider can diagnose GAD based on your symptoms and medical history. You will also have a physical exam. The health care provider will ask specific questions about your symptoms, including how severe they are, when they started, and if they come and go. Your health care provider may ask you about your use of alcohol or drugs, including prescription medicines. Your health care provider may refer you to a mental health specialist for further evaluation. Your health care provider will do a thorough examination and may perform additional tests to rule out other possible causes of your symptoms. To be diagnosed with  GAD, a person must have anxiety that:  Is out of his or her control.  Affects several different aspects of his or her life, such as work and relationships.  Causes distress that makes him or her unable to take part in normal activities.  Includes at least three physical symptoms of GAD, such as restlessness, fatigue, trouble concentrating, irritability, muscle tension, or sleep problems. Before your health care provider can confirm a diagnosis of GAD, these symptoms must be present more days than they are not, and they must last for six months or longer. How is this treated? The following therapies are usually used to treat GAD:  Medicine. Antidepressant medicine is usually prescribed for long-term daily control. Antianxiety medicines may be added in severe cases, especially when panic attacks occur.  Talk therapy (psychotherapy). Certain types of talk therapy can be helpful in treating GAD by providing support, education, and guidance. Options include: ? Cognitive behavioral therapy (CBT). People learn coping skills and techniques to ease their anxiety. They learn to identify unrealistic or negative thoughts  and behaviors and to replace them with positive ones. ? Acceptance and commitment therapy (ACT). This treatment teaches people how to be mindful as a way to cope with unwanted thoughts and feelings. ? Biofeedback. This process trains you to manage your body's response (physiological response) through breathing techniques and relaxation methods. You will work with a therapist while machines are used to monitor your physical symptoms.  Stress management techniques. These include yoga, meditation, and exercise. A mental health specialist can help determine which treatment is best for you. Some people see improvement with one type of therapy. However, other people require a combination of therapies. Follow these instructions at home:  Take over-the-counter and prescription medicines only as told by your health care provider.  Try to maintain a normal routine.  Try to anticipate stressful situations and allow extra time to manage them.  Practice any stress management or self-calming techniques as taught by your health care provider.  Do not punish yourself for setbacks or for not making progress.  Try to recognize your accomplishments, even if they are small.  Keep all follow-up visits as told by your health care provider. This is important. Contact a health care provider if:  Your symptoms do not get better.  Your symptoms get worse.  You have signs of depression, such as: ? A persistently sad, cranky, or irritable mood. ? Loss of enjoyment in activities that used to bring you joy. ? Change in weight or eating. ? Changes in sleeping habits. ? Avoiding friends or family members. ? Loss of energy for normal tasks. ? Feelings of guilt or worthlessness. Get help right away if:  You have serious thoughts about hurting yourself or others. If you ever feel like you may hurt yourself or others, or have thoughts about taking your own life, get help right away. You can go to your nearest  emergency department or call:  Your local emergency services (911 in the [place]  A suicide crisis helpline, such as the National Suicide Prevention Lifeline at (639)815-3788. This is open 24 hours a day. Summary  Generalized anxiety disorder (GAD) is a mental health disorder that involves worry that is not triggered by a specific event.  People with GAD often worry excessively about many things in their lives, such as their health and family.  GAD may cause physical symptoms such as restlessness, trouble concentrating, sleep problems, frequent sweating, nausea, diarrhea, headaches, and trembling or muscle twitching.  A mental health specialist can help determine which treatment is best for you. Some people see improvement with one type of therapy. However, other people require a combination of therapies. This information is not intended to replace advice given to you by your health care provider. Make sure you discuss any questions you have with your health care provider. Document Released: 04/11/2013 Document Revised: 11/27/2017 Document Reviewed: 11/04/2016 Elsevier Patient Education  2020 [employer].

## 2019-11-14 ENCOUNTER — Encounter: Payer: Self-pay | Admitting: Physician Assistant

## 2019-11-15 LAB — COMPREHENSIVE METABOLIC PANEL
ALT: 34 IU/L — ABNORMAL HIGH (ref 0–32)
AST: 18 IU/L (ref 0–40)
Albumin/Globulin Ratio: 2 (ref 1.2–2.2)
Albumin: 4.5 g/dL (ref 3.8–4.8)
Alkaline Phosphatase: 73 IU/L (ref 39–117)
BUN/Creatinine Ratio: 21 (ref 12–28)
BUN: 19 mg/dL (ref 8–27)
Bilirubin Total: 0.3 mg/dL (ref 0.0–1.2)
CO2: 25 mmol/L (ref 20–29)
Calcium: 9.7 mg/dL (ref 8.7–10.3)
Chloride: 94 mmol/L — ABNORMAL LOW (ref 96–106)
Creatinine, Ser: 0.9 mg/dL (ref 0.57–1.00)
GFR calc Af Amer: 80 mL/min/{1.73_m2} (ref >59)
GFR calc non Af Amer: 69 mL/min/{1.73_m2} (ref >59)
Globulin, Total: 2.3 g/dL (ref 1.5–4.5)
Glucose: 99 mg/dL (ref 65–99)
Potassium: 3.8 mmol/L (ref 3.5–5.2)
Sodium: 138 mmol/L (ref 134–144)
Total Protein: 6.8 g/dL (ref 6.0–8.5)

## 2019-11-15 LAB — HEMOGLOBIN A1C
Est. average glucose Bld gHb Est-mCnc: 140 mg/dL
Hgb A1c MFr Bld: 6.5 % — ABNORMAL HIGH (ref 4.8–5.6)

## 2019-11-15 LAB — VITAMIN B12: Vitamin B-12: >2000 pg/mL — ABNORMAL HIGH (ref 232–1245)

## 2019-11-17 NOTE — Progress Notes (Signed)
Will you call patient and let her know that her Vitamin B 12 is elevated, if she is taking supplement she should discontinue it and discuss with Sonia Baller next week.  Her kidney function is normal, she has mildly elevated ALT liver enzyme but still within normal variant.  Hemoglobin A 1C elevated at 6.5 it was 6.4 two years ago. She should monitor her dietary intake of carbohydrates, and glucose and increase exercise as tolerated. Follow up with Joanna Hews for blood pressure recheck since adding on medication. Recheck per PCP.  Also her TSH, lipid panel, and CBC have not resulted. I believe with the lab error she will have to repeat the CBC and if needed will you check on the other labs and have her do those fasting as well at the same time. Her follow up next week was to discuss lipids and b/p check. She works for [employer] and can go there to have them done.

## 2019-11-17 NOTE — Addendum Note (Signed)
Addended by: Minette Headland on: 11/17/2019 02:20 PM   Modules accepted: Orders

## 2019-11-18 NOTE — Progress Notes (Signed)
Patient: Angela Hodges Female    DOB: 12/14/1956   63 y.o.   MRN: [id number] Visit Date: 11/21/2019  Today's Provider: Margaretann Loveless, PA-C   Chief Complaint  Patient presents with  . Follow-up    hypertension   Subjective:    I,Joseline E. Rosas,RMA am acting as a Neurosurgeon for [employer], PA-C.  HPI   Hypertension, follow-up:  BP Readings from Last 3 Encounters:  11/21/19 (!) 135/94  11/11/19 (!) 152/82  09/09/19 (!) 194/112    She was last seen for hypertension 1 weeks ago.  BP at that visit was 152/82 Management since that visit includes continue current medication and add on Diovan 80 mg po daily to current regimen.   She reports excellent compliance with treatment. She is not having side effects.    Outside blood pressures are  160/80's-90's when she first started the medication and int he last week they have been 140's/upper 80's-low number 90's. Weight trend: stable Wt Readings from Last 3 Encounters:  11/21/19 254 lb 6.4 oz (115.4 kg)  08/10/19 258 lb (117 kg)  05/27/19 255 lb (115.7 kg)    Current diet: in general, an "unhealthy" diet  ------------------------------------------------------------------------ Depression: She has not taken this medication because she is scared that her blood pressure will go up.  She also stopped the Topiramate that the Neurologist prescribed her because she took it one time and noticed the her blood pressure went up.  Allergies  Allergen Reactions  . Imitrex [Sumatriptan] Other (See Comments)    Could not speak, worsened headache  . Penicillins Hives  . Ranitidine Hcl Hives  . Sulfa Antibiotics Hives  . Tramadol Hcl Hives     Current Outpatient Medications:  .  acetaminophen-codeine (TYLENOL #3) 300-30 MG tablet, Take 1 tablet by mouth every 6 (six) hours., Disp: , Rfl:  .  aspirin 81 MG tablet, Take 1 tablet by mouth daily., Disp: , Rfl:  .  Cholecalciferol 1000 UNITS capsule, Take 1  capsule by mouth 3 (three) times daily., Disp: , Rfl:  .  meloxicam (MOBIC) 15 MG tablet, Take 1 tablet (15 mg total) by mouth daily., Disp: 30 tablet, Rfl: 0 .  methocarbamol (ROBAXIN) 500 MG tablet, Take 500 mg by mouth 2 (two) times daily., Disp: , Rfl:  .  Omega-3 Fatty Acids (FISH OIL) 1200 MG CAPS, Take by mouth., Disp: , Rfl:  .  ondansetron (ZOFRAN) 4 MG tablet, Take 4 mg by mouth every 8 (eight) hours as needed., Disp: , Rfl:  .  valACYclovir (VALTREX) 1000 MG tablet, Take 1 tablet (1,000 mg total) by mouth daily., Disp: 90 tablet, Rfl: 0 .  valsartan (DIOVAN) 80 MG tablet, Take 1 tablet (80 mg total) by mouth daily., Disp: 90 tablet, Rfl: 3 .  valsartan-hydrochlorothiazide (DIOVAN-HCT) 160-25 MG tablet, Take 1 tablet by mouth daily., Disp: 30 tablet, Rfl: 0 .  zolpidem (AMBIEN) 10 MG tablet, TAKE 1 TABLET BY MOUTH AT  BEDTIME AS NEEDED FOR SLEEP, Disp: 90 tablet, Rfl: 1 .  amLODipine (NORVASC) 5 MG tablet, Take 1 tablet (5 mg total) by mouth daily., Disp: 30 tablet, Rfl: 0 .  DULoxetine (CYMBALTA) 30 MG capsule, Take 1 capsule (30 mg total) by mouth daily. (Patient not taking: Reported on 11/21/2019), Disp: 30 capsule, Rfl: 0 .  topiramate (TOPAMAX) 25 MG tablet, SMARTSIG:3 Tablet(s) By Mouth Every Other Day PRN, Disp: , Rfl:   Review of Systems  Constitutional: Negative.   Eyes:  Negative for visual disturbance.  Respiratory: Negative.   Cardiovascular: Negative.   Neurological: Positive for headaches.  Psychiatric/Behavioral: The patient is nervous/anxious.     Social History   Tobacco Use  . Smoking status: Never Smoker  . Smokeless tobacco: Never Used  Substance Use Topics  . Alcohol use: No      Objective:   BP (!) 135/94 (BP Location: Left Wrist, Patient Position: Sitting, Cuff Size: Large)   Pulse 86   Temp (!) 97.5 F (36.4 C) (Temporal)   Resp 16   Wt 254 lb 6.4 oz (115.4 kg)   BMI 42.33 kg/m  Vitals:   11/21/19 0814  BP: (!) 135/94  Pulse: 86  Resp: 16   Temp: (!) 97.5 F (36.4 C)  TempSrc: Temporal  Weight: 254 lb 6.4 oz (115.4 kg)  Body mass index is 42.33 kg/m.   Physical Exam Vitals signs reviewed.  Constitutional:      General: She is not in acute distress.    Appearance: Normal appearance. She is well-developed. She is obese. She is not ill-appearing or diaphoretic.  Neck:     Musculoskeletal: Normal range of motion and neck supple.     Thyroid: No thyromegaly.     Vascular: No JVD.     Trachea: No tracheal deviation.  Cardiovascular:     Rate and Rhythm: Normal rate and regular rhythm.     Pulses: Normal pulses.     Heart sounds: Normal heart sounds. No murmur. No friction rub. No gallop.   Pulmonary:     Effort: Pulmonary effort is normal. No respiratory distress.     Breath sounds: Normal breath sounds. No wheezing or rales.  Lymphadenopathy:     Cervical: No cervical adenopathy.  Neurological:     Mental Status: She is alert.      No results found for any visits on 11/21/19.     Assessment & Plan    1. Essential (primary) hypertension Still elevated. Continue Valsartan-HCTZ 160-25mg , Valsartan 80mg  and add amlodipine 5mg  as below. I will see her back on 12/09/19 for her CPE. - amLODipine (NORVASC) 5 MG tablet; Take 1 tablet (5 mg total) by mouth daily.  Dispense: 30 tablet; Refill: 0 - valsartan-hydrochlorothiazide (DIOVAN-HCT) 160-25 MG tablet; Take 1 tablet by mouth daily.  Dispense: 30 tablet; Refill: 0 - valsartan (DIOVAN) 80 MG tablet; Take 1 tablet (80 mg total) by mouth daily.  Dispense: 90 tablet; Refill: 3  2. Class 3 severe obesity due to excess calories with serious comorbidity and body mass index (BMI) of 40.0 to 44.9 in adult [facility]) Counseled patient on healthy lifestyle modifications including dieting and exercise.   3. Reactive depression Due to her health issues currently. Discussed Cymbalta more and she is agreeable to give this a try. Will see how she is tolerating when she returns on  12/09/19.     Margaretann Loveless, PA-C  [facility]

## 2019-11-21 ENCOUNTER — Ambulatory Visit: Payer: Managed Care, Other (non HMO) | Admitting: Physician Assistant

## 2019-11-21 ENCOUNTER — Other Ambulatory Visit: Payer: Self-pay

## 2019-11-21 ENCOUNTER — Other Ambulatory Visit: Payer: Self-pay | Admitting: Physician Assistant

## 2019-11-21 ENCOUNTER — Encounter: Payer: Self-pay | Admitting: Physician Assistant

## 2019-11-21 VITALS — BP 135/94 | HR 86 | Temp 97.5°F | Resp 16 | Wt 254.4 lb

## 2019-11-21 DIAGNOSIS — Z6841 Body Mass Index (BMI) 40.0 and over, adult: Secondary | ICD-10-CM | POA: Diagnosis not present

## 2019-11-21 DIAGNOSIS — F329 Major depressive disorder, single episode, unspecified: Secondary | ICD-10-CM

## 2019-11-21 DIAGNOSIS — E7800 Pure hypercholesterolemia, unspecified: Secondary | ICD-10-CM

## 2019-11-21 DIAGNOSIS — I10 Essential (primary) hypertension: Secondary | ICD-10-CM

## 2019-11-21 DIAGNOSIS — Z1329 Encounter for screening for other suspected endocrine disorder: Secondary | ICD-10-CM

## 2019-11-21 MED ORDER — AMLODIPINE BESYLATE 5 MG PO TABS: 5.0000 mg | ORAL_TABLET | Freq: Every day | ORAL | 0 refills | Status: DC

## 2019-11-21 MED ORDER — VALSARTAN 80 MG PO TABS: 80.0000 mg | ORAL_TABLET | Freq: Every day | ORAL | 3 refills | Status: DC

## 2019-11-21 MED ORDER — VALSARTAN-HYDROCHLOROTHIAZIDE 160-25 MG PO TABS: 1.0000 | ORAL_TABLET | Freq: Every day | ORAL | 0 refills | Status: DC

## 2019-11-21 NOTE — Progress Notes (Signed)
Labs ordered.

## 2019-11-21 NOTE — Patient Instructions (Signed)
Amlodipine tablets What is this medicine? AMLODIPINE (am LOE di peen) is a calcium-channel blocker. It affects the amount of calcium found in your heart and muscle cells. This relaxes your blood vessels, which can reduce the amount of work the heart has to do. This medicine is used to lower high blood pressure. It is also used to prevent chest pain. This medicine may be used for other purposes; ask your health care provider or pharmacist if you have questions. COMMON BRAND NAME(S): Norvasc What should I tell my health care provider before I take this medicine? They need to know if you have any of these conditions:  heart disease  liver disease  an unusual or allergic reaction to amlodipine, other medicines, foods, dyes, or preservatives  pregnant or trying to get pregnant  breast-feeding How should I use this medicine? Take this medicine by mouth with a glass of water. Follow the directions on the prescription label. You can take it with or without food. If it upsets your stomach, take it with food. Take your medicine at regular intervals. Do not take it more often than directed. Do not stop taking except on your doctor's advice. Talk to your pediatrician regarding the use of this medicine in children. While this drug may be prescribed for children as young as 6 years for selected conditions, precautions do apply. Patients over 65 years of age may have a stronger reaction and need a smaller dose. Overdosage: If you think you have taken too much of this medicine contact a poison control center or emergency room at once. NOTE: This medicine is only for you. Do not share this medicine with others. What if I miss a dose? If you miss a dose, take it as soon as you can. If it is almost time for your next dose, take only that dose. Do not take double or extra doses. What may interact with this medicine? Do not take this medicine with any of the following medications:  tranylcypromine This  medicine may also interact with the following medications:  clarithromycin  cyclosporine  diltiazem  itraconazole  simvastatin  tacrolimus This list may not describe all possible interactions. Give your health care provider a list of all the medicines, herbs, non-prescription drugs, or dietary supplements you use. Also tell them if you smoke, drink alcohol, or use illegal drugs. Some items may interact with your medicine. What should I watch for while using this medicine? Visit your healthcare professional for regular checks on your progress. Check your blood pressure as directed. Ask your healthcare professional what your blood pressure should be and when you should contact him or her. Do not treat yourself for coughs, colds, or pain while you are using this medicine without asking your healthcare professional for advice. Some medicines may increase your blood pressure. You may get dizzy. Do not drive, use machinery, or do anything that needs mental alertness until you know how this medicine affects you. Do not stand or sit up quickly, especially if you are an older patient. This reduces the risk of dizzy or fainting spells. Avoid alcoholic drinks; they can make you dizzier. What side effects may I notice from receiving this medicine? Side effects that you should report to your doctor or health care professional as soon as possible:  allergic reactions like skin rash, itching or hives; swelling of the face, lips, or tongue  fast, irregular heartbeat  signs and symptoms of low blood pressure like dizziness; feeling faint or lightheaded, falls; unusually weak   or tired  swelling of ankles, feet, hands Side effects that usually do not require medical attention (report these to your doctor or health care professional if they continue or are bothersome):  dry mouth  facial flushing  headache  stomach pain  tiredness This list may not describe all possible side effects. Call your  doctor for medical advice about side effects. You may report side effects to FDA at 1-800-FDA-1088. Where should I keep my medicine? Keep out of the reach of children. Store at room temperature between 59 and 86 degrees F (15 and 30 degrees C). Throw away any unused medicine after the expiration date. NOTE: This sheet is a summary. It may not cover all possible information. If you have questions about this medicine, talk to your doctor, pharmacist, or health care provider.  2020 Elsevier/Gold Standard (2018-07-09 15:07:10)  

## 2019-11-22 ENCOUNTER — Telehealth: Payer: Self-pay

## 2019-11-22 ENCOUNTER — Encounter: Payer: Self-pay | Admitting: Physician Assistant

## 2019-11-22 ENCOUNTER — Other Ambulatory Visit: Payer: Self-pay | Admitting: Physician Assistant

## 2019-11-22 DIAGNOSIS — E7800 Pure hypercholesterolemia, unspecified: Secondary | ICD-10-CM

## 2019-11-22 LAB — TSH: TSH: 1.22 u[IU]/mL (ref 0.450–4.500)

## 2019-11-22 LAB — LIPID PANEL
Chol/HDL Ratio: 3.4 ratio (ref 0.0–4.4)
Cholesterol, Total: 238 mg/dL — ABNORMAL HIGH (ref 100–199)
HDL: 69 mg/dL (ref >39)
LDL Chol Calc (NIH): 147 mg/dL — ABNORMAL HIGH (ref 0–99)
Triglycerides: 123 mg/dL (ref 0–149)
VLDL Cholesterol Cal: 22 mg/dL (ref 5–40)

## 2019-11-22 MED ORDER — ATORVASTATIN CALCIUM 20 MG PO TABS: 20.0000 mg | ORAL_TABLET | Freq: Every day | ORAL | 3 refills | Status: DC

## 2019-11-22 NOTE — Telephone Encounter (Signed)
Sent in Atorvastatin to [facility]

## 2019-11-22 NOTE — Telephone Encounter (Signed)
From [facility] 

## 2019-11-22 NOTE — Telephone Encounter (Signed)
LMOVM for pt to return call. Need to know if pt wants rx sent to [facility]?

## 2019-11-22 NOTE — Telephone Encounter (Signed)
Per Angela Hodges, she is wait for further refills until she comes back for follow up. Also Angela Hodges wants to hold off on sending valsartan 80 mg refill until after follow up. Angela Hodges may make changes to both medications.

## 2019-11-22 NOTE — Telephone Encounter (Signed)
Pt advised.  She agreed to start a cholesterol medicine.  Please send to [facility].   Thanks,   -Mickel Baas

## 2019-11-22 NOTE — Telephone Encounter (Signed)
Rx for amLODipine (NORVASC) 5 MG tablet was sent to local [facility] yesterday 11/21/2019. Today [facility] is requesting a new Rx for amLODipine (NORVASC) 5 MG tablet for 90 day supply. Please advise. Thanks TNP

## 2019-11-22 NOTE — Telephone Encounter (Signed)
-----   Message from Mar Daring, [place] sent at 11/22/2019 12:45 PM EST ----- Cholesterol is elevated. Currently you are at a 9.7% risk to have a cardiovascular event, including stroke, over the next 10 years. This is an elevated risk. If we optimize your cholesterol with a cholesterol lowering medication, statin, as we discussed in the office and use that in conjunction with controlling your hypertension and healthy lifestyle modifications we can reduce that risk to 3.3%. If agreeable I will send in medication for you. Thyroid is normal and stable.

## 2019-11-22 NOTE — Telephone Encounter (Signed)
Pt states that a 30 day was supposed to be sent to local pharmacy since pt just started medication, but insurance requires that she get 90 day supply on all maintance medication and because this will be shipped they need RX to go ahead and send out.  Pt also states that [facility] has told her that valsartan (DIOVAN) 80 MG tablet was on hold and she wants to know if the office can get that corrected for her as well.  Pt can be reached at [phone]

## 2019-11-23 ENCOUNTER — Telehealth: Payer: Self-pay | Admitting: Physician Assistant

## 2019-11-23 NOTE — Telephone Encounter (Signed)
It looks like her Amlodipine is new to her 11/21/2019.  Do you want this to go to mail order yet?  Thanks,   -Mickel Baas

## 2019-11-23 NOTE — Telephone Encounter (Signed)
Please see mychart message.

## 2019-11-23 NOTE — Telephone Encounter (Signed)
[facility] faxed refill request for the following medications:  amLODipine (NORVASC) 5 MG tablet  90 day supply Last Rx: 11/21/2019 30 day supply no refills LOV: 11/21/2019 NOV: 12/09/2019 Please advise. Thanks TNP

## 2019-11-23 NOTE — Telephone Encounter (Signed)
No not yet. Thanks

## 2019-11-26 ENCOUNTER — Encounter: Payer: Self-pay | Admitting: Physician Assistant

## 2019-11-28 ENCOUNTER — Telehealth (INDEPENDENT_AMBULATORY_CARE_PROVIDER_SITE_OTHER): Payer: Managed Care, Other (non HMO) | Admitting: Physician Assistant

## 2019-11-28 ENCOUNTER — Encounter: Payer: Self-pay | Admitting: Physician Assistant

## 2019-11-28 VITALS — BP 148/88 | HR 87

## 2019-11-28 DIAGNOSIS — I10 Essential (primary) hypertension: Secondary | ICD-10-CM

## 2019-11-28 MED ORDER — VALSARTAN-HYDROCHLOROTHIAZIDE 320-25 MG PO TABS: 1.0000 | ORAL_TABLET | Freq: Every day | ORAL | 0 refills | Status: DC

## 2019-11-28 NOTE — Patient Instructions (Signed)
Hydrochlorothiazide, HCTZ; Valsartan tablets What is this medicine? VALSARTAN;HYDROCHLOROTHIAZIDE (val SAR tan; hye droe klor oh THYE a zide) is a combination of a drug that relaxes blood vessels and a diuretic. It is used to treat high blood pressure. This medicine may be used for other purposes; ask your health care provider or pharmacist if you have questions. COMMON BRAND NAME(S): Diovan HCT What should I tell my health care provider before I take this medicine? They need to know if you have any of these conditions:  decreased urine  diabetes  if you are on a special diet, like a low salt diet  immune system problems, like lupus  kidney disease  liver disease  an unusual or allergic reaction to valsartan, hydrochlorothiazide, sulfa drugs, other medicines, foods, dyes, or preservatives  pregnant or trying to get pregnant  breast-feeding How should I use this medicine? Take this medicine by mouth with a glass of water. Follow the directions on the prescription label. You can take it with or without food. If it upsets your stomach, take it with food. Take your medicine at regular intervals. Do not take it more often than directed. Do not stop taking except on your doctor's advice. Talk to your pediatrician regarding the use of this medicine in children. Special care may be needed. Overdosage: If you think you have taken too much of this medicine contact a poison control center or emergency room at once. NOTE: This medicine is only for you. Do not share this medicine with others. What if I miss a dose? If you miss a dose, take it as soon as you can. If it is almost time for your next dose, take only that dose. Do not take double or extra doses. What may interact with this medicine?  barbiturates like phenobarbital  corticosteroids like prednisone  diuretics, especially triamterene, spironolactone or amiloride  lithium  medicines for diabetes  NSAIDs, medicines for pain and  inflammation, like ibuprofen or naproxen  potassium salts or potassium supplements  prescription pain medicines  skeletal muscle relaxants like tubocurarine  some cholesterol lowering medicines like cholestyramine or colestipol This list may not describe all possible interactions. Give your health care provider a list of all the medicines, herbs, non-prescription drugs, or dietary supplements you use. Also tell them if you smoke, drink alcohol, or use illegal drugs. Some items may interact with your medicine. What should I watch for while using this medicine? You must visit your health care professional for regular checks on your progress. Check your blood pressure regularly while you are taking this medicine. Ask your doctor or health care professional what your blood pressure should be and when you should contact him or her. When you check your blood pressure, write down the measurements to show your doctor or health care professional. You must not get dehydrated. Ask your doctor or health care professional how much fluid you need to drink a day. Check with him or her if you get an attack of severe diarrhea, nausea and vomiting, or if you sweat a lot. The loss of too much body fluid can make it dangerous for you to take this medicine. Women should inform their doctor if they wish to become pregnant or think they might be pregnant. There is a potential for serious side effects to an unborn child, particularly in the second or third trimester. Talk to your health care professional or pharmacist for more information. You may get drowsy or dizzy. Do not drive, use machinery, or do  anything that needs mental alertness until you know how this drug affects you. Do not stand or sit up quickly, especially if you are an older patient. This reduces the risk of dizzy or fainting spells. Alcohol can make you more drowsy and dizzy. Avoid alcoholic drinks. This medicine may increase blood sugar. Ask your  healthcare provider if changes in diet or medicines are needed if you have diabetes. Avoid salt substitutes unless you are told otherwise by your doctor or health care professional. This medicine can make you more sensitive to the sun. Keep out of the sun. If you cannot avoid being in the sun, wear protective clothing and use sunscreen. Do not use sun lamps or tanning beds/booths. Do not treat yourself for coughs, colds, or pain while you are taking this medicine without asking your doctor or health care professional for advice. Some ingredients may increase your blood pressure. What side effects may I notice from receiving this medicine? Side effects that you should report to your doctor or health care professional as soon as possible:  changes in vision  confusion, dizziness, light headedness or fainting spells  decreased amount of urine passed  difficulty breathing or swallowing, hoarseness, or tightening of the throat  eye pain  fast or irregular heart beat, palpitations, or chest pain  muscle cramps  nausea and vomiting  redness, blistering, peeling or loosening of the skin, including inside the mouth   signs and symptoms of high blood sugar such as being more thirsty or hungry or having to urinate more than normal. You may also feel very tired or have blurry vision.  stomach pain  swelling of your face, lips, tongue, hands, or feet  unusual rash, bleeding or bruising, or pinpoint red spots on the skin  worsened gout pain  yellowing of the eyes or skin Side effects that usually do not require medical attention (report to your doctor or health care professional if they continue or are bothersome):  change in sex drive or performance  cough  headache This list may not describe all possible side effects. Call your doctor for medical advice about side effects. You may report side effects to FDA at 1-800-FDA-1088. Where should I keep my medicine? Keep out of the reach of  children. Store at room temperature between 15 and 30 degrees C (59 and 86 degrees F). Protect from moisture. Keep container tightly closed. Throw away any unused medicine after the expiration date. NOTE: This sheet is a summary. It may not cover all possible information. If you have questions about this medicine, talk to your doctor, pharmacist, or health care provider.  2020 Elsevier/Gold Standard (2018-10-06 10:22:55)

## 2019-11-28 NOTE — Progress Notes (Signed)
Patient: Angela Hodges Female    DOB: 12-23-1956   63 y.o.   MRN: [id number] Visit Date: 11/28/2019  Today's Provider: Margaretann Loveless, PA-C   No chief complaint on file.  Subjective:    I,Joseline E. Rosas,RMA am acting as a Neurosurgeon for [employer], PA-C.  Virtual Visit via Video Note  I connected with Angela Hodges on 11/28/19 at  3:20 PM EST by a video enabled telemedicine application and verified that I am speaking with the correct person using two identifiers.  Location: Patient: Home Provider: [facility]   I discussed the limitations of evaluation and management by telemedicine and the availability of in person appointments. The patient expressed understanding and agreed to proceed.  HPI  Patient with c/o possible side effect from Amlodipine. She reports that on Wednesday she was feeling tired, and exhausted, lightheaded, light diarrhea, toes cold, fingers cold. She reports that she just felt terrible. Took amlodipine from last Tuesday until yesterday. She reports that she has not taken the Amlodipine today and feels a little better but still tired. No fever.   Allergies  Allergen Reactions  . Imitrex [Sumatriptan] Other (See Comments)    Could not speak, worsened headache  . Penicillins Hives  . Ranitidine Hcl Hives  . Sulfa Antibiotics Hives  . Tramadol Hcl Hives     Current Outpatient Medications:  .  amLODipine (NORVASC) 5 MG tablet, Take 1 tablet (5 mg total) by mouth daily., Disp: 30 tablet, Rfl: 0 .  aspirin 81 MG tablet, Take 1 tablet by mouth daily., Disp: , Rfl:  .  atorvastatin (LIPITOR) 20 MG tablet, Take 1 tablet (20 mg total) by mouth daily at 6 PM., Disp: 90 tablet, Rfl: 3 .  Cholecalciferol 1000 UNITS capsule, Take 1 capsule by mouth 3 (three) times daily., Disp: , Rfl:  .  meloxicam (MOBIC) 15 MG tablet, Take 1 tablet (15 mg total) by mouth daily., Disp: 30 tablet, Rfl: 0 .  methocarbamol (ROBAXIN) 500 MG tablet, Take 500 mg by  mouth 2 (two) times daily., Disp: , Rfl:  .  Omega-3 Fatty Acids (FISH OIL) 1200 MG CAPS, Take by mouth., Disp: , Rfl:  .  ondansetron (ZOFRAN) 4 MG tablet, Take 4 mg by mouth every 8 (eight) hours as needed., Disp: , Rfl:  .  topiramate (TOPAMAX) 25 MG tablet, SMARTSIG:3 Tablet(s) By Mouth Every Other Day PRN, Disp: , Rfl:  .  valACYclovir (VALTREX) 1000 MG tablet, Take 1 tablet (1,000 mg total) by mouth daily., Disp: 90 tablet, Rfl: 0 .  valsartan (DIOVAN) 80 MG tablet, Take 1 tablet (80 mg total) by mouth daily., Disp: 90 tablet, Rfl: 3 .  valsartan-hydrochlorothiazide (DIOVAN-HCT) 160-25 MG tablet, Take 1 tablet by mouth daily., Disp: 30 tablet, Rfl: 0 .  zolpidem (AMBIEN) 10 MG tablet, TAKE 1 TABLET BY MOUTH AT  BEDTIME AS NEEDED FOR SLEEP, Disp: 90 tablet, Rfl: 1 .  acetaminophen-codeine (TYLENOL #3) 300-30 MG tablet, Take 1 tablet by mouth every 6 (six) hours., Disp: , Rfl:  .  DULoxetine (CYMBALTA) 30 MG capsule, Take 1 capsule (30 mg total) by mouth daily. (Patient not taking: Reported on 11/21/2019), Disp: 30 capsule, Rfl: 0  Review of Systems  Constitutional: Positive for chills.  Respiratory: Negative for chest tightness, shortness of breath and wheezing.   Cardiovascular: Negative for chest pain.  Gastrointestinal: Positive for diarrhea.  Endocrine: Positive for cold intolerance.  Neurological: Positive for light-headedness. Negative for headaches.  Social History   Tobacco Use  . Smoking status: Never Smoker  . Smokeless tobacco: Never Used  Substance Use Topics  . Alcohol use: No      Objective:   BP (!) 148/88 (BP Location: Right Wrist, Patient Position: Sitting, Cuff Size: Large)   Pulse 87  Vitals:   11/28/19 1353  BP: (!) 148/88  Pulse: 87  There is no height or weight on file to calculate BMI.   Physical Exam Constitutional:      General: She is not in acute distress. Pulmonary:     Effort: No respiratory distress.  Neurological:     Mental Status:  She is alert.      No results found for any visits on 11/28/19.     Assessment & Plan     1. Essential hypertension Stop Amlodipine. Will increase valsartan to 320mg  as below. Patient has been taking Valsartan 240mg  (160mg  +80mg ) without issue. Continue HCTZ at 25mg  as below. I will see her back in the coming 2 weeks. Call if symptoms do not resolve or if things change prior to next appointment. - valsartan-hydrochlorothiazide (DIOVAN-HCT) 320-25 MG tablet; Take 1 tablet by mouth daily.  Dispense: 90 tablet; Refill: 0   I discussed the assessment and treatment plan with the patient. The patient was provided an opportunity to ask questions and all were answered. The patient agreed with the plan and demonstrated an understanding of the instructions.   The patient was advised to call back or seek an in-person evaluation if the symptoms worsen or if the condition fails to improve as anticipated.  I provided 12 minutes of non-face-to-face time during this encounter.    Margaretann Loveless, PA-C  [facility]

## 2019-11-28 NOTE — Telephone Encounter (Signed)
Patient has appt today.

## 2019-11-29 ENCOUNTER — Ambulatory Visit: Payer: Self-pay | Admitting: Physician Assistant

## 2019-12-05 ENCOUNTER — Encounter: Payer: Self-pay | Admitting: Physician Assistant

## 2019-12-05 ENCOUNTER — Ambulatory Visit: Payer: Self-pay | Admitting: *Deleted

## 2019-12-05 NOTE — Telephone Encounter (Signed)
From [facility] 

## 2019-12-05 NOTE — Telephone Encounter (Signed)
She called in c/o having palpitations that started last week when she was started on amlodipine.   The weakness is better.  She is also having a humming in both ears that started Saturday.  Denies any dizziness, shortness of breath, chest tightness/pain.  She has an appt on 12/06/2019 at 8:00 AM.    I went over the care advice and to call 911 if she had any of the s/s.   She verbalized understanding and was agreeable to this plan.  I sent this note to [facility] office.  Reason for Disposition . [1] Palpitations AND [2] no improvement after using CARE ADVICE    Started having palpitations when amlodipine was started last week.  Answer Assessment - Initial Assessment Questions 1. DESCRIPTION: "Please describe your heart rate or heart beat that you are having" (e.g., fast/slow, regular/irregular, skipped or extra beats, "palpitations")     I've been having palpitations for over a week.   I was started on Amlodipine last week. They started after I started with the amlodipine.   The weakness is better.      Saturday I feel like there is a low humming in both of my ears.   2. ONSET: "When did it start?" (Minutes, hours or days)      Been having the palpitations since starting the amlodipine last week. 3. DURATION: "How long does it last" (e.g., seconds, minutes, hours)     With activity. 4. PATTERN "Does it come and go, or has it been constant since it started?"  "Does it get worse with exertion?"   "Are you feeling it now?"     They come and go.    Any activity brings them own.   If I'm still I don't have the palpitations. 5. TAP: "Using your hand, can you tap out what you are feeling on a chair or table in front of you, so that I can hear?" (Note: not all patients can do this)       Not asked 6. HEART RATE: "Can you tell me your heart rate?" "How many beats in 15 seconds?"  (Note: not all patients can do this)         Pulse 90  149/88 7. RECURRENT SYMPTOM: "Have you ever had this  before?" If so, ask: "When was the last time?" and "What happened that time?"      No 8. CAUSE: "What do you think is causing the palpitations?"     They started when I started the amlodipine last week. 9. CARDIAC HISTORY: "Do you have any history of heart disease?" (e.g., heart attack, angina, bypass surgery, angioplasty, arrhythmia)      No 10. OTHER SYMPTOMS: "Do you have any other symptoms?" (e.g., dizziness, chest pain, sweating, difficulty breathing)       She doubled my BP pill and my BP is still the same.   11. PREGNANCY: "Is there any chance you are pregnant?" "When was your last menstrual period?"       N/A  Protocols used: [place]

## 2019-12-06 ENCOUNTER — Encounter: Payer: Self-pay | Admitting: Family Medicine

## 2019-12-06 ENCOUNTER — Ambulatory Visit (INDEPENDENT_AMBULATORY_CARE_PROVIDER_SITE_OTHER): Payer: Managed Care, Other (non HMO) | Admitting: Family Medicine

## 2019-12-06 ENCOUNTER — Other Ambulatory Visit: Payer: Self-pay

## 2019-12-06 VITALS — BP 122/76 | HR 95 | Temp 96.2°F | Wt 248.0 lb

## 2019-12-06 DIAGNOSIS — G43009 Migraine without aura, not intractable, without status migrainosus: Secondary | ICD-10-CM

## 2019-12-06 DIAGNOSIS — R002 Palpitations: Secondary | ICD-10-CM | POA: Diagnosis not present

## 2019-12-06 DIAGNOSIS — I10 Essential (primary) hypertension: Secondary | ICD-10-CM | POA: Diagnosis not present

## 2019-12-06 DIAGNOSIS — H9319 Tinnitus, unspecified ear: Secondary | ICD-10-CM | POA: Diagnosis not present
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
## 2019-12-09 ENCOUNTER — Other Ambulatory Visit: Payer: Self-pay

## 2019-12-09 ENCOUNTER — Ambulatory Visit (INDEPENDENT_AMBULATORY_CARE_PROVIDER_SITE_OTHER): Payer: Managed Care, Other (non HMO) | Admitting: Physician Assistant

## 2019-12-09 ENCOUNTER — Encounter: Payer: Self-pay | Admitting: Physician Assistant

## 2019-12-09 VITALS — BP 130/80 | HR 78 | Temp 96.8°F | Resp 16 | Wt 250.0 lb

## 2019-12-09 DIAGNOSIS — Z0000 Encounter for general adult medical examination without abnormal findings: Secondary | ICD-10-CM

## 2019-12-09 DIAGNOSIS — H6983 Other specified disorders of Eustachian tube, bilateral: Secondary | ICD-10-CM

## 2019-12-09 DIAGNOSIS — H6121 Impacted cerumen, right ear: Secondary | ICD-10-CM

## 2019-12-09 DIAGNOSIS — Z124 Encounter for screening for malignant neoplasm of cervix: Secondary | ICD-10-CM | POA: Diagnosis not present

## 2019-12-09 DIAGNOSIS — Z1231 Encounter for screening mammogram for malignant neoplasm of breast: Secondary | ICD-10-CM | POA: Diagnosis not present

## 2019-12-09 MED ORDER — FLUTICASONE PROPIONATE 50 MCG/ACT NA SUSP: 2.0000 | Freq: Every day | NASAL | 6 refills | Status: DC

## 2019-12-09 NOTE — Patient Instructions (Signed)
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
## 2019-12-16 ENCOUNTER — Encounter: Payer: Self-pay | Admitting: Physician Assistant

## 2019-12-16 DIAGNOSIS — R002 Palpitations: Secondary | ICD-10-CM

## 2019-12-20 ENCOUNTER — Telehealth: Payer: Self-pay

## 2019-12-20 LAB — PAP IG AND HPV HIGH-RISK: PAP Smear Comment: 0

## 2019-12-20 LAB — HPV, LOW VOLUME (REFLEX): HPV low volume reflex: NEGATIVE

## 2019-12-20 NOTE — Telephone Encounter (Signed)
Pt advised.   Thanks,   -Angela Hodges  

## 2019-12-20 NOTE — Telephone Encounter (Signed)
-----   Message from Mar Daring, [place] sent at 12/20/2019  1:02 PM EST ----- Pap is normal, HPV negative.  Will repeat at 63 years old if desired.

## 2019-12-21 ENCOUNTER — Other Ambulatory Visit: Payer: Self-pay | Admitting: Physician Assistant

## 2019-12-21 DIAGNOSIS — I10 Essential (primary) hypertension: Secondary | ICD-10-CM

## 2019-12-26 ENCOUNTER — Other Ambulatory Visit: Payer: Self-pay | Admitting: Physician Assistant

## 2019-12-26 DIAGNOSIS — I10 Essential (primary) hypertension: Secondary | ICD-10-CM

## 2019-12-26 MED ORDER — VALSARTAN-HYDROCHLOROTHIAZIDE 320-25 MG PO TABS: 1.0000 | ORAL_TABLET | Freq: Every day | ORAL | 3 refills | Status: DC

## 2020-01-02 ENCOUNTER — Other Ambulatory Visit: Payer: Self-pay | Admitting: Physician Assistant

## 2020-01-02 DIAGNOSIS — I10 Essential (primary) hypertension: Secondary | ICD-10-CM

## 2020-01-02 DIAGNOSIS — G43009 Migraine without aura, not intractable, without status migrainosus: Secondary | ICD-10-CM

## 2020-01-02 DIAGNOSIS — M5481 Occipital neuralgia: Secondary | ICD-10-CM

## 2020-01-02 MED ORDER — VALSARTAN-HYDROCHLOROTHIAZIDE 320-25 MG PO TABS: 1.0000 | ORAL_TABLET | Freq: Every day | ORAL | 1 refills | Status: DC

## 2020-01-02 NOTE — Telephone Encounter (Signed)
[facility] faxed refill request for the following medications:  valsartan-hydrochlorothiazide (DIOVAN-HCT) 320-25 MG tablet    Please advise.

## 2020-01-06 DIAGNOSIS — R002 Palpitations: Secondary | ICD-10-CM | POA: Insufficient documentation

## 2020-01-06 DIAGNOSIS — R079 Chest pain, unspecified: Secondary | ICD-10-CM | POA: Insufficient documentation

## 2020-01-09 ENCOUNTER — Ambulatory Visit: Payer: Managed Care, Other (non HMO) | Admitting: Cardiovascular Disease

## 2020-01-10 ENCOUNTER — Encounter: Payer: Self-pay | Admitting: Cardiovascular Disease

## 2020-01-20 ENCOUNTER — Other Ambulatory Visit: Payer: Self-pay | Admitting: Physician Assistant

## 2020-01-20 DIAGNOSIS — G4700 Insomnia, unspecified: Secondary | ICD-10-CM

## 2020-01-21 NOTE — Telephone Encounter (Signed)
Requested medication (s) are due for refill today: yes  Requested medication (s) are on the active medication list: yes  Last refill:  12/12/2019  Future visit scheduled: yes  Notes to clinic:  This refill cannot be delegated   Requested Prescriptions  Pending Prescriptions Disp Refills   zolpidem (AMBIEN) 10 MG tablet [Pharmacy Med Name: ZOLPIDEM  10MG   TAB] 90 tablet     Sig: TAKE 1 TABLET BY MOUTH AT  BEDTIME AS NEEDED FOR SLEEP      Not Delegated - Psychiatry:  Anxiolytics/Hypnotics Failed - 01/20/2020  5:29 PM      Failed - This refill cannot be delegated      Failed - Urine Drug Screen completed in last 360 days.      Passed - Valid encounter within last 6 months    Recent Outpatient Visits           1 month ago Annual physical exam   [facility] [place], [place], Alessandra Bevels   1 month ago Tinnitus, unspecified laterality   [place], [employer], [place] [facility]   1 month ago Essential hypertension   [facility], [place], PA-C   2 months ago Essential (primary) hypertension   [facility] Bevels, [place], PA-C   2 months ago Depression, unspecified depression type   [place], [employer], FNP       Future Appointments             In 1 month [place], Eula Fried, PA-C Alessandra Bevels, [facility]

## 2020-01-26 ENCOUNTER — Ambulatory Visit
Admission: EM | Admit: 2020-01-26 | Discharge: 2020-01-26 | Disposition: A | Discharge status: Home / Self Care | Payer: Managed Care, Other (non HMO) | Attending: Urgent Care | Admitting: Urgent Care

## 2020-01-26 ENCOUNTER — Other Ambulatory Visit: Payer: Self-pay

## 2020-01-26 ENCOUNTER — Encounter: Payer: Self-pay | Admitting: Emergency Medicine

## 2020-01-26 DIAGNOSIS — N390 Urinary tract infection, site not specified: Secondary | ICD-10-CM | POA: Insufficient documentation

## 2020-01-26 LAB — URINALYSIS, COMPLETE (UACMP) WITH MICROSCOPIC
Bilirubin Urine: NEGATIVE
Glucose, UA: NEGATIVE mg/dL
Hgb urine dipstick: NEGATIVE
Ketones, ur: NEGATIVE mg/dL
Leukocytes,Ua: NEGATIVE
Nitrite: POSITIVE — AB
Protein, ur: NEGATIVE mg/dL
Specific Gravity, Urine: 1.015 (ref 1.005–1.030)
pH: 5 (ref 5.0–8.0)

## 2020-01-26 MED ORDER — NITROFURANTOIN MONOHYD MACRO 100 MG PO CAPS: 100.0000 mg | ORAL_CAPSULE | Freq: Two times a day (BID) | ORAL | 0 refills | Status: DC

## 2020-01-26 NOTE — ED Triage Notes (Signed)
Patient in today c/o 1 week history of flank pain and urinary urgency. Patient denies fever. Patient has tried OTC Azo without relief.

## 2020-01-26 NOTE — Discharge Instructions (Addendum)
It was very nice seeing you today in clinic. Thank you for entrusting me with your care.   As discussed, your urine is POSITIVE for infection. Will approach treatment as follows:  Prescription has been sent to your pharmacy for antibiotics.  Please pick up and take as directed. FINISH the entire course of medication even if you are feeling better.  A culture will be sent on your provided sample. If it comes back resistant to what I have prescribed you, someone will call you and let you know that we will need to change antibiotics. Increase fluid intake as much as possible to flush your urinary tract.  Water is always the best.  Avoid caffeine until your infection clears up, as it can contribute to painful bladder spasms.  May use Tylenol and/or Ibuprofen as needed for pain/fever. May use Azo products (over the counter) to help with pain/discomfort.   Make arrangements to follow up with your regular doctor in 1 week for re-evaluation. If your symptoms/condition worsens, please seek follow up care either here or in the ER. Please remember, our [place] providers are "right here with you" when you need us.   Again, it was my pleasure to take care of you today. Thank you for choosing our clinic. I hope that you start to feel better quickly.   Tiersa Dayley, MSN, APRN, FNP-C, CEN Advanced Practice Provider [facility] 

## 2020-01-27 ENCOUNTER — Ambulatory Visit (INDEPENDENT_AMBULATORY_CARE_PROVIDER_SITE_OTHER): Payer: Managed Care, Other (non HMO) | Admitting: Family Medicine

## 2020-01-27 ENCOUNTER — Encounter: Payer: Self-pay | Admitting: Family Medicine

## 2020-01-27 VITALS — BP 132/64 | HR 61 | Temp 95.9°F | Wt 239.0 lb

## 2020-01-27 DIAGNOSIS — I10 Essential (primary) hypertension: Secondary | ICD-10-CM | POA: Diagnosis not present

## 2020-01-27 DIAGNOSIS — G4700 Insomnia, unspecified: Secondary | ICD-10-CM

## 2020-01-27 LAB — URINE CULTURE

## 2020-01-27 MED ORDER — TRAZODONE HCL 100 MG PO TABS: 50.0000 mg | ORAL_TABLET | Freq: Every day | ORAL | 3 refills | Status: DC

## 2020-01-27 MED ORDER — ATENOLOL 25 MG PO TABS: 25.0000 mg | ORAL_TABLET | Freq: Every day | ORAL | 3 refills | Status: DC

## 2020-01-27 MED ORDER — OMEPRAZOLE 20 MG PO CPDR: 20.0000 mg | DELAYED_RELEASE_CAPSULE | Freq: Every day | ORAL | 3 refills | Status: DC

## 2020-01-27 NOTE — Assessment & Plan Note (Signed)
Congratulated her on her recent weight loss Discussed importance of healthy weight management Discussed diet and exercise

## 2020-01-27 NOTE — Assessment & Plan Note (Signed)
Patient has been taking Ambien nightly for many years. She states that she cannot sleep without taking something at this point She tried something that gave her dry mouth prior to Ambien, but nothing else We discussed that this may have been Unisom or doxylamine She would like to get off of Ambien We discussed the risks of long-term Ambien use including some theoretical higher risk of developing dementia in the future We discussed that people often have difficulty sleeping after stopping Ambien We will try switching to trazodone, however Follow-up at next visit
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
## 2020-01-27 NOTE — Progress Notes (Signed)
Patient: Angela Hodges Female    DOB: April 06, 1956   64 y.o.   MRN: [id number] Visit Date: 01/27/2020  Today's Provider: Shirlee Latch, MD   Chief Complaint  Patient presents with  . Hypertension   Subjective:     HPI    Hypertension, follow-up:  BP Readings from Last 3 Encounters:  01/27/20 132/64  01/26/20 (!) 167/86  12/09/19 130/80    She was last seen for hypertension 1 months ago.  Management since that visit includes no changes. At [facility] last night for UTI BP was elevated and he started atenolol.  This worried her about whether she had a heart problem after reading that this was for post-MI care.  She denies any side effects. She was seen by Cardiology this morning for palpitations and was given reassurance.  Felt to be due to GERD. She reports excellent compliance with treatment. She is not having side effects.  She is exercising.  Walking more over the last 2 months She is adherent to low salt diet.  Has lost weight since adjusting diet Outside blood pressures are running high on her BP cuff at home. (140s/80s) She is experiencing none.  Patient denies chest pain, chest pressure/discomfort, fatigue and palpitations.   Cardiovascular risk factors include hypertension and obesity (BMI >= 30 kg/m2).  Use of agents associated with hypertension: none.     Weight trend: decreasing steadily Wt Readings from Last 3 Encounters:  01/27/20 239 lb (108.4 kg)  01/26/20 240 lb (108.9 kg)  12/09/19 250 lb (113.4 kg)    Current diet: in general, a "healthy" diet    ------------------------------------------------------------------------    Allergies  Allergen Reactions  . Imitrex [Sumatriptan] Other (See Comments)    Could not speak, worsened headache  . Penicillins Hives  . Ranitidine Hcl Hives  . Sulfa Antibiotics Hives  . Tramadol Hcl Hives  . Amlodipine Palpitations  . Topiramate Palpitations     Current Outpatient Medications:  .  aspirin 81 MG  tablet, Take 1 tablet by mouth daily., Disp: , Rfl:  .  atenolol (TENORMIN) 25 MG tablet, Take 1 tablet (25 mg total) by mouth daily., Disp: 30 tablet, Rfl: 3 .  atorvastatin (LIPITOR) 20 MG tablet, Take 1 tablet (20 mg total) by mouth daily at 6 PM., Disp: 90 tablet, Rfl: 3 .  cetirizine (ZYRTEC) 10 MG tablet, Take 10 mg by mouth daily., Disp: , Rfl:  .  Cholecalciferol 1000 UNITS capsule, Take 1 capsule by mouth 3 (three) times daily., Disp: , Rfl:  .  fluticasone (FLONASE) 50 MCG/ACT nasal spray, Place 2 sprays into both nostrils daily., Disp: 16 g, Rfl: 6 .  meloxicam (MOBIC) 15 MG tablet, Take 1 tablet (15 mg total) by mouth daily., Disp: 30 tablet, Rfl: 0 .  methocarbamol (ROBAXIN) 500 MG tablet, Take 500 mg by mouth 2 (two) times daily., Disp: , Rfl:  .  nitrofurantoin, macrocrystal-monohydrate, (MACROBID) 100 MG capsule, Take 1 capsule (100 mg total) by mouth 2 (two) times daily., Disp: 10 capsule, Rfl: 0 .  Omega-3 Fatty Acids (FISH OIL) 1200 MG CAPS, Take by mouth., Disp: , Rfl:  .  omeprazole (PRILOSEC) 20 MG capsule, Take 1 capsule (20 mg total) by mouth daily., Disp: 30 capsule, Rfl: 3 .  valACYclovir (VALTREX) 1000 MG tablet, Take 1 tablet (1,000 mg total) by mouth daily., Disp: 90 tablet, Rfl: 0 .  valsartan-hydrochlorothiazide (DIOVAN-HCT) 320-25 MG tablet, Take 1 tablet by mouth daily., Disp: 90 tablet, Rfl: 1 .  traZODone (DESYREL) 100 MG tablet, Take 0.5-1 tablets (50-100 mg total) by mouth at bedtime., Disp: 30 tablet, Rfl: 3  Review of Systems  Constitutional: Negative.   Respiratory: Negative.   Cardiovascular: Negative.   Gastrointestinal: Negative.   Neurological: Negative for dizziness, light-headedness and headaches.    Social History   Tobacco Use  . Smoking status: Never Smoker  . Smokeless tobacco: Never Used  Substance Use Topics  . Alcohol use: No      Objective:   BP 132/64 (BP Location: Right Arm, Patient Position: Sitting, Cuff Size: Large)   Pulse  61   Temp (!) 95.9 F (35.5 C) (Temporal)   Wt 239 lb (108.4 kg)   SpO2 97%   BMI 39.77 kg/m  Vitals:   01/27/20 1330  BP: 132/64  Pulse: 61  Temp: (!) 95.9 F (35.5 C)  TempSrc: Temporal  SpO2: 97%  Weight: 239 lb (108.4 kg)  Body mass index is 39.77 kg/m.   Physical Exam Vitals reviewed.  Constitutional:      General: She is not in acute distress.    Appearance: Normal appearance. She is well-developed. She is not diaphoretic.  HENT:     Head: Normocephalic and atraumatic.  Eyes:     General: No scleral icterus.    Conjunctiva/sclera: Conjunctivae normal.  Neck:     Thyroid: No thyromegaly.  Cardiovascular:     Rate and Rhythm: Normal rate and regular rhythm.     Pulses: Normal pulses.     Heart sounds: Normal heart sounds. No murmur.  Pulmonary:     Effort: Pulmonary effort is normal. No respiratory distress.     Breath sounds: Normal breath sounds. No wheezing, rhonchi or rales.  Musculoskeletal:     Cervical back: Neck supple.     Right lower leg: No edema.     Left lower leg: No edema.  Lymphadenopathy:     Cervical: No cervical adenopathy.  Skin:    General: Skin is warm and dry.     Capillary Refill: Capillary refill takes less than 2 seconds.     Findings: No rash.  Neurological:     Mental Status: She is alert and oriented to person, place, and time. Mental status is at baseline.  Psychiatric:        Mood and Affect: Mood normal.        Behavior: Behavior normal.      No results found for any visits on 01/27/20.     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 She states that she cannot sleep without taking something at this point She tried something that gave her dry mouth prior to Ambien, but nothing else We discussed that this may have been Unisom or doxylamine She would like to get off of Ambien We discussed  the risks of long-term Ambien use including some theoretical higher risk of developing dementia in the future We discussed that people often have difficulty sleeping after stopping Ambien We will try switching to trazodone, however Follow-up at next visit      Morbid obesity due to excess calories (HCC)    Congratulated her on her recent weight loss Discussed importance of healthy weight management Discussed diet and exercise          Return in about 2 months (around 03/26/2020) for chronic disease f/u, as scheduled.   The entirety of the information documented in the History of Present Illness, Review of Systems and Physical Exam were personally obtained by me. Portions of this information were initially documented by Kavin Leech, CMA and reviewed by me for thoroughness and accuracy.    Audric Venn, Marzella Schlein, MD MPH [facility]

## 2020-01-27 NOTE — Patient Instructions (Signed)

## 2020-01-27 NOTE — Assessment & Plan Note (Signed)
Well controlled Discussed that her blood pressure may have been elevated at the urgent care related to acute illness and anxiety We will continue low-dose atenolol, but as she exercises and loses weight, we may discontinue this in the future Discussed Sprint trial and desire for lower blood pressure goal if possible Continue current medications Recheck metabolic panel F/u in 2 months

## 2020-01-30 ENCOUNTER — Other Ambulatory Visit: Payer: Self-pay | Admitting: Adult Health

## 2020-01-30 DIAGNOSIS — F419 Anxiety disorder, unspecified: Secondary | ICD-10-CM

## 2020-01-30 DIAGNOSIS — F329 Major depressive disorder, single episode, unspecified: Secondary | ICD-10-CM

## 2020-01-30 DIAGNOSIS — Z6841 Body Mass Index (BMI) 40.0 and over, adult: Secondary | ICD-10-CM

## 2020-01-30 DIAGNOSIS — I10 Essential (primary) hypertension: Secondary | ICD-10-CM

## 2020-01-30 DIAGNOSIS — F32A Depression, unspecified: Secondary | ICD-10-CM

## 2020-03-09 ENCOUNTER — Ambulatory Visit: Payer: Self-pay | Admitting: Physician Assistant

## 2020-03-09 ENCOUNTER — Ambulatory Visit: Payer: Managed Care, Other (non HMO) | Admitting: Family Medicine

## 2020-03-09 ENCOUNTER — Other Ambulatory Visit: Payer: Self-pay

## 2020-03-09 VITALS — BP 140/80 | HR 66 | Temp 96.8°F | Wt 243.0 lb

## 2020-03-09 DIAGNOSIS — I10 Essential (primary) hypertension: Secondary | ICD-10-CM | POA: Diagnosis not present

## 2020-03-09 DIAGNOSIS — R7989 Other specified abnormal findings of blood chemistry: Secondary | ICD-10-CM | POA: Diagnosis not present

## 2020-03-09 DIAGNOSIS — E7800 Pure hypercholesterolemia, unspecified: Secondary | ICD-10-CM

## 2020-03-09 DIAGNOSIS — E1169 Type 2 diabetes mellitus with other specified complication: Secondary | ICD-10-CM | POA: Diagnosis not present

## 2020-03-09 MED ORDER — ATENOLOL 50 MG PO TABS: 50.0000 mg | ORAL_TABLET | Freq: Every day | ORAL | 3 refills | Status: DC

## 2020-03-09 NOTE — Progress Notes (Addendum)
Patient: Angela Hodges Female    DOB: [birth year]   64 y.o.   MRN: [id number] Visit Date: 03/12/2020  Today's Provider: Shirlee Latch, MD   Chief Complaint  Patient presents with  . Hypertension   Subjective:     HPI   Hypertension, follow-up:  BP Readings from Last 3 Encounters:  03/09/20 140/80  01/27/20 132/64  01/26/20 (!) 167/86    She was last seen for hypertension 2 months ago.  BP at that visit was 132/64. Management changes since that visit include continuing low-dose atenolol and valsartan-hydrochlorothiazide as previously prescribed. She reports excellent compliance with treatment. She is not having side effects. She is exercising. She is adherent to low salt diet.   Outside blood pressures are 140/80. She is experiencing none.   Cardiovascular risk factors include diabetes mellitus, dyslipidemia, hypertension and obesity (BMI >= 30 kg/m2).  Use of agents associated with hypertension: none.     Weight trend: stable Wt Readings from Last 3 Encounters:  03/09/20 243 lb (110.2 kg)  01/27/20 239 lb (108.4 kg)  01/26/20 240 lb (108.9 kg)    Current diet: in general, a "healthy" diet    ------------------------------------------------------------------------   Diabetes Mellitus Type II, Follow-up:   Lab Results  Component Value Date   HGBA1C 6.5 (H) 11/14/2019   HGBA1C 6.4 04/03/2017   HGBA1C 6.3 (H) 08/15/2016   HBGA1C Today is 6.1 Last seen for diabetes 4 months ago.  Management since then includes diet and exercise  . She reports excellent compliance with treatment. She is not having side effects.  Current symptoms include none and have been stable.  Episodes of hypoglycemia? no   Current insulin regiment: Is not on insulin Most Recent Eye Exam: Pt sees eye doctor yearly Weight trend: stable Prior visit with dietician: No Current exercise: walking Current diet habits: in general, a "healthy" diet    Pertinent Labs:      Component Value Date/Time   CHOL 147 03/09/2020 1205   TRIG 97 03/09/2020 1205   HDL 70 03/09/2020 1205   LDLCALC 59 03/09/2020 1205   CREATININE 0.76 03/09/2020 1205   CREATININE 0.59 (L) 07/25/2014 1142    Wt Readings from Last 3 Encounters:  03/09/20 243 lb (110.2 kg)  01/27/20 239 lb (108.4 kg)  01/26/20 240 lb (108.9 kg)    ------------------------------------------------------------------------   Allergies  Allergen Reactions  . Imitrex [Sumatriptan] Other (See Comments)    Could not speak, worsened headache  . Penicillins Hives  . Ranitidine Hcl Hives  . Sulfa Antibiotics Hives  . Tramadol Hcl Hives  . Amlodipine Palpitations  . Topiramate Palpitations     Current Outpatient Medications:  .  aspirin 81 MG tablet, Take 1 tablet by mouth daily., Disp: , Rfl:  .  atenolol (TENORMIN) 50 MG tablet, Take 1 tablet (50 mg total) by mouth daily., Disp: 90 tablet, Rfl: 3 .  atorvastatin (LIPITOR) 20 MG tablet, Take 1 tablet (20 mg total) by mouth daily at 6 PM., Disp: 90 tablet, Rfl: 3 .  cetirizine (ZYRTEC) 10 MG tablet, Take 10 mg by mouth daily., Disp: , Rfl:  .  Cholecalciferol 1000 UNITS capsule, Take 1 capsule by mouth 3 (three) times daily., Disp: , Rfl:  .  meloxicam (MOBIC) 15 MG tablet, Take 1 tablet (15 mg total) by mouth daily., Disp: 30 tablet, Rfl: 0 .  Omega-3 Fatty Acids (FISH OIL) 1200 MG CAPS, Take by mouth., Disp: , Rfl:  .  valACYclovir (  VALTREX) 1000 MG tablet, Take 1 tablet (1,000 mg total) by mouth daily., Disp: 90 tablet, Rfl: 0 .  valsartan-hydrochlorothiazide (DIOVAN-HCT) 320-25 MG tablet, Take 1 tablet by mouth daily., Disp: 90 tablet, Rfl: 1 .  fluticasone (FLONASE) 50 MCG/ACT nasal spray, Place 2 sprays into both nostrils daily., Disp: 16 g, Rfl: 6 .  methocarbamol (ROBAXIN) 500 MG tablet, Take 500 mg by mouth 2 (two) times daily., Disp: , Rfl:   Review of Systems  HENT: Negative.   Eyes: Negative.   Respiratory: Negative.   Cardiovascular:  Negative.   Gastrointestinal: Negative.   Endocrine: Negative.   Genitourinary: Negative.   Musculoskeletal: Negative.   Neurological: Negative.     Social History   Tobacco Use  . Smoking status: Never Smoker  . Smokeless tobacco: Never Used  Substance Use Topics  . Alcohol use: No      Objective:   BP 140/80 (BP Location: Left Wrist, Patient Position: Sitting, Cuff Size: Large)   Pulse 66   Temp (!) 96.8 F (36 C) (Skin)   Wt 243 lb (110.2 kg)   SpO2 98%   BMI 40.44 kg/m  Vitals:   03/09/20 1158  BP: 140/80  Pulse: 66  Temp: (!) 96.8 F (36 C)  TempSrc: Skin  SpO2: 98%  Weight: 243 lb (110.2 kg)  Body mass index is 40.44 kg/m.   Physical Exam Constitutional:      Appearance: Normal appearance.  HENT:     Head: Normocephalic and atraumatic.  Cardiovascular:     Rate and Rhythm: Normal rate and regular rhythm.     Pulses: Normal pulses.     Heart sounds: Normal heart sounds.  Pulmonary:     Effort: Pulmonary effort is normal.     Breath sounds: Normal breath sounds.  Abdominal:     General: Abdomen is flat.     Palpations: Abdomen is soft.  Musculoskeletal:     Cervical back: Neck supple.  Skin:    General: Skin is warm and dry.  Neurological:     Mental Status: She is alert.  Psychiatric:        Mood and Affect: Mood normal.        Behavior: Behavior normal.      Results for orders placed or performed in visit on 03/09/20  Lipid Panel With LDL/HDL Ratio  Result Value Ref Range   Cholesterol, Total 147 100 - 199 mg/dL   Triglycerides 97 0 - 149 mg/dL   HDL 70 >81 mg/dL   VLDL Cholesterol Cal 18 5 - 40 mg/dL   LDL Chol Calc (NIH) 59 0 - 99 mg/dL   LDL/HDL Ratio 0.8 0.0 - 3.2 ratio  Comprehensive metabolic panel  Result Value Ref Range   Glucose 91 65 - 99 mg/dL   BUN 22 8 - 27 mg/dL   Creatinine, Ser 1.91 0.57 - 1.00 mg/dL   GFR calc non Af Amer 85 >59 mL/min/1.73   GFR calc Af Amer 98 >59 mL/min/1.73   BUN/Creatinine Ratio 29 (H) 12  - 28   Sodium 142 134 - 144 mmol/L   Potassium 4.2 3.5 - 5.2 mmol/L   Chloride 102 96 - 106 mmol/L   CO2 21 20 - 29 mmol/L   Calcium 9.8 8.7 - 10.3 mg/dL   Total Protein 6.7 6.0 - 8.5 g/dL   Albumin 4.7 3.8 - 4.8 g/dL   Globulin, Total 2.0 1.5 - 4.5 g/dL   Albumin/Globulin Ratio 2.4 (H) 1.2 - 2.2  Bilirubin Total 0.4 0.0 - 1.2 mg/dL   Alkaline Phosphatase 59 39 - 117 IU/L   AST 19 0 - 40 IU/L   ALT 24 0 - 32 IU/L  Vitamin B12  Result Value Ref Range   Vitamin B-12 891 232 - 1,245 pg/mL       Assessment & Plan     Problem List Items Addressed This Visit      Cardiovascular and Mediastinum   Hypertension - Primary    Pt with HTN that is controlled with medication Recent trends show elevation in BP  Plan: Will continue on Valsartan-hydrochlorothiazide 320-25 MG once a day Increase atenolol to 50 mg once a day and monitor heart rate Discussed importance of low sodium diet and exercise for BP Recheck metabolic panel and lipid panel      Relevant Medications   atenolol (TENORMIN) 50 MG tablet   Other Relevant Orders   Lipid Panel With LDL/HDL Ratio (Completed)   Comprehensive metabolic panel (Completed)     Endocrine   T2DM (type 2 diabetes mellitus) (HCC)    Pt is a diabetic that is well controlled on diet and exercise A1C today is 6.1  Plan: Discussed importance of low carb diet and exercise for diabetes management Discussed importance of regular foot exam and eye exams for diabetes screening Discussed getting PCV 23 but will hold off so pt can get COVID vaccine; will vaccinate at next visit Discussed starting a low dose-statin; will consider starting in the future Performed a foot exam No new medications are indicated at this point Will check Lipid panel and CMP        Other   Hypercholesterolemia    Continue statin Recheck CMP/FLP      Relevant Medications   atenolol (TENORMIN) 50 MG tablet   Other Relevant Orders   Lipid Panel With LDL/HDL Ratio  (Completed)   Comprehensive metabolic panel (Completed)    Other Visit Diagnoses    High serum vitamin B12       Relevant Orders   Vitamin B12 (Completed)          Terrill Mohr, Medical Student [employer]  Patient seen along with MS3 student Terrill Mohr. I personally evaluated this patient along with the student, and verified all aspects of the history, physical exam, and medical decision making as documented by the student. I agree with the student's documentation and have made all necessary edits.  Bacigalupo, Marzella Schlein, MD, MPH [facility]

## 2020-03-09 NOTE — Patient Instructions (Signed)

## 2020-03-10 LAB — COMPREHENSIVE METABOLIC PANEL
ALT: 24 IU/L (ref 0–32)
AST: 19 IU/L (ref 0–40)
Albumin/Globulin Ratio: 2.4 — ABNORMAL HIGH (ref 1.2–2.2)
Albumin: 4.7 g/dL (ref 3.8–4.8)
Alkaline Phosphatase: 59 IU/L (ref 39–117)
BUN/Creatinine Ratio: 29 — ABNORMAL HIGH (ref 12–28)
BUN: 22 mg/dL (ref 8–27)
Bilirubin Total: 0.4 mg/dL (ref 0.0–1.2)
CO2: 21 mmol/L (ref 20–29)
Calcium: 9.8 mg/dL (ref 8.7–10.3)
Chloride: 102 mmol/L (ref 96–106)
Creatinine, Ser: 0.76 mg/dL (ref 0.57–1.00)
GFR calc Af Amer: 98 mL/min/{1.73_m2} (ref >59)
GFR calc non Af Amer: 85 mL/min/{1.73_m2} (ref >59)
Globulin, Total: 2 g/dL (ref 1.5–4.5)
Glucose: 91 mg/dL (ref 65–99)
Potassium: 4.2 mmol/L (ref 3.5–5.2)
Sodium: 142 mmol/L (ref 134–144)
Total Protein: 6.7 g/dL (ref 6.0–8.5)

## 2020-03-10 LAB — LIPID PANEL WITH LDL/HDL RATIO
Cholesterol, Total: 147 mg/dL (ref 100–199)
HDL: 70 mg/dL (ref >39)
LDL Chol Calc (NIH): 59 mg/dL (ref 0–99)
LDL/HDL Ratio: 0.8 ratio (ref 0.0–3.2)
Triglycerides: 97 mg/dL (ref 0–149)
VLDL Cholesterol Cal: 18 mg/dL (ref 5–40)

## 2020-03-10 LAB — VITAMIN B12: Vitamin B-12: 891 pg/mL (ref 232–1245)

## 2020-03-11 NOTE — Assessment & Plan Note (Signed)
Pt with HTN that is controlled with medication Recent trends show elevation in BP  Plan: Will continue on Valsartan-hydrochlorothiazide 320-25 MG once a day Increase atenolol to 50 mg once a day and monitor heart rate Discussed importance of low sodium diet and exercise for BP Recheck metabolic panel and lipid panel

## 2020-03-11 NOTE — Assessment & Plan Note (Addendum)
Pt is a diabetic that is well controlled on diet and exercise A1C today is 6.1  Plan: Discussed importance of low carb diet and exercise for diabetes management Discussed importance of regular foot exam and eye exams for diabetes screening Discussed getting PCV 23 but will hold off so pt can get COVID vaccine; will vaccinate at next visit Discussed starting a low dose-statin; will consider starting in the future Performed a foot exam No new medications are indicated at this point Will check Lipid panel and CMP

## 2020-03-12 ENCOUNTER — Telehealth: Payer: Self-pay

## 2020-03-12 NOTE — Telephone Encounter (Signed)
-----   Message from Erasmo Downer, MD sent at 03/12/2020  2:44 PM EDT ----- Normal labs

## 2020-03-12 NOTE — Telephone Encounter (Signed)
Patient advised as below.  

## 2020-03-12 NOTE — Assessment & Plan Note (Signed)
Continue statin Recheck CMP/FLP

## 2020-03-14 ENCOUNTER — Other Ambulatory Visit: Payer: Self-pay

## 2020-03-14 ENCOUNTER — Ambulatory Visit
Admission: RE | Admit: 2020-03-14 | Discharge: 2020-03-14 | Disposition: A | Discharge status: Home / Self Care | Payer: Managed Care, Other (non HMO) | Source: Ambulatory Visit | Attending: Physician Assistant | Admitting: Physician Assistant

## 2020-03-14 DIAGNOSIS — Z1231 Encounter for screening mammogram for malignant neoplasm of breast: Secondary | ICD-10-CM | POA: Diagnosis not present

## 2020-03-15 ENCOUNTER — Ambulatory Visit: Payer: Managed Care, Other (non HMO) | Attending: Internal Medicine

## 2020-03-15 DIAGNOSIS — Z23 Encounter for immunization: Secondary | ICD-10-CM

## 2020-03-15 NOTE — Progress Notes (Signed)
Covid-19 Vaccination Clinic  Name:  TRINIKA PLUHAR    MRN: [id number] DOB: [birth year]  03/15/2020  Ms. Danzy was observed post Covid-19 immunization for 15 minutes without incident. She was provided with Vaccine Information Sheet and instruction to access the V-Safe system.   Ms. Wagley was instructed to call 911 with any severe reactions post vaccine: [name] Difficulty breathing  . Swelling of face and throat  . A fast heartbeat  . A bad rash all over body  . Dizziness and weakness   Immunizations Administered    Name Date Dose VIS Date Route   Pfizer COVID-19 Vaccine 03/15/2020 11:50 AM 0.3 mL 12/09/2019 Intramuscular   Manufacturer: [employer], [employer]   Lot: [id number]   NDC: [address]-9

## 2020-04-03 ENCOUNTER — Ambulatory Visit: Payer: Managed Care, Other (non HMO)

## 2020-04-11 ENCOUNTER — Ambulatory Visit: Payer: Managed Care, Other (non HMO) | Attending: Internal Medicine

## 2020-04-11 DIAGNOSIS — Z23 Encounter for immunization: Secondary | ICD-10-CM

## 2020-04-11 NOTE — Progress Notes (Signed)
Covid-19 Vaccination Clinic  Name:  Angela Hodges    MRN: [id number] DOB: April 06, 1956  04/11/2020  [name] was observed post Covid-19 immunization for 15 minutes without incident. She was provided with Vaccine Information Sheet and instruction to access the V-Safe system.   [name] was instructed to call 911 with any severe reactions post vaccine: [name] Difficulty breathing  . Swelling of face and throat  . A fast heartbeat  . A bad rash all over body  . Dizziness and weakness   Immunizations Administered    Name Date Dose VIS Date Route   Pfizer COVID-19 Vaccine 04/11/2020  4:08 PM 0.3 mL 12/09/2019 Intramuscular   Manufacturer: [employer], [employer]   Lot: [id number]   NDC: [address]-2

## 2020-04-20 ENCOUNTER — Encounter: Payer: Self-pay | Admitting: Physician Assistant

## 2020-05-19 ENCOUNTER — Other Ambulatory Visit: Payer: Self-pay | Admitting: Physician Assistant

## 2020-05-19 DIAGNOSIS — I10 Essential (primary) hypertension: Secondary | ICD-10-CM

## 2020-05-19 NOTE — Telephone Encounter (Signed)
Requested Prescriptions  Pending Prescriptions Disp Refills  . valsartan-hydrochlorothiazide (DIOVAN-HCT) 320-25 MG tablet [Pharmacy Med Name: VALSARTAN/HCTZ 320-25MG  TABLET] 90 tablet 1    Sig: TAKE 1 TABLET BY MOUTH  DAILY     Cardiovascular: ARB + Diuretic Combos Failed - 05/19/2020  6:55 PM      Failed - Last BP in normal range    BP Readings from Last 1 Encounters:  03/09/20 140/80         Passed - K in normal range and within 180 days    Potassium  Date Value Ref Range Status  03/09/2020 4.2 3.5 - 5.2 mmol/L Final  07/25/2014 5.0 3.5 - 5.1 mmol/L Final         Passed - Na in normal range and within 180 days    Sodium  Date Value Ref Range Status  03/09/2020 142 134 - 144 mmol/L Final  07/25/2014 140 136 - 145 mmol/L Final         Passed - Cr in normal range and within 180 days    Creatinine  Date Value Ref Range Status  07/25/2014 0.59 (L) 0.60 - 1.30 mg/dL Final   Creatinine, Ser  Date Value Ref Range Status  03/09/2020 0.76 0.57 - 1.00 mg/dL Final         Passed - Ca in normal range and within 180 days    Calcium  Date Value Ref Range Status  03/09/2020 9.8 8.7 - 10.3 mg/dL Final   Calcium, Total  Date Value Ref Range Status  07/25/2014 9.2 8.5 - 10.1 mg/dL Final         Passed - Patient is not pregnant      Passed - Valid encounter within last 6 months    Recent Outpatient Visits          2 months ago Essential hypertension   [facility] [place], Marzella Schlein, MD   3 months ago Essential hypertension   [facility] [place], Marzella Schlein, [place]   5 months ago Annual physical exam   [facility] Joycelyn Man M, [place]   5 months ago Tinnitus, unspecified laterality   [facility], Jodell Cipro, [place]   5 months ago Essential hypertension   [facility], Alessandra Bevels, [place]      Future Appointments            In 1 month Bacigalupo, Marzella Schlein, MD [facility]

## 2020-06-20 ENCOUNTER — Other Ambulatory Visit: Payer: Self-pay | Admitting: Physician Assistant

## 2020-06-21 ENCOUNTER — Ambulatory Visit: Payer: Managed Care, Other (non HMO) | Admitting: Family Medicine

## 2020-06-22 ENCOUNTER — Ambulatory Visit: Payer: Self-pay | Admitting: Family Medicine

## 2020-06-25 ENCOUNTER — Ambulatory Visit: Payer: Managed Care, Other (non HMO) | Admitting: Family Medicine

## 2020-06-29 ENCOUNTER — Ambulatory Visit: Payer: Managed Care, Other (non HMO) | Admitting: Family Medicine

## 2020-07-03 ENCOUNTER — Other Ambulatory Visit: Payer: Self-pay

## 2020-07-03 ENCOUNTER — Ambulatory Visit: Payer: Managed Care, Other (non HMO) | Admitting: Physician Assistant

## 2020-07-03 ENCOUNTER — Encounter: Payer: Self-pay | Admitting: Physician Assistant

## 2020-07-03 VITALS — BP 132/78 | HR 68 | Temp 98.6°F | Wt 247.4 lb

## 2020-07-03 DIAGNOSIS — E7800 Pure hypercholesterolemia, unspecified: Secondary | ICD-10-CM

## 2020-07-03 DIAGNOSIS — E1169 Type 2 diabetes mellitus with other specified complication: Secondary | ICD-10-CM | POA: Diagnosis not present

## 2020-07-03 DIAGNOSIS — I10 Essential (primary) hypertension: Secondary | ICD-10-CM | POA: Diagnosis not present

## 2020-07-03 DIAGNOSIS — Z23 Encounter for immunization: Secondary | ICD-10-CM

## 2020-07-03 LAB — POCT GLYCOSYLATED HEMOGLOBIN (HGB A1C)
Est. average glucose Bld gHb Est-mCnc: 134
Hemoglobin A1C: 6.3 % — AB (ref 4.0–5.6)

## 2020-07-03 NOTE — Progress Notes (Signed)
Established patient visit   Patient: Angela Hodges   DOB: 23-Aug-1956   64 y.o. Female  MRN: [id number] Visit Date: 07/03/2020  Today's healthcare provider: Trey Sailors, PA-C   Chief Complaint  Patient presents with  . Diabetes  . Hyperlipidemia  . Hypertension  I,Daulton Harbaugh M Josie Burleigh,acting as a scribe for [employer], PA-C.,have documented all relevant documentation on the behalf of Trey Sailors, PA-C,as directed by  Trey Sailors, PA-C while in the presence of Trey Sailors, PA-C.  Subjective    HPI  Hypertension, follow-up  BP Readings from Last 3 Encounters:  07/03/20 132/78  03/09/20 140/80  01/27/20 132/64   Wt Readings from Last 3 Encounters:  07/03/20 247 lb 6.4 oz (112.2 kg)  03/09/20 243 lb (110.2 kg)  01/27/20 239 lb (108.4 kg)     She was last seen for hypertension 3 months ago.  BP at that visit was 140/80. Management since that visit includes increasing Atenolol to 50mg  daily.  She reports good compliance with treatment. She is not having side effects.  She is following a Regular diet. She is not exercising. She does not smoke.  Use of agents associated with hypertension: none.   Outside blood pressures are arranges around 130's-140's/ 80's. Symptoms: No chest pain No chest pressure  No palpitations No syncope  No dyspnea No orthopnea  No paroxysmal nocturnal dyspnea No lower extremity edema   Pertinent labs: Lab Results  Component Value Date   CHOL 147 03/09/2020   HDL 70 03/09/2020   LDLCALC 59 03/09/2020   TRIG 97 03/09/2020   CHOLHDL 3.4 11/21/2019   Lab Results  Component Value Date   NA 142 03/09/2020   K 4.2 03/09/2020   CREATININE 0.76 03/09/2020   GFRNONAA 85 03/09/2020   GFRAA 98 03/09/2020   GLUCOSE 91 03/09/2020     The 10-year ASCVD risk score Denman George DC Jr., et al., 2013) is: 13.1%   Lipid/Cholesterol, Follow-up  Last lipid panel Other pertinent labs  Lab Results  Component Value Date   CHOL  147 03/09/2020   HDL 70 03/09/2020   LDLCALC 59 03/09/2020   TRIG 97 03/09/2020   CHOLHDL 3.4 11/21/2019   Lab Results  Component Value Date   ALT 24 03/09/2020   AST 19 03/09/2020   PLT 284 09/08/2019   TSH 1.220 11/21/2019     She was last seen for this 3 months ago.  Management since that visit includes no changes.  She reports good compliance with treatment. She is not having side effects.    Diabetes Mellitus Type II, Follow-up  Lab Results  Component Value Date   HGBA1C 6.3 (A) 07/03/2020   HGBA1C 6.5 (H) 11/14/2019   HGBA1C 6.4 04/03/2017   Wt Readings from Last 3 Encounters:  07/03/20 247 lb 6.4 oz (112.2 kg)  03/09/20 243 lb (110.2 kg)  01/27/20 239 lb (108.4 kg)   Last seen for diabetes 3 months ago.  Management since then includes no changes. She reports good compliance with treatment. She is not having side effects.  Symptoms: No fatigue No foot ulcerations  No appetite changes No nausea  No paresthesia of the feet  No polydipsia  No polyuria No visual disturbances   No vomiting     Home blood sugar records: not being checked.  Episodes of hypoglycemia? No    Current insulin regiment:none Most Recent Eye Exam: patient reports she had eye exam recently. Current exercise: housecleaning and  no regular exercise Current diet habits: well balanced  Pertinent Labs: Lab Results  Component Value Date   CHOL 147 03/09/2020   HDL 70 03/09/2020   LDLCALC 59 03/09/2020   TRIG 97 03/09/2020   CHOLHDL 3.4 11/21/2019   Lab Results  Component Value Date   NA 142 03/09/2020   K 4.2 03/09/2020   CREATININE 0.76 03/09/2020   GFRNONAA 85 03/09/2020   GFRAA 98 03/09/2020   GLUCOSE 91 03/09/2020         Medications: Outpatient Medications Prior to Visit  Medication Sig  . aspirin 81 MG tablet Take 1 tablet by mouth daily.  [name] atenolol (TENORMIN) 50 MG tablet Take 1 tablet (50 mg total) by mouth daily.  [name] atorvastatin (LIPITOR) 20 MG tablet Take 1  tablet (20 mg total) by mouth daily at 6 PM.  . cetirizine (ZYRTEC) 10 MG tablet Take 10 mg by mouth daily.  . Cholecalciferol 1000 UNITS capsule Take 1 capsule by mouth 3 (three) times daily.  . fluticasone (FLONASE) 50 MCG/ACT nasal spray Place 2 sprays into both nostrils daily.  . meloxicam (MOBIC) 15 MG tablet Take 1 tablet (15 mg total) by mouth daily.  . methocarbamol (ROBAXIN) 500 MG tablet Take 500 mg by mouth 2 (two) times daily.  . Omega-3 Fatty Acids (FISH OIL) 1200 MG CAPS Take by mouth.  . valACYclovir (VALTREX) 1000 MG tablet TAKE 1 TABLET BY MOUTH  DAILY  . valsartan-hydrochlorothiazide (DIOVAN-HCT) 320-25 MG tablet TAKE 1 TABLET BY MOUTH  DAILY   No facility-administered medications prior to visit.    Review of Systems  Constitutional: Negative.   Respiratory: Negative.   Cardiovascular: Negative.   Gastrointestinal: Negative.   Endocrine: Negative.   Musculoskeletal: Negative.   Neurological: Negative.       Objective    BP 132/78 (BP Location: Right Wrist, Patient Position: Sitting, Cuff Size: Normal)   Pulse 68   Temp 98.6 F (37 C) (Oral)   Wt 247 lb 6.4 oz (112.2 kg)   SpO2 98%   BMI 41.17 kg/m    Physical Exam Constitutional:      Appearance: Normal appearance.  Cardiovascular:     Rate and Rhythm: Normal rate and regular rhythm.     Pulses: Normal pulses.     Heart sounds: Normal heart sounds.  Pulmonary:     Effort: Pulmonary effort is normal.     Breath sounds: Normal breath sounds.  Skin:    General: Skin is warm and dry.  Neurological:     General: No focal deficit present.     Mental Status: She is alert and oriented to person, place, and time.  Psychiatric:        Mood and Affect: Mood normal.        Behavior: Behavior normal.       Results for orders placed or performed in visit on 07/03/20  POCT glycosylated hemoglobin (Hb A1C)  Result Value Ref Range   Hemoglobin A1C 6.3 (A) 4.0 - 5.6 %   HbA1c POC (<> result, manual entry)      HbA1c, POC (prediabetic range)     HbA1c, POC (controlled diabetic range)     Est. average glucose Bld gHb Est-mCnc 134     Assessment & Plan    1. Essential hypertension Well controlled Continue current medications  2. Type 2 diabetes mellitus with other specified complication, without long-term current use of insulin (HCC) Well controlled with last A1c 6.5 Continue current medications UTD on  vaccines, eye exam, foot exam On ACEi On Statin Discussed diet and exercise   - POCT glycosylated hemoglobin (Hb A1C)  3. Hypercholesterolemia Previously well controlled Continue statin Goal LDL < 70   4. Need for diphtheria-tetanus-pertussis (Tdap) vaccine  - Tdap vaccine greater than or equal to 7yo IM  5. Need for 23-polyvalent pneumococcal polysaccharide vaccine  - Pneumococcal polysaccharide vaccine 23-valent greater than or equal to 2yo subcutaneous/IM   Return in about 4 months (around 11/03/2020) for DM,HTN,HLD.       ITrey Sailors, PA-C, have reviewed all documentation for this visit. The documentation on 07/04/20 for the exam, diagnosis, procedures, and orders are all accurate and complete.    [name]  [facility] [phone] (phone) [phone] (fax)  [facility]

## 2020-07-03 NOTE — Patient Instructions (Signed)
Diabetes Mellitus and Exercise Exercising regularly is important for your overall health, especially when you have diabetes (diabetes mellitus). Exercising is not only about losing weight. It has many other health benefits, such as increasing muscle strength and bone density and reducing body fat and stress. This leads to improved fitness, flexibility, and endurance, all of which result in better overall health. Exercise has additional benefits for people with diabetes, including:  Reducing appetite.  Helping to lower and control blood glucose.  Lowering blood pressure.  Helping to control amounts of fatty substances (lipids) in the blood, such as cholesterol and triglycerides.  Helping the body to respond better to insulin (improving insulin sensitivity).  Reducing how much insulin the body needs.  Decreasing the risk for heart disease by: ? Lowering cholesterol and triglyceride levels. ? Increasing the levels of good cholesterol. ? Lowering blood glucose levels. What is my activity plan? Your health care provider or certified diabetes educator can help you make a plan for the type and frequency of exercise (activity plan) that works for you. Make sure that you:  Do at least 150 minutes of moderate-intensity or vigorous-intensity exercise each week. This could be brisk walking, biking, or water aerobics. ? Do stretching and strength exercises, such as yoga or weightlifting, at least 2 times a week. ? Spread out your activity over at least 3 days of the week.  Get some form of physical activity every day. ? Do not go more than 2 days in a row without some kind of physical activity. ? Avoid being inactive for more than 30 minutes at a time. Take frequent breaks to walk or stretch.  Choose a type of exercise or activity that you enjoy, and set realistic goals.  Start slowly, and gradually increase the intensity of your exercise over time. What do I need to know about managing my  diabetes?   Check your blood glucose before and after exercising. ? If your blood glucose is 240 mg/dL (13.3 mmol/L) or higher before you exercise, check your urine for ketones. If you have ketones in your urine, do not exercise until your blood glucose returns to normal. ? If your blood glucose is 100 mg/dL (5.6 mmol/L) or lower, eat a snack containing 15-20 grams of carbohydrate. Check your blood glucose 15 minutes after the snack to make sure that your level is above 100 mg/dL (5.6 mmol/L) before you start your exercise.  Know the symptoms of low blood glucose (hypoglycemia) and how to treat it. Your risk for hypoglycemia increases during and after exercise. Common symptoms of hypoglycemia can include: ? Hunger. ? Anxiety. ? Sweating and feeling clammy. ? Confusion. ? Dizziness or feeling light-headed. ? Increased heart rate or palpitations. ? Blurry vision. ? Tingling or numbness around the mouth, lips, or tongue. ? Tremors or shakes. ? Irritability.  Keep a rapid-acting carbohydrate snack available before, during, and after exercise to help prevent or treat hypoglycemia.  Avoid injecting insulin into areas of the body that are going to be exercised. For example, avoid injecting insulin into: ? The arms, when playing tennis. ? The legs, when jogging.  Keep records of your exercise habits. Doing this can help you and your health care provider adjust your diabetes management plan as needed. Write down: ? Food that you eat before and after you exercise. ? Blood glucose levels before and after you exercise. ? The type and amount of exercise you have done. ? When your insulin is expected to peak, if you use   insulin. Avoid exercising at times when your insulin is peaking.  When you start a new exercise or activity, work with your health care provider to make sure the activity is safe for you, and to adjust your insulin, medicines, or food intake as needed.  Drink plenty of water while  you exercise to prevent dehydration or heat stroke. Drink enough fluid to keep your urine clear or pale yellow. Summary  Exercising regularly is important for your overall health, especially when you have diabetes (diabetes mellitus).  Exercising has many health benefits, such as increasing muscle strength and bone density and reducing body fat and stress.  Your health care provider or certified diabetes educator can help you make a plan for the type and frequency of exercise (activity plan) that works for you.  When you start a new exercise or activity, work with your health care provider to make sure the activity is safe for you, and to adjust your insulin, medicines, or food intake as needed. This information is not intended to replace advice given to you by your health care provider. Make sure you discuss any questions you have with your health care provider. Document Revised: 07/09/2017 Document Reviewed: 05/26/2016 Elsevier Patient Education  2020 Elsevier Inc.  

## 2020-07-09 NOTE — Progress Notes (Signed)
Established patient visit   Patient: Angela Hodges   DOB: November 27, 1956   64 y.o. Female  MRN: [id number] Visit Date: 07/10/2020  Today's healthcare provider: Trey Sailors, PA-C   Chief Complaint  Patient presents with  . Alopecia   Subjective    HPI  Hair Loss Patient comes in today to discuss hair loss. She feels that one of her medications could be contributing to her hair loss. Had a clump of hair fall out the other day. No stressful events, recent illnesses. No history of thyroid issues. She has never had this happen before.        Medications: Outpatient Medications Prior to Visit  Medication Sig  . aspirin 81 MG tablet Take 1 tablet by mouth daily.  [name] atenolol (TENORMIN) 50 MG tablet Take 1 tablet (50 mg total) by mouth daily.  . cetirizine (ZYRTEC) 10 MG tablet Take 10 mg by mouth daily.  . Cholecalciferol 1000 UNITS capsule Take 1 capsule by mouth 3 (three) times daily.  . fluticasone (FLONASE) 50 MCG/ACT nasal spray Place 2 sprays into both nostrils daily.  . meloxicam (MOBIC) 15 MG tablet Take 1 tablet (15 mg total) by mouth daily.  . methocarbamol (ROBAXIN) 500 MG tablet Take 500 mg by mouth 2 (two) times daily.  . Omega-3 Fatty Acids (FISH OIL) 1200 MG CAPS Take by mouth.  . valACYclovir (VALTREX) 1000 MG tablet TAKE 1 TABLET BY MOUTH  DAILY  . valsartan-hydrochlorothiazide (DIOVAN-HCT) 320-25 MG tablet TAKE 1 TABLET BY MOUTH  DAILY  . [DISCONTINUED] atorvastatin (LIPITOR) 20 MG tablet Take 1 tablet (20 mg total) by mouth daily at 6 PM.   No facility-administered medications prior to visit.    Review of Systems  Constitutional: Negative.   Endocrine: Negative.   Skin: Negative.   Neurological: Negative.       Objective    BP 136/74   Pulse 84   Temp (!) 97.1 F (36.2 C)   Wt 247 lb (112 kg)   BMI 41.10 kg/m    Physical Exam Constitutional:      Appearance: She is obese.  Pulmonary:     Effort: Pulmonary effort is normal.  Skin:     General: Skin is warm and dry.       Neurological:     Mental Status: She is alert and oriented to person, place, and time. Mental status is at baseline.  Psychiatric:        Mood and Affect: Mood normal.        Behavior: Behavior normal.       Results for orders placed or performed in visit on 07/10/20  TSH  Result Value Ref Range   TSH 1.300 0.450 - 4.500 uIU/mL    Assessment & Plan    1. Hair loss  Suggest rogaine, evaluate TSH as below. Also recommend dermatology if she is not improving and she is established with Dr. Adolphus Birchwood.  - TSH  2. Hypercholesterolemia Discontinue rare classwide side effect of statins causing hair loss. She does have DM and is at elevated risk for CVD. Will try switching to crestor and observe for improvement. F/U in 6 weeks to reassess.  - rosuvastatin (CRESTOR) 5 MG tablet; Take 1 tablet (5 mg total) by mouth daily.  Dispense: 90 tablet; Refill: 1    Return if symptoms worsen or fail to improve.      ITrey Sailors, PA-C, have reviewed all documentation for this visit. The documentation on  07/13/20 for the exam, diagnosis, procedures, and orders are all accurate and complete.    [name]  [facility] [phone] (phone) [phone] (fax)  [facility]

## 2020-07-10 ENCOUNTER — Ambulatory Visit: Payer: Managed Care, Other (non HMO) | Admitting: Physician Assistant

## 2020-07-10 ENCOUNTER — Other Ambulatory Visit: Payer: Self-pay

## 2020-07-10 ENCOUNTER — Encounter: Payer: Self-pay | Admitting: Physician Assistant

## 2020-07-10 VITALS — BP 136/74 | HR 84 | Temp 97.1°F | Wt 247.0 lb

## 2020-07-10 DIAGNOSIS — L659 Nonscarring hair loss, unspecified: Secondary | ICD-10-CM | POA: Diagnosis not present

## 2020-07-10 DIAGNOSIS — E7800 Pure hypercholesterolemia, unspecified: Secondary | ICD-10-CM

## 2020-07-10 MED ORDER — ROSUVASTATIN CALCIUM 5 MG PO TABS: 5.0000 mg | ORAL_TABLET | Freq: Every day | ORAL | 1 refills | Status: DC

## 2020-07-11 LAB — TSH: TSH: 1.3 u[IU]/mL (ref 0.450–4.500)

## 2020-07-18 LAB — HM DIABETES EYE EXAM

## 2020-07-19 ENCOUNTER — Emergency Department: Payer: Managed Care, Other (non HMO)

## 2020-07-19 ENCOUNTER — Emergency Department
Admission: EM | Admit: 2020-07-19 | Discharge: 2020-07-19 | Disposition: A | Discharge status: Home / Self Care | Payer: Managed Care, Other (non HMO) | Attending: Emergency Medicine | Admitting: Emergency Medicine

## 2020-07-19 ENCOUNTER — Other Ambulatory Visit: Payer: Self-pay

## 2020-07-19 DIAGNOSIS — Y92410 Unspecified street and highway as the place of occurrence of the external cause: Secondary | ICD-10-CM | POA: Insufficient documentation

## 2020-07-19 DIAGNOSIS — M7918 Myalgia, other site: Secondary | ICD-10-CM

## 2020-07-19 DIAGNOSIS — Z7982 Long term (current) use of aspirin: Secondary | ICD-10-CM | POA: Insufficient documentation

## 2020-07-19 DIAGNOSIS — Z79899 Other long term (current) drug therapy: Secondary | ICD-10-CM | POA: Insufficient documentation

## 2020-07-19 DIAGNOSIS — I10 Essential (primary) hypertension: Secondary | ICD-10-CM | POA: Insufficient documentation

## 2020-07-19 DIAGNOSIS — E119 Type 2 diabetes mellitus without complications: Secondary | ICD-10-CM | POA: Diagnosis not present

## 2020-07-19 DIAGNOSIS — Y999 Unspecified external cause status: Secondary | ICD-10-CM | POA: Insufficient documentation

## 2020-07-19 DIAGNOSIS — R519 Headache, unspecified: Secondary | ICD-10-CM | POA: Insufficient documentation

## 2020-07-19 DIAGNOSIS — S169XXA Unspecified injury of muscle, fascia and tendon at neck level, initial encounter: Secondary | ICD-10-CM | POA: Diagnosis present

## 2020-07-19 DIAGNOSIS — M545 Low back pain: Secondary | ICD-10-CM | POA: Insufficient documentation

## 2020-07-19 DIAGNOSIS — Y9389 Activity, other specified: Secondary | ICD-10-CM | POA: Insufficient documentation

## 2020-07-19 DIAGNOSIS — S161XXA Strain of muscle, fascia and tendon at neck level, initial encounter: Secondary | ICD-10-CM | POA: Diagnosis not present

## 2020-07-19 MED ORDER — ORPHENADRINE CITRATE 30 MG/ML IJ SOLN
60.0000 mg | Freq: Two times a day (BID) | INTRAMUSCULAR | Status: DC
  Administered 2020-07-19: 60 mg via INTRAMUSCULAR
  Filled 2020-07-19: qty 2

## 2020-07-19 MED ORDER — ONDANSETRON 8 MG PO TBDP
8.0000 mg | ORAL_TABLET | Freq: Once | ORAL | Status: AC
  Administered 2020-07-19: 8 mg via ORAL
  Filled 2020-07-19: qty 1

## 2020-07-19 MED ORDER — HYDROMORPHONE HCL 1 MG/ML IJ SOLN
1.0000 mg | Freq: Once | INTRAMUSCULAR | Status: AC
  Administered 2020-07-19: 1 mg via INTRAMUSCULAR
  Filled 2020-07-19: qty 1

## 2020-07-19 MED ORDER — CYCLOBENZAPRINE HCL 10 MG PO TABS: 10.0000 mg | ORAL_TABLET | Freq: Three times a day (TID) | ORAL | 0 refills | Status: DC | PRN

## 2020-07-19 MED ORDER — OXYCODONE-ACETAMINOPHEN 7.5-325 MG PO TABS: 1.0000 | ORAL_TABLET | Freq: Four times a day (QID) | ORAL | 0 refills | Status: AC | PRN
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
## 2020-07-19 NOTE — Discharge Instructions (Signed)
CT of the head and neck were unremarkable.  Follow discharge care instruction take medication as directed.

## 2020-07-19 NOTE — ED Notes (Signed)
Pt nauseated from meds.  PA will order meds before discharge

## 2020-07-26 ENCOUNTER — Ambulatory Visit: Payer: Managed Care, Other (non HMO) | Admitting: Physician Assistant

## 2020-07-26 ENCOUNTER — Other Ambulatory Visit: Payer: Self-pay

## 2020-07-26 ENCOUNTER — Encounter: Payer: Self-pay | Admitting: Physician Assistant

## 2020-07-26 VITALS — BP 136/82 | HR 76 | Temp 95.9°F

## 2020-07-26 DIAGNOSIS — M25512 Pain in left shoulder: Secondary | ICD-10-CM | POA: Diagnosis not present

## 2020-07-26 MED ORDER — HYDROCODONE-ACETAMINOPHEN 5-325 MG PO TABS: 1.0000 | ORAL_TABLET | Freq: Two times a day (BID) | ORAL | 0 refills | Status: AC
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
## 2020-07-26 NOTE — Patient Instructions (Signed)
Shoulder Pain Many things can cause shoulder pain, including:  An injury to the shoulder.  Overuse of the shoulder.  Arthritis. The source of the pain can be:  Inflammation.  An injury to the shoulder joint.  An injury to a tendon, ligament, or bone. Follow these instructions at home: Pay attention to changes in your symptoms. Let your health care provider know about them. Follow these instructions to relieve your pain. If you have a sling:  Wear the sling as told by your health care provider. Remove it only as told by your health care provider.  Loosen the sling if your fingers tingle, become numb, or turn cold and blue.  Keep the sling clean.  If the sling is not waterproof: ? Do not let it get wet. Remove it to shower or bathe.  Move your arm as little as possible, but keep your hand moving to prevent swelling. Managing pain, stiffness, and swelling   If directed, put ice on the painful area: ? Put ice in a plastic bag. ? Place a towel between your skin and the bag. ? Leave the ice on for 20 minutes, 2-3 times per day. Stop applying ice if it does not help with the pain.  Squeeze a soft ball or a foam pad as much as possible. This helps to keep the shoulder from swelling. It also helps to strengthen the arm. General instructions  Take over-the-counter and prescription medicines only as told by your health care provider.  Keep all follow-up visits as told by your health care provider. This is important. Contact a health care provider if:  Your pain gets worse.  Your pain is not relieved with medicines.  New pain develops in your arm, hand, or fingers. Get help right away if:  Your arm, hand, or fingers: ? Tingle. ? Become numb. ? Become swollen. ? Become painful. ? Turn white or blue. Summary  Shoulder pain can be caused by an injury, overuse, or arthritis.  Pay attention to changes in your symptoms. Let your health care provider know about  them.  This condition may be treated with a sling, ice, and pain medicines.  Contact your health care provider if the pain gets worse or new pain develops. Get help right away if your arm, hand, or fingers tingle or become numb, swollen, or painful.  Keep all follow-up visits as told by your health care provider. This is important. This information is not intended to replace advice given to you by your health care provider. Make sure you discuss any questions you have with your health care provider. Document Revised: 06/29/2018 Document Reviewed: 06/29/2018 Elsevier Patient Education  2020 [employer]vier Inc.  
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
## 2020-08-10 ENCOUNTER — Ambulatory Visit: Payer: Managed Care, Other (non HMO) | Admitting: Physician Assistant

## 2020-08-10 ENCOUNTER — Encounter: Payer: Self-pay | Admitting: Physician Assistant

## 2020-08-10 ENCOUNTER — Other Ambulatory Visit: Payer: Self-pay

## 2020-08-10 ENCOUNTER — Telehealth: Payer: Self-pay | Admitting: *Deleted

## 2020-08-10 VITALS — BP 154/85 | HR 72 | Wt 245.0 lb

## 2020-08-10 DIAGNOSIS — M25512 Pain in left shoulder: Secondary | ICD-10-CM | POA: Diagnosis not present

## 2020-08-10 DIAGNOSIS — R600 Localized edema: Secondary | ICD-10-CM | POA: Diagnosis not present

## 2020-08-10 DIAGNOSIS — F419 Anxiety disorder, unspecified: Secondary | ICD-10-CM | POA: Diagnosis not present

## 2020-08-10 MED ORDER — HYDROXYZINE HCL 10 MG PO TABS: 10.0000 mg | ORAL_TABLET | Freq: Three times a day (TID) | ORAL | 0 refills | Status: DC | PRN

## 2020-08-10 NOTE — Patient Instructions (Signed)
Shoulder Pain Many things can cause shoulder pain, including:  An injury.  Moving the shoulder in the same way again and again (overuse).  Joint pain (arthritis). Pain can come from:  Swelling and irritation (inflammation) of any part of the shoulder.  An injury to the shoulder joint.  An injury to: ? Tissues that connect muscle to bone (tendons). ? Tissues that connect bones to each other (ligaments). ? Bones. Follow these instructions at home: Watch for changes in your symptoms. Let your doctor know about them. Follow these instructions to help with your pain. If you have a sling:  Wear the sling as told by your doctor. Remove it only as told by your doctor.  Loosen the sling if your fingers: ? Tingle. ? Become numb. ? Turn cold and blue.  Keep the sling clean.  If the sling is not waterproof: ? Do not let it get wet. ? Take the sling off when you shower or bathe. Managing pain, stiffness, and swelling   If told, put ice on the painful area: ? Put ice in a plastic bag. ? Place a towel between your skin and the bag. ? Leave the ice on for 20 minutes, 2-3 times a day. Stop putting ice on if it does not help with the pain.  Squeeze a soft ball or a foam pad as much as possible. This prevents swelling in the shoulder. It also helps to strengthen the arm. General instructions  Take over-the-counter and prescription medicines only as told by your doctor.  Keep all follow-up visits as told by your doctor. This is important. Contact a doctor if:  Your pain gets worse.  Medicine does not help your pain.  You have new pain in your arm, hand, or fingers. Get help right away if:  Your arm, hand, or fingers: ? Tingle. ? Are numb. ? Are swollen. ? Are painful. ? Turn white or blue. Summary  Shoulder pain can be caused by many things. These include injury, moving the shoulder in the same away again and again, and joint pain.  Watch for changes in your symptoms.  Let your doctor know about them.  This condition may be treated with a sling, ice, and pain medicine.  Contact your doctor if the pain gets worse or you have new pain. Get help right away if your arm, hand, or fingers tingle or get numb, swollen, or painful.  Keep all follow-up visits as told by your doctor. This is important. This information is not intended to replace advice given to you by your health care provider. Make sure you discuss any questions you have with your health care provider. Document Revised: 06/29/2018 Document Reviewed: 06/29/2018 Elsevier Patient Education  2020 Elsevier Inc.  

## 2020-08-10 NOTE — Chronic Care Management (AMB) (Signed)
Care Management   Note  08/10/2020 Name: GABREAL MONNOT MRN: [id number] DOB: August 09, 1956  VANISSA GAGNON is a 64 y.o. year old female who is a primary care patient of Beryle Flock, Marzella Schlein, MD. I reached out to [name] by phone today in response to a referral sent by Ms. Bridget Hartshorn Fortunato's health plan.    Ms. Peterman was given information about care management services today including:  1. Care management services include personalized support from designated clinical staff supervised by her physician, including individualized plan of care and coordination with other care providers 2. 24/7 contact phone numbers for assistance for urgent and routine care needs. 3. The patient may stop care management services at any time by phone call to the office staff.  Patient agreed to services and verbal consent obtained.   Follow up plan: Telephone appointment with care management team member scheduled for: 08/17/2020  [facility] Guide, Embedded Care Coordination [facility]  [place], [place] [address] Direct Dial: [phone] [email or web address] Website: [email or web address]

## 2020-08-17 ENCOUNTER — Ambulatory Visit: Payer: Self-pay | Admitting: Family Medicine

## 2020-08-17 ENCOUNTER — Ambulatory Visit: Payer: Managed Care, Other (non HMO) | Admitting: *Deleted

## 2020-08-17 DIAGNOSIS — M25512 Pain in left shoulder: Secondary | ICD-10-CM

## 2020-08-17 DIAGNOSIS — F419 Anxiety disorder, unspecified: Secondary | ICD-10-CM
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
## 2020-08-21 ENCOUNTER — Other Ambulatory Visit: Payer: Self-pay

## 2020-08-21 ENCOUNTER — Ambulatory Visit: Payer: Self-pay

## 2020-08-21 ENCOUNTER — Ambulatory Visit
Admission: EM | Admit: 2020-08-21 | Discharge: 2020-08-21 | Disposition: A | Discharge status: Home / Self Care | Payer: Managed Care, Other (non HMO) | Attending: Family Medicine | Admitting: Family Medicine

## 2020-08-21 ENCOUNTER — Ambulatory Visit (INDEPENDENT_AMBULATORY_CARE_PROVIDER_SITE_OTHER)
Admit: 2020-08-21 | Discharge: 2020-08-21 | Disposition: A | Discharge status: Home / Self Care | Payer: Managed Care, Other (non HMO)

## 2020-08-21 DIAGNOSIS — R1031 Right lower quadrant pain: Secondary | ICD-10-CM | POA: Diagnosis not present

## 2020-08-21 LAB — COMPREHENSIVE METABOLIC PANEL
ALT: 19 U/L (ref 0–44)
AST: 16 U/L (ref 15–41)
Albumin: 4.5 g/dL (ref 3.5–5.0)
Alkaline Phosphatase: 46 U/L (ref 38–126)
Anion gap: 10 (ref 5–15)
BUN: 31 mg/dL — ABNORMAL HIGH (ref 8–23)
CO2: 27 mmol/L (ref 22–32)
Calcium: 9.8 mg/dL (ref 8.9–10.3)
Chloride: 104 mmol/L (ref 98–111)
Creatinine, Ser: 0.93 mg/dL (ref 0.44–1.00)
GFR calc Af Amer: >60 mL/min (ref >60)
GFR calc non Af Amer: >60 mL/min (ref >60)
Glucose, Bld: 94 mg/dL (ref 70–99)
Potassium: 4.3 mmol/L (ref 3.5–5.1)
Sodium: 141 mmol/L (ref 135–145)
Total Bilirubin: 0.2 mg/dL — ABNORMAL LOW (ref 0.3–1.2)
Total Protein: 8 g/dL (ref 6.5–8.1)

## 2020-08-21 LAB — CBC WITH DIFFERENTIAL/PLATELET
Abs Immature Granulocytes: 0.03 10*3/uL (ref 0.00–0.07)
Basophils Absolute: 0 10*3/uL (ref 0.0–0.1)
Basophils Relative: 0 %
Eosinophils Absolute: 0.5 10*3/uL (ref 0.0–0.5)
Eosinophils Relative: 5 %
HCT: 38 % (ref 36.0–46.0)
Hemoglobin: 12.4 g/dL (ref 12.0–15.0)
Immature Granulocytes: 0 %
Lymphocytes Relative: 38 %
Lymphs Abs: 3.6 10*3/uL (ref 0.7–4.0)
MCH: 31.2 pg (ref 26.0–34.0)
MCHC: 32.6 g/dL (ref 30.0–36.0)
MCV: 95.5 fL (ref 80.0–100.0)
Monocytes Absolute: 0.6 10*3/uL (ref 0.1–1.0)
Monocytes Relative: 6 %
Neutro Abs: 4.7 10*3/uL (ref 1.7–7.7)
Neutrophils Relative %: 51 %
Platelets: 292 10*3/uL (ref 150–400)
RBC: 3.98 MIL/uL (ref 3.87–5.11)
RDW: 13.5 % (ref 11.5–15.5)
WBC: 9.5 10*3/uL (ref 4.0–10.5)
nRBC: 0 % (ref 0.0–0.2)

## 2020-08-21 MED ORDER — NAPROXEN 500 MG PO TABS: 500.0000 mg | ORAL_TABLET | Freq: Two times a day (BID) | ORAL | 0 refills | Status: DC | PRN

## 2020-08-21 MED ORDER — IOHEXOL 300 MG/ML  SOLN
100.0000 mL | Freq: Once | INTRAMUSCULAR | Status: AC | PRN
  Administered 2020-08-21: 100 mL via INTRAVENOUS

## 2020-08-21 NOTE — Discharge Instructions (Addendum)
No evident cause for your pain.  CT and labs were unrevealing.  Medication as prescribed.  Follow up with your doctor.

## 2020-08-21 NOTE — ED Triage Notes (Signed)
Patient in today w/ c/o RLQ pain. Patient states it's "right in the bend" of her side. Patient states it's been ongoing for a couple months intermittently but over the past few days, it's become more progressive and painful.

## 2020-08-22 ENCOUNTER — Telehealth: Payer: Self-pay | Admitting: *Deleted

## 2020-08-22 ENCOUNTER — Ambulatory Visit: Payer: Self-pay | Admitting: *Deleted
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
## 2020-08-24 ENCOUNTER — Ambulatory Visit: Payer: Managed Care, Other (non HMO) | Admitting: Physician Assistant

## 2020-08-24 ENCOUNTER — Ambulatory Visit: Payer: Self-pay | Admitting: *Deleted

## 2020-08-24 DIAGNOSIS — M25512 Pain in left shoulder: Secondary | ICD-10-CM

## 2020-08-24 DIAGNOSIS — E1169 Type 2 diabetes mellitus with other specified complication: Secondary | ICD-10-CM
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
## 2020-08-28 ENCOUNTER — Ambulatory Visit: Payer: Self-pay | Admitting: *Deleted

## 2020-08-28 ENCOUNTER — Other Ambulatory Visit: Payer: Self-pay | Admitting: Physician Assistant

## 2020-08-28 ENCOUNTER — Encounter: Payer: Self-pay | Admitting: Family Medicine

## 2020-08-28 DIAGNOSIS — F419 Anxiety disorder, unspecified: Secondary | ICD-10-CM

## 2020-08-28 DIAGNOSIS — E7800 Pure hypercholesterolemia, unspecified: Secondary | ICD-10-CM
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
## 2020-08-30 NOTE — Progress Notes (Deleted)
Established patient visit   Patient: Angela Hodges   DOB: 01-12-1956   64 y.o. Female  MRN: [id number] Visit Date: 08/31/2020  Today's healthcare provider: Shirlee Latch, MD   No chief complaint on file.  Subjective    HPI  Hypertension, follow-up  BP Readings from Last 3 Encounters:  08/21/20 (!) 142/79  08/10/20 (!) 154/85  07/26/20 (!) 136/82   Wt Readings from Last 3 Encounters:  08/21/20 251 lb (113.9 kg)  08/10/20 245 lb (111.1 kg)  07/19/20 (!) 247 lb (112 kg)     She was last seen for hypertension 1 months ago.  BP at that visit was 136/82. Management since that visit includes no changes.  She reports {excellent/good/fair/poor:19665} compliance with treatment. She {is/is not:9024} having side effects. {document side effects if present:1} She is following a {diet:21022986} diet. She {is/is not:9024} exercising. She {does/does not:200015} smoke.  Use of agents associated with hypertension: {bp agents assoc with hypertension:511::"none"}.   Outside blood pressures are {***enter patient reported home BP readings, or 'not being checked':1}.  Pertinent labs: Lab Results  Component Value Date   CHOL 147 03/09/2020   HDL 70 03/09/2020   LDLCALC 59 03/09/2020   TRIG 97 03/09/2020   CHOLHDL 3.4 11/21/2019   Lab Results  Component Value Date   NA 141 08/21/2020   K 4.3 08/21/2020   CREATININE 0.93 08/21/2020   GFRNONAA >60 08/21/2020   GFRAA >60 08/21/2020   GLUCOSE 94 08/21/2020     The 10-year ASCVD risk score Denman George DC Jr., et al., 2013) is: 15.7%   --------------------------------------------------------------------------------------------------- Lipid/Cholesterol, Follow-up  She was last seen for this 1 months ago.  Management since that visit includes no changes.  She reports {excellent/good/fair/poor:19665} compliance with treatment. She {is/is not:9024} having side effects. {document side effects if present:1}  Current diet: {diet  habits:16563} Current exercise: {exercise types:16438}  The 10-year ASCVD risk score Denman George DC Jr., et al., 2013) is: 15.7%  ---------------------------------------------------------------------------------------------------  {Show patient history (optional):23778::" "}   Medications: Outpatient Medications Prior to Visit  Medication Sig  . aspirin 81 MG tablet Take 1 tablet by mouth daily.  [name] atenolol (TENORMIN) 50 MG tablet Take 1 tablet (50 mg total) by mouth daily.  . cetirizine (ZYRTEC) 10 MG tablet Take 10 mg by mouth daily.  . Cholecalciferol 1000 UNITS capsule Take 1 capsule by mouth 3 (three) times daily.  . fluticasone (FLONASE) 50 MCG/ACT nasal spray Place 2 sprays into both nostrils daily.  . hydrOXYzine (ATARAX/VISTARIL) 10 MG tablet Take 1 tablet (10 mg total) by mouth 3 (three) times daily as needed.  . naproxen (NAPROSYN) 500 MG tablet Take 1 tablet (500 mg total) by mouth 2 (two) times daily as needed for moderate pain.  . Omega-3 Fatty Acids (FISH OIL) 1200 MG CAPS Take by mouth.  . rosuvastatin (CRESTOR) 5 MG tablet Take 1 tablet (5 mg total) by mouth daily.  . valACYclovir (VALTREX) 1000 MG tablet TAKE 1 TABLET BY MOUTH  DAILY  . valsartan-hydrochlorothiazide (DIOVAN-HCT) 320-25 MG tablet TAKE 1 TABLET BY MOUTH  DAILY   No facility-administered medications prior to visit.    Review of Systems  {Heme  Chem  Endocrine  Serology  Results Review (optional):23779::" "}  Objective    There were no vitals taken for this visit. {Show previous vital signs (optional):23777::" "}  Physical Exam  ***  No results found for any visits on 08/31/20.  Assessment & Plan     ***  No  follow-ups on file.      {provider attestation***:1}   Shirlee Latch, MD  [facility] [phone] (phone) [phone] (fax)  [facility]

## 2020-08-31 ENCOUNTER — Ambulatory Visit (INDEPENDENT_AMBULATORY_CARE_PROVIDER_SITE_OTHER): Payer: Managed Care, Other (non HMO) | Admitting: Family Medicine

## 2020-08-31 ENCOUNTER — Other Ambulatory Visit: Payer: Self-pay

## 2020-08-31 ENCOUNTER — Encounter: Payer: Self-pay | Admitting: Family Medicine

## 2020-08-31 VITALS — BP 126/78 | HR 59 | Temp 98.0°F | Wt 250.6 lb

## 2020-08-31 DIAGNOSIS — I10 Essential (primary) hypertension: Secondary | ICD-10-CM

## 2020-08-31 DIAGNOSIS — L650 Telogen effluvium: Secondary | ICD-10-CM | POA: Diagnosis not present

## 2020-08-31 NOTE — Progress Notes (Signed)
Angela Hodges,acting as a scribe for Angela Latch, MD.,have documented all relevant documentation on the behalf of Angela Latch, MD,as directed by  Angela Latch, MD while in the presence of Angela Latch, MD.  Established patient visit   Patient: Angela Hodges   DOB: 04-20-1956   64 y.o. Female  MRN: [id number] Visit Date: 08/31/2020  Today's healthcare provider: Shirlee Latch, MD   Chief Complaint  Patient presents with  . Follow-up   Subjective    HPI  Hypertension, follow-up  BP Readings from Last 3 Encounters:  08/31/20 126/78  08/21/20 (!) 142/79  08/10/20 (!) 154/85   Wt Readings from Last 3 Encounters:  08/31/20 250 lb 9.6 oz (113.7 kg)  08/21/20 251 lb (113.9 kg)  08/10/20 245 lb (111.1 kg)     She was last seen for hypertension 1 months ago.  BP at that visit was 136/82. Management since that visit includes no change.  She reports excellent compliance with treatment. She is not having side effects.  She is following a Regular diet. She is exercising. She does not smoke.  Use of agents associated with hypertension: none.   Outside blood pressures are not being checked regularly.  Pertinent labs: Lab Results  Component Value Date   CHOL 147 03/09/2020   HDL 70 03/09/2020   LDLCALC 59 03/09/2020   TRIG 97 03/09/2020   CHOLHDL 3.4 11/21/2019   Lab Results  Component Value Date   NA 141 08/21/2020   K 4.3 08/21/2020   CREATININE 0.93 08/21/2020   GFRNONAA >60 08/21/2020   GFRAA >60 08/21/2020   GLUCOSE 94 08/21/2020     The 10-year ASCVD risk score Denman George DC Jr., et al., 2013) is: 11.6%   ---------------------------------------------------------------------------------------------------  Social History   Tobacco Use  . Smoking status: Never Smoker  . Smokeless tobacco: Never Used  Vaping Use  . Vaping Use: Never used  Substance Use Topics  . Alcohol use: No  . Drug use: No        Medications: Outpatient Medications Prior to Visit  Medication Sig  . aspirin 81 MG tablet Take 1 tablet by mouth daily.  . cetirizine (ZYRTEC) 10 MG tablet Take 10 mg by mouth daily.  . Cholecalciferol 1000 UNITS capsule Take 1 capsule by mouth 3 (three) times daily.  . fluticasone (FLONASE) 50 MCG/ACT nasal spray Place 2 sprays into both nostrils daily.  . naproxen (NAPROSYN) 500 MG tablet Take 1 tablet (500 mg total) by mouth 2 (two) times daily as needed for moderate pain.  . Omega-3 Fatty Acids (FISH OIL) 1200 MG CAPS Take by mouth.  . rosuvastatin (CRESTOR) 5 MG tablet Take 1 tablet (5 mg total) by mouth daily.  . valACYclovir (VALTREX) 1000 MG tablet TAKE 1 TABLET BY MOUTH  DAILY  . valsartan-hydrochlorothiazide (DIOVAN-HCT) 320-25 MG tablet TAKE 1 TABLET BY MOUTH  DAILY  . atenolol (TENORMIN) 50 MG tablet Take 1 tablet (50 mg total) by mouth daily. (Patient taking differently: Take 25 mg by mouth daily. )  . [DISCONTINUED] hydrOXYzine (ATARAX/VISTARIL) 10 MG tablet Take 1 tablet (10 mg total) by mouth 3 (three) times daily as needed.   No facility-administered medications prior to visit.    Review of Systems    Objective    BP 126/78 (BP Location: Right Wrist, Patient Position: Sitting, Cuff Size: Large)   Pulse (!) 59   Temp 98 F (36.7 C) (Oral)   Wt 250 lb 9.6 oz (113.7 kg)   BMI  41.70 kg/m    Physical Exam Vitals reviewed.  Constitutional:      Appearance: Normal appearance.  HENT:     Head: Normocephalic and atraumatic.  Eyes:     General: No scleral icterus.    Conjunctiva/sclera: Conjunctivae normal.  Cardiovascular:     Rate and Rhythm: Normal rate and regular rhythm.     Pulses: Normal pulses.     Heart sounds: Normal heart sounds. No murmur heard.   Pulmonary:     Effort: Pulmonary effort is normal. No respiratory distress.     Breath sounds: Normal breath sounds. No wheezing.  Musculoskeletal:     Right lower leg: No edema.     Left lower leg:  No edema.  Skin:    General: Skin is warm and dry.  Neurological:     Mental Status: She is alert and oriented to person, place, and time. Mental status is at baseline.  Psychiatric:        Mood and Affect: Mood normal.        Behavior: Behavior normal.     No results found for any visits on 08/31/20.  Assessment & Plan     Problem List Items Addressed This Visit      Cardiovascular and Mediastinum   Hypertension - Primary    Controlled  Continue current medications  Recheck lipids at next visit  Work on diet and exercise         Musculoskeletal and Integument   Telogen effluvium    Discussed natural course of hair loss and growth Reviewed last labs Hair loss is already starting to improve Continue to monitor         Return in about 2 months (around 10/31/2020).  As scheduled for chronic disease follow-up     I, Angela Latch, MD, have reviewed all documentation for this visit. The documentation on 08/31/20 for the exam, diagnosis, procedures, and orders are all accurate and complete.   Angela Hodges, Angela Schlein, MD, MPH [facility]

## 2020-08-31 NOTE — Assessment & Plan Note (Signed)
Controlled  Continue current medications  Recheck lipids at next visit  Work on diet and exercise

## 2020-08-31 NOTE — Assessment & Plan Note (Signed)
Discussed natural course of hair loss and growth Reviewed last labs Hair loss is already starting to improve Continue to monitor

## 2020-09-13 ENCOUNTER — Other Ambulatory Visit: Payer: Self-pay | Admitting: Physician Assistant

## 2020-09-13 DIAGNOSIS — E7800 Pure hypercholesterolemia, unspecified: Secondary | ICD-10-CM

## 2020-09-26 ENCOUNTER — Other Ambulatory Visit: Payer: Self-pay | Admitting: Family Medicine

## 2020-09-26 DIAGNOSIS — E7800 Pure hypercholesterolemia, unspecified: Secondary | ICD-10-CM

## 2020-09-26 MED ORDER — ATENOLOL 50 MG PO TABS: 25.0000 mg | ORAL_TABLET | Freq: Every day | ORAL | 1 refills | Status: DC

## 2020-09-26 MED ORDER — ROSUVASTATIN CALCIUM 5 MG PO TABS: 5.0000 mg | ORAL_TABLET | Freq: Every day | ORAL | 1 refills | Status: DC

## 2020-09-26 NOTE — Telephone Encounter (Signed)
[facility] faxed refill request for the following medications:      Rosuvastatin Tab atenolol (TENORMIN)  tablet   Please advise. Thanks, [employer]

## 2020-10-02 ENCOUNTER — Other Ambulatory Visit: Payer: Self-pay | Admitting: Physician Assistant

## 2020-10-02 ENCOUNTER — Other Ambulatory Visit: Payer: Self-pay | Admitting: Family Medicine

## 2020-10-02 DIAGNOSIS — E7800 Pure hypercholesterolemia, unspecified: Secondary | ICD-10-CM

## 2020-10-02 DIAGNOSIS — I10 Essential (primary) hypertension: Secondary | ICD-10-CM

## 2020-10-02 NOTE — Telephone Encounter (Signed)
Requested Prescriptions  Pending Prescriptions Disp Refills  . valACYclovir (VALTREX) 1000 MG tablet [Pharmacy Med Name: VALACYCLOVIR  1GM  TAB] 90 tablet 3    Sig: TAKE 1 TABLET BY MOUTH  DAILY     Antimicrobials:  Antiviral Agents - Anti-Herpetic Passed - 10/02/2020  8:44 PM      Passed - Valid encounter within last 12 months    Recent Outpatient Visits          1 month ago Essential hypertension   [facility] [place], Marzella Schlein, MD   1 month ago Acute pain of left shoulder   [facility] [place] Angst [place], [place]   2 months ago Left shoulder pain, unspecified chronicity   [facility] [place], Ricki Rodriguez M, [place]   2 months ago Hair loss   [facility] [place], [place], [place]   3 months ago Essential hypertension   [facility] [place], Lavella [place], [place]      Future Appointments            In 1 month Bacigalupo, Marzella Schlein, MD [facility]

## 2020-10-02 NOTE — Telephone Encounter (Signed)
Requested Prescriptions  Pending Prescriptions Disp Refills  . valsartan-hydrochlorothiazide (DIOVAN-HCT) 320-25 MG tablet [Pharmacy Med Name: Valsartan-hydroCHLOROthiazide 320-25 MG Oral Tablet] 90 tablet 1    Sig: TAKE 1 TABLET BY MOUTH  DAILY     Cardiovascular: ARB + Diuretic Combos Passed - 10/02/2020  8:44 PM      Passed - K in normal range and within 180 days    Potassium  Date Value Ref Range Status  08/21/2020 4.3 3.5 - 5.1 mmol/L Final  07/25/2014 5.0 3.5 - 5.1 mmol/L Final         Passed - Na in normal range and within 180 days    Sodium  Date Value Ref Range Status  08/21/2020 141 135 - 145 mmol/L Final  03/09/2020 142 134 - 144 mmol/L Final  07/25/2014 140 136 - 145 mmol/L Final         Passed - Cr in normal range and within 180 days    Creatinine  Date Value Ref Range Status  07/25/2014 0.59 (L) 0.60 - 1.30 mg/dL Final   Creatinine, Ser  Date Value Ref Range Status  08/21/2020 0.93 0.44 - 1.00 mg/dL Final         Passed - Ca in normal range and within 180 days    Calcium  Date Value Ref Range Status  08/21/2020 9.8 8.9 - 10.3 mg/dL Final   Calcium, Total  Date Value Ref Range Status  07/25/2014 9.2 8.5 - 10.1 mg/dL Final         Passed - Patient is not pregnant      Passed - Last BP in normal range    BP Readings from Last 1 Encounters:  08/31/20 126/78         Passed - Valid encounter within last 6 months    Recent Outpatient Visits          1 month ago Essential hypertension   [facility] [place], Marzella Schlein, MD   1 month ago Acute pain of left shoulder   [facility] Osvaldo Angst M, [place]   2 months ago Left shoulder pain, unspecified chronicity   [facility] [place], Ricki Rodriguez M, [place]   2 months ago Hair loss   [facility] [place], [place], [place]   3 months ago Essential hypertension   [facility] [employer] [place], Lavella Hammock, [place]      Future Appointments            In 1  month Bacigalupo, Marzella Schlein, MD [facility], [facility]

## 2020-10-21 ENCOUNTER — Encounter: Payer: Self-pay | Admitting: Family Medicine

## 2020-10-29 ENCOUNTER — Encounter: Payer: Self-pay | Admitting: Family Medicine

## 2020-11-09 ENCOUNTER — Ambulatory Visit: Payer: Self-pay | Admitting: Family Medicine

## 2020-11-09 ENCOUNTER — Encounter: Payer: Self-pay | Admitting: Family Medicine

## 2020-11-09 ENCOUNTER — Other Ambulatory Visit: Payer: Self-pay

## 2020-11-09 ENCOUNTER — Ambulatory Visit: Payer: Managed Care, Other (non HMO) | Admitting: Family Medicine

## 2020-11-09 VITALS — BP 130/79 | HR 70 | Temp 98.2°F | Resp 16 | Wt 253.0 lb

## 2020-11-09 DIAGNOSIS — I152 Hypertension secondary to endocrine disorders: Secondary | ICD-10-CM

## 2020-11-09 DIAGNOSIS — E1159 Type 2 diabetes mellitus with other circulatory complications: Secondary | ICD-10-CM

## 2020-11-09 DIAGNOSIS — E785 Hyperlipidemia, unspecified: Secondary | ICD-10-CM

## 2020-11-09 DIAGNOSIS — E1169 Type 2 diabetes mellitus with other specified complication: Secondary | ICD-10-CM | POA: Diagnosis not present

## 2020-11-09 DIAGNOSIS — Z6841 Body Mass Index (BMI) 40.0 and over, adult: Secondary | ICD-10-CM | POA: Diagnosis not present

## 2020-11-09 LAB — POCT GLYCOSYLATED HEMOGLOBIN (HGB A1C)
Est. average glucose Bld gHb Est-mCnc: 140
Hemoglobin A1C: 6.5 % — AB (ref 4.0–5.6)

## 2020-11-09 MED ORDER — ATENOLOL 25 MG PO TABS
25.0000 mg | ORAL_TABLET | Freq: Every day | ORAL | 3 refills | Status: DC
Start: 2020-11-09 — End: 2021-08-29

## 2020-11-09 NOTE — Assessment & Plan Note (Signed)
Discussed importance of healthy weight management Discussed diet and exercise  

## 2020-11-09 NOTE — Assessment & Plan Note (Signed)
Discussed importance of healthy weight management °Discussed diet and exercise  °Discussed intermittent fasting °

## 2020-11-09 NOTE — Progress Notes (Signed)
Established patient visit   Patient: Angela Hodges   DOB: 05-03-1956   64 y.o. Female  MRN: [id number] Visit Date: 11/09/2020  Today's healthcare provider: Shirlee Latch, MD   Chief Complaint  Patient presents with  . Diabetes  . Hyperlipidemia   Subjective    HPI  Diabetes Mellitus Type II, Follow-up  Lab Results  Component Value Date   HGBA1C 6.5 (A) 11/09/2020   HGBA1C 6.3 (A) 07/03/2020   HGBA1C 6.5 (H) 11/14/2019   Wt Readings from Last 3 Encounters:  11/09/20 253 lb (114.8 kg)  08/31/20 250 lb 9.6 oz (113.7 kg)  08/21/20 251 lb (113.9 kg)   Last seen for diabetes 8 months ago.  Management since then includes no changes. She reports excellent compliance with treatment. She is not having side effects.  Symptoms: No fatigue No foot ulcerations  No appetite changes No nausea  No paresthesia of the feet  No polydipsia  No polyuria No visual disturbances   No vomiting     Home blood sugar records: fasting range: 110's  Episodes of hypoglycemia? No    Current insulin regiment: none Most Recent Eye Exam: UTD Current exercise: no regular exercise Current diet habits: in general, a "healthy" diet    Pertinent Labs: Lab Results  Component Value Date   CHOL 147 03/09/2020   HDL 70 03/09/2020   LDLCALC 59 03/09/2020   TRIG 97 03/09/2020   CHOLHDL 3.4 11/21/2019   Lab Results  Component Value Date   NA 141 08/21/2020   K 4.3 08/21/2020   CREATININE 0.93 08/21/2020   GFRNONAA >60 08/21/2020   GFRAA >60 08/21/2020   GLUCOSE 94 08/21/2020     ---------------------------------------------------------------------------------------------------  Lipid/Cholesterol, Follow-up  Last lipid panel Other pertinent labs  Lab Results  Component Value Date   CHOL 147 03/09/2020   HDL 70 03/09/2020   LDLCALC 59 03/09/2020   TRIG 97 03/09/2020   CHOLHDL 3.4 11/21/2019   Lab Results  Component Value Date   ALT 19 08/21/2020   AST 16 08/21/2020    PLT 292 08/21/2020   TSH 1.300 07/10/2020     She was last seen for this 8 months ago.  Management since that visit includes no changes.  She reports excellent compliance with treatment. She is not having side effects.   Symptoms: No chest pain No chest pressure/discomfort  No dyspnea No lower extremity edema  No numbness or tingling of extremity No orthopnea  No palpitations No paroxysmal nocturnal dyspnea  No speech difficulty No syncope   Current diet: in general, a "healthy" diet   Current exercise: walking  The 10-year ASCVD risk score Denman George DC Montez Hageman., et al., 2013) is: 12.6%  ---------------------------------------------------------------------------------------------------   Patient Active Problem List   Diagnosis Date Noted  . 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 Palpitations 01/06/2020  . Chest pain of uncertain etiology 01/06/2020  . Bursitis of right hip 10/31/2019  . DJD (degenerative joint disease) of cervical spine 05/28/2017  . Morbid obesity due to excess calories (HCC) 05/02/2016  . BMI 40.0-44.9, adult (HCC) 05/02/2016  . Migraine without aura and without status migrainosus, not intractable 02/15/2016  . Allergic rhinitis 04/23/2015  . T2DM (type 2 diabetes mellitus) (HCC) 04/23/2015  . 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 Acquired equinus deformity of foot 05/03/2012  . Avitaminosis D 04/03/2010  . Hyperlipidemia associated with type 2 diabetes mellitus (HCC) 04/10/2009  . 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 Hypertension associated with diabetes (HCC) 12/29/1998  . 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 Marital status: Married    Spouse name: Not on file  . Number of children: Not on file  . Years of education: Not on file  . Highest education level: Not on file  Occupational History  . Not on file  Tobacco Use  . Smoking status: Never Smoker  . Smokeless tobacco: Never Used  Vaping Use  . Vaping  Use: Never used  Substance and Sexual Activity  . Alcohol use: No  . Drug use: No  . Sexual activity: Not on file  Other Topics Concern  . Not on file  Social History Narrative  . Not on file   Social Determinants of Health   Financial Resource Strain: Low Risk   . Difficulty of Paying Living Expenses: Not hard at all  Food Insecurity: No Food Insecurity  . Worried About Programme researcher, broadcasting/film/video in the Last Year: Never true  . Ran Out of Food in the Last Year: Never true  Transportation Needs: No Transportation Needs  . Lack of Transportation (Medical): No  . Lack of Transportation (Non-Medical): No  Physical Activity:   . Days of Exercise per Week: Not on file  . Minutes of Exercise per Session: Not on file  Stress: Stress Concern Present  . Feeling of Stress : Very much  Social Connections: Socially Integrated  . Frequency of Communication with Friends and Family: More than three times a week  . Frequency of Social Gatherings with Friends and Family: Twice a week  . Attends Religious Services: More than 4 times per year  . Active Member of Clubs or Organizations: Yes  . Attends Banker Meetings: 1 to 4 times per year  . Marital Status: Married  Catering manager Violence: Not At Risk  . Fear of Current or Ex-Partner: No  . Emotionally Abused: No  . Physically Abused: No  . Sexually Abused: No   Allergies  Allergen Reactions  . Imitrex [Sumatriptan] Other (See Comments)    Could not speak, worsened headache  . Penicillins Hives  . Ranitidine Hcl Hives  . Sulfa Antibiotics Hives  . Tramadol Hcl Hives  . Amlodipine Palpitations  . Topiramate Palpitations       Medications: Outpatient Medications Prior to Visit  Medication Sig  . aspirin 81 MG tablet Take 1 tablet by mouth daily.  . cetirizine (ZYRTEC) 10 MG tablet Take 10 mg by mouth daily.  . Cholecalciferol 1000 UNITS capsule Take 1 capsule by mouth 3 (three) times daily.  . fluticasone (FLONASE) 50  MCG/ACT nasal spray Place 2 sprays into both nostrils daily.  . naproxen (NAPROSYN) 500 MG tablet Take 1 tablet (500 mg total) by mouth 2 (two) times daily as needed for moderate pain.  . Omega-3 Fatty Acids (FISH OIL) 1200 MG CAPS Take by mouth.  . rosuvastatin (CRESTOR) 5 MG tablet Take 1 tablet (5 mg total) by mouth daily.  . valACYclovir (VALTREX) 1000 MG tablet TAKE 1 TABLET BY MOUTH  DAILY  . valsartan-hydrochlorothiazide (DIOVAN-HCT) 320-25 MG tablet TAKE 1 TABLET BY MOUTH  DAILY  . [DISCONTINUED] atenolol (TENORMIN) 25 MG tablet Take 25 mg by mouth daily.  . [DISCONTINUED] atenolol (TENORMIN) 50 MG tablet Take 0.5 tablets (25 mg total) by mouth daily.   No facility-administered medications prior to visit.    Review of Systems  Constitutional: Negative.   Respiratory: Negative.   Cardiovascular: Negative.   Genitourinary: Negative.  Neurological: Negative.   Psychiatric/Behavioral: Negative.       Objective    BP 130/79 (BP Location: Right Arm, Patient Position: Sitting, Cuff Size: Large)   Pulse 70   Temp 98.2 F (36.8 C) (Oral)   Resp 16   Wt 253 lb (114.8 kg)   BMI 42.10 kg/m  BP Readings from Last 3 Encounters:  11/09/20 130/79  08/31/20 126/78  08/21/20 (!) 142/79   Wt Readings from Last 3 Encounters:  11/09/20 253 lb (114.8 kg)  08/31/20 250 lb 9.6 oz (113.7 kg)  08/21/20 251 lb (113.9 kg)      Physical Exam Vitals reviewed.  Constitutional:      General: She is not in acute distress.    Appearance: Normal appearance. She is well-developed. She is not diaphoretic.  HENT:     Head: Normocephalic and atraumatic.  Eyes:     General: No scleral icterus.    Conjunctiva/sclera: Conjunctivae normal.  Neck:     Thyroid: No thyromegaly.  Cardiovascular:     Rate and Rhythm: Normal rate and regular rhythm.     Pulses: Normal pulses.     Heart sounds: Normal heart sounds. No murmur heard.   Pulmonary:     Effort: Pulmonary effort is normal. No  respiratory distress.     Breath sounds: Normal breath sounds. No wheezing, rhonchi or rales.  Musculoskeletal:     Cervical back: Neck supple.     Right lower leg: No edema.     Left lower leg: No edema.  Lymphadenopathy:     Cervical: No cervical adenopathy.  Skin:    General: Skin is warm and dry.     Findings: No rash.  Neurological:     Mental Status: She is alert and oriented to person, place, and time. Mental status is at baseline.  Psychiatric:        Mood and Affect: Mood normal.        Behavior: Behavior normal.      Results for orders placed or performed in visit on 11/09/20  POCT glycosylated hemoglobin (Hb A1C)  Result Value Ref Range   Hemoglobin A1C 6.5 (A) 4.0 - 5.6 %   Est. average glucose Bld gHb Est-mCnc 140     Assessment & Plan     Problem List Items Addressed This Visit      Cardiovascular and Mediastinum   Hypertension associated with diabetes (HCC)    Well controlled Continue current medications Reviewed recent metabolic panel F/u in 6 months       Relevant Medications   atenolol (TENORMIN) 25 MG tablet     Endocrine   T2DM (type 2 diabetes mellitus) (HCC) - Primary    Well controlled with A1c 6.5 Continue diet control UTD on vaccines, eye exam (will send ROI) , foot exam On ARB On Statin Discussed diet and exercise F/u in 6 months      Relevant Orders   POCT glycosylated hemoglobin (Hb A1C) (Completed)   Hyperlipidemia associated with type 2 diabetes mellitus (HCC)    Previously well controlled Continue statin Goal LDL < 70         Other   Morbid obesity due to excess calories (HCC)    Discussed importance of healthy weight management Discussed diet and exercise  Discussed intermittent fasting      BMI 40.0-44.9, adult (HCC)    Discussed importance of healthy weight management Discussed diet and exercise           Return  in about 6 months (around 05/09/2021) for CPE.      I, Shirlee Latch, MD, have reviewed all  documentation for this visit. The documentation on 11/09/20 for the exam, diagnosis, procedures, and orders are all accurate and complete.   Franceska Strahm, Marzella Schlein, MD, MPH [facility]

## 2020-11-09 NOTE — Assessment & Plan Note (Signed)
Previously well controlled Continue statin Goal LDL < 70  

## 2020-11-09 NOTE — Patient Instructions (Signed)
[email or web address]://www.[email or web address].org/health/wellness-[email or web address]-prevention/[email or web address]-fasting-what-is-it-and-how-does-it-work 

## 2020-11-09 NOTE — Assessment & Plan Note (Addendum)
Well controlled Continue current medications Reviewed recent metabolic panel F/u in 6 months  

## 2020-11-09 NOTE — Assessment & Plan Note (Signed)
Well controlled with A1c 6.5 Continue diet control UTD on vaccines, eye exam (will send ROI) , foot exam On ARB On Statin Discussed diet and exercise F/u in 6 months

## 2020-12-16 ENCOUNTER — Other Ambulatory Visit: Payer: Self-pay

## 2020-12-16 ENCOUNTER — Emergency Department
Admission: EM | Admit: 2020-12-16 | Discharge: 2020-12-16 | Disposition: A | Discharge status: Home / Self Care | Payer: Managed Care, Other (non HMO) | Attending: Emergency Medicine | Admitting: Emergency Medicine

## 2020-12-16 DIAGNOSIS — H5789 Other specified disorders of eye and adnexa: Secondary | ICD-10-CM | POA: Diagnosis present

## 2020-12-16 DIAGNOSIS — H1031 Unspecified acute conjunctivitis, right eye: Secondary | ICD-10-CM | POA: Insufficient documentation

## 2020-12-16 DIAGNOSIS — Z79899 Other long term (current) drug therapy: Secondary | ICD-10-CM | POA: Insufficient documentation

## 2020-12-16 DIAGNOSIS — Z7982 Long term (current) use of aspirin: Secondary | ICD-10-CM | POA: Insufficient documentation

## 2020-12-16 DIAGNOSIS — I10 Essential (primary) hypertension: Secondary | ICD-10-CM | POA: Diagnosis not present

## 2020-12-16 DIAGNOSIS — Z8673 Personal history of transient ischemic attack (TIA), and cerebral infarction without residual deficits: Secondary | ICD-10-CM | POA: Diagnosis not present

## 2020-12-16 DIAGNOSIS — E119 Type 2 diabetes mellitus without complications: Secondary | ICD-10-CM | POA: Diagnosis not present

## 2020-12-16 NOTE — Discharge Instructions (Signed)
You may apply rewetting eye drops as needed to right eye.  I suggest keeping eye drops away from your other medicines to avoid mishap. Avoid Visine brand eyedrops.  Return to the ER for worsening symptoms, persistent vomiting, difficulty breathing or other concerns

## 2020-12-16 NOTE — ED Triage Notes (Signed)
Patient reports accidentally put topical drops for alopecia into her right eye instead of drops for dry eyes because she was half asleep.  Patient reports she wash her eye out as best she could at home.  Patient reports burning to eye and feels like something is in her eye and its cloudy.  Right eye with redness noted.

## 2020-12-16 NOTE — ED Notes (Signed)
No peripheral IV placed this visit.   Discharge instructions reviewed with patient. Questions fielded by this RN. Patient verbalizes understanding of instructions. Patient discharged home in stable condition per sung. No acute distress noted at time of discharge.   

## 2020-12-16 NOTE — ED Notes (Signed)
Pt found in hall bed att, reports putting meds for alopecia into right eye (was trying to used eye drops in the middle of the night), pt reports pain and redness to right eye, pt doesn't wear corrective lens
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
## 2021-01-31 ENCOUNTER — Other Ambulatory Visit: Payer: Self-pay | Admitting: Family Medicine

## 2021-01-31 DIAGNOSIS — E7800 Pure hypercholesterolemia, unspecified: Secondary | ICD-10-CM

## 2021-03-11 ENCOUNTER — Other Ambulatory Visit: Payer: Self-pay | Admitting: Family Medicine

## 2021-03-11 DIAGNOSIS — I10 Essential (primary) hypertension: Secondary | ICD-10-CM

## 2021-03-29 ENCOUNTER — Encounter: Payer: Self-pay | Admitting: Family Medicine

## 2021-04-02 NOTE — Telephone Encounter (Signed)
Forms faxed to [employer]. Pt advised. Thanks TNP

## 2021-04-08 ENCOUNTER — Encounter: Payer: Self-pay | Admitting: Family Medicine

## 2021-04-23 ENCOUNTER — Other Ambulatory Visit: Payer: Self-pay | Admitting: Family Medicine

## 2021-04-23 DIAGNOSIS — E7800 Pure hypercholesterolemia, unspecified: Secondary | ICD-10-CM

## 2021-04-23 NOTE — Telephone Encounter (Signed)
Requested Prescriptions  Pending Prescriptions Disp Refills  . rosuvastatin (CRESTOR) 5 MG tablet [Pharmacy Med Name: Rosuvastatin Calcium 5 MG Oral Tablet] 90 tablet 0    Sig: TAKE 1 TABLET BY MOUTH  DAILY     Cardiovascular:  Antilipid - Statins Failed - 04/23/2021  9:25 PM      Failed - Total Cholesterol in normal range and within 360 days    Cholesterol, Total  Date Value Ref Range Status  03/09/2020 147 100 - 199 mg/dL Final         Failed - LDL in normal range and within 360 days    LDL Chol Calc (NIH)  Date Value Ref Range Status  03/09/2020 59 0 - 99 mg/dL Final         Failed - HDL in normal range and within 360 days    HDL  Date Value Ref Range Status  03/09/2020 70 >39 mg/dL Final         Failed - Triglycerides in normal range and within 360 days    Triglycerides  Date Value Ref Range Status  03/09/2020 97 0 - 149 mg/dL Final         Passed - Patient is not pregnant      Passed - Valid encounter within last 12 months    Recent Outpatient Visits          5 months ago Type 2 diabetes mellitus with other specified complication, without long-term current use of insulin [facility])   [facility] Makena, Marzella Schlein, MD   7 months ago Essential hypertension   [facility], Marzella Schlein, MD   8 months ago Acute pain of left shoulder   [facility] Osvaldo Angst M, [place]   9 months ago Left shoulder pain, unspecified chronicity   [facility] Osvaldo Angst M, [place]   9 months ago Hair loss   [facility], Lavella Hammock, [place]      Future Appointments            In 3 weeks Bacigalupo, Marzella Schlein, MD [facility]

## 2021-05-01 ENCOUNTER — Other Ambulatory Visit: Payer: Self-pay | Admitting: Family Medicine

## 2021-05-01 DIAGNOSIS — Z1231 Encounter for screening mammogram for malignant neoplasm of breast: Secondary | ICD-10-CM

## 2021-05-13 ENCOUNTER — Ambulatory Visit
Admission: RE | Admit: 2021-05-13 | Discharge: 2021-05-13 | Disposition: A | Discharge status: Home / Self Care | Payer: Managed Care, Other (non HMO) | Source: Ambulatory Visit | Attending: Family Medicine | Admitting: Family Medicine

## 2021-05-13 ENCOUNTER — Other Ambulatory Visit: Payer: Self-pay

## 2021-05-13 DIAGNOSIS — Z1231 Encounter for screening mammogram for malignant neoplasm of breast: Secondary | ICD-10-CM | POA: Diagnosis present

## 2021-05-17 ENCOUNTER — Other Ambulatory Visit: Payer: Self-pay

## 2021-05-17 ENCOUNTER — Encounter: Payer: Self-pay | Admitting: Family Medicine

## 2021-05-17 ENCOUNTER — Ambulatory Visit (INDEPENDENT_AMBULATORY_CARE_PROVIDER_SITE_OTHER): Payer: Managed Care, Other (non HMO) | Admitting: Family Medicine

## 2021-05-17 VITALS — BP 136/81 | HR 81 | Temp 98.6°F | Resp 16 | Ht 65.0 in | Wt 259.5 lb

## 2021-05-17 DIAGNOSIS — E1169 Type 2 diabetes mellitus with other specified complication: Secondary | ICD-10-CM

## 2021-05-17 DIAGNOSIS — Z6841 Body Mass Index (BMI) 40.0 and over, adult: Secondary | ICD-10-CM

## 2021-05-17 DIAGNOSIS — Z0000 Encounter for general adult medical examination without abnormal findings: Secondary | ICD-10-CM

## 2021-05-17 DIAGNOSIS — E785 Hyperlipidemia, unspecified: Secondary | ICD-10-CM

## 2021-05-17 DIAGNOSIS — E1159 Type 2 diabetes mellitus with other circulatory complications: Secondary | ICD-10-CM

## 2021-05-17 DIAGNOSIS — I152 Hypertension secondary to endocrine disorders: Secondary | ICD-10-CM

## 2021-05-17 NOTE — Assessment & Plan Note (Signed)
Previously well controlled Continue statin Repeat FLP and CMP Goal LDL < 70 

## 2021-05-17 NOTE — Patient Instructions (Signed)
Preventive Care 84-63 Years Old, Female Preventive care refers to lifestyle choices and visits with your health care provider that can promote health and wellness. This includes:  A yearly physical exam. This is also called an annual wellness visit.  Regular dental and eye exams.  Immunizations.  Screening for certain conditions.  Healthy lifestyle choices, such as: ? Eating a healthy diet. ? Getting regular exercise. ? Not using drugs or products that contain nicotine and tobacco. ? Limiting alcohol use. What can I expect for my preventive care visit? Physical exam Your health care provider will check your:  Height and weight. These may be used to calculate your BMI (body mass index). BMI is a measurement that tells if you are at a healthy weight.  Heart rate and blood pressure.  Body temperature.  Skin for abnormal spots. Counseling Your health care provider may ask you questions about your:  Past medical problems.  Family's medical history.  Alcohol, tobacco, and drug use.  Emotional well-being.  Home life and relationship well-being.  Sexual activity.  Diet, exercise, and sleep habits.  Work and work Statistician.  Access to firearms.  Method of birth control.  Menstrual cycle.  Pregnancy history. What immunizations do I need? Vaccines are usually given at various ages, according to a schedule. Your health care provider will recommend vaccines for you based on your age, medical history, and lifestyle or other factors, such as travel or where you work.   What tests do I need? Blood tests  Lipid and cholesterol levels. These may be checked every 5 years, or more often if you are over 3 years old.  Hepatitis C test.  Hepatitis B test. Screening  Lung cancer screening. You may have this screening every year starting at age 73 if you have a 30-pack-year history of smoking and currently smoke or have quit within the past 15 years.  Colorectal cancer  screening. ? All adults should have this screening starting at age 52 and continuing until age 17. ? Your health care provider may recommend screening at age 49 if you are at increased risk. ? You will have tests every 1-10 years, depending on your results and the type of screening test.  Diabetes screening. ? This is done by checking your blood sugar (glucose) after you have not eaten for a while (fasting). ? You may have this done every 1-3 years.  Mammogram. ? This may be done every 1-2 years. ? Talk with your health care provider about when you should start having regular mammograms. This may depend on whether you have a family history of breast cancer.  BRCA-related cancer screening. This may be done if you have a family history of breast, ovarian, tubal, or peritoneal cancers.  Pelvic exam and Pap test. ? This may be done every 3 years starting at age 10. ? Starting at age 11, this may be done every 5 years if you have a Pap test in combination with an HPV test. Other tests  STD (sexually transmitted disease) testing, if you are at risk.  Bone density scan. This is done to screen for osteoporosis. You may have this scan if you are at high risk for osteoporosis. Talk with your health care provider about your test results, treatment options, and if necessary, the need for more tests. Follow these instructions at home: Eating and drinking  Eat a diet that includes fresh fruits and vegetables, whole grains, lean protein, and low-fat dairy products.  Take vitamin and mineral supplements  as recommended by your health care provider.  Do not drink alcohol if: ? Your health care provider tells you not to drink. ? You are pregnant, may be pregnant, or are planning to become pregnant.  If you drink alcohol: ? Limit how much you have to 0-1 drink a day. ? Be aware of how much alcohol is in your drink. In the [place] one drink equals one 12 oz bottle of beer (355 mL), one 5 oz glass of  wine (148 mL), or one 1 oz glass of hard liquor (44 mL).   Lifestyle  Take daily care of your teeth and gums. Brush your teeth every morning and night with fluoride toothpaste. Floss one time each day.  Stay active. Exercise for at least 30 minutes 5 or more days each week.  Do not use any products that contain nicotine or tobacco, such as cigarettes, e-cigarettes, and chewing tobacco. If you need help quitting, ask your health care provider.  Do not use drugs.  If you are sexually active, practice safe sex. Use a condom or other form of protection to prevent STIs (sexually transmitted infections).  If you do not wish to become pregnant, use a form of birth control. If you plan to become pregnant, see your health care provider for a prepregnancy visit.  If told by your health care provider, take low-dose aspirin daily starting at age 50.  Find healthy ways to cope with stress, such as: ? Meditation, yoga, or listening to music. ? Journaling. ? Talking to a trusted person. ? Spending time with friends and family. Safety  Always wear your seat belt while driving or riding in a vehicle.  Do not drive: ? If you have been drinking alcohol. Do not ride with someone who has been drinking. ? When you are tired or distracted. ? While texting.  Wear a helmet and other protective equipment during sports activities.  If you have firearms in your house, make sure you follow all gun safety procedures. What's next?  Visit your health care provider once a year for an annual wellness visit.  Ask your health care provider how often you should have your eyes and teeth checked.  Stay up to date on all vaccines. This information is not intended to replace advice given to you by your health care provider. Make sure you discuss any questions you have with your health care provider. Document Revised: 09/18/2020 Document Reviewed: 08/26/2018 Elsevier Patient Education  2021 Elsevier Inc.   

## 2021-05-17 NOTE — Assessment & Plan Note (Signed)
Well controlled Continue current medications Recheck metabolic panel F/u in 6 months  

## 2021-05-17 NOTE — Assessment & Plan Note (Signed)
Well controlled with last A1c 6.5 Recheck A1c Continue diet control UTD on vaccines, eye exam (needs to schedule next one), foot exam (completed today) On ARB On Statin Discussed diet and exercise F/u in 6 months

## 2021-05-17 NOTE — Assessment & Plan Note (Signed)
Discussed importance of healthy weight management Discussed diet and exercise  
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
## 2021-05-18 LAB — COMPREHENSIVE METABOLIC PANEL
ALT: 16 IU/L (ref 0–32)
AST: 13 IU/L (ref 0–40)
Albumin/Globulin Ratio: 1.6 (ref 1.2–2.2)
Albumin: 4.1 g/dL (ref 3.8–4.8)
Alkaline Phosphatase: 65 IU/L (ref 44–121)
BUN/Creatinine Ratio: 20 (ref 12–28)
BUN: 19 mg/dL (ref 8–27)
Bilirubin Total: <0.2 mg/dL (ref 0.0–1.2)
CO2: 23 mmol/L (ref 20–29)
Calcium: 9.5 mg/dL (ref 8.7–10.3)
Chloride: 102 mmol/L (ref 96–106)
Creatinine, Ser: 0.93 mg/dL (ref 0.57–1.00)
Globulin, Total: 2.5 g/dL (ref 1.5–4.5)
Glucose: 93 mg/dL (ref 65–99)
Potassium: 4.1 mmol/L (ref 3.5–5.2)
Sodium: 141 mmol/L (ref 134–144)
Total Protein: 6.6 g/dL (ref 6.0–8.5)
eGFR: 69 mL/min/{1.73_m2} (ref >59)

## 2021-05-18 LAB — HEMOGLOBIN A1C
Est. average glucose Bld gHb Est-mCnc: 146 mg/dL
Hgb A1c MFr Bld: 6.7 % — ABNORMAL HIGH (ref 4.8–5.6)

## 2021-05-18 LAB — LIPID PANEL
Chol/HDL Ratio: 2.6 ratio (ref 0.0–4.4)
Cholesterol, Total: 160 mg/dL (ref 100–199)
HDL: 61 mg/dL (ref >39)
LDL Chol Calc (NIH): 79 mg/dL (ref 0–99)
Triglycerides: 111 mg/dL (ref 0–149)
VLDL Cholesterol Cal: 20 mg/dL (ref 5–40)

## 2021-05-21 ENCOUNTER — Encounter: Payer: Self-pay | Admitting: Family Medicine

## 2021-06-04 ENCOUNTER — Encounter: Payer: Self-pay | Admitting: Family Medicine

## 2021-06-04 ENCOUNTER — Other Ambulatory Visit: Payer: Self-pay | Admitting: Family Medicine

## 2021-06-04 DIAGNOSIS — I10 Essential (primary) hypertension: Secondary | ICD-10-CM

## 2021-06-04 NOTE — Telephone Encounter (Signed)
Requested Prescriptions  Pending Prescriptions Disp Refills  . valsartan-hydrochlorothiazide (DIOVAN-HCT) 320-25 MG tablet [Pharmacy Med Name: Valsartan-hydroCHLOROthiazide 320-25 MG Oral Tablet] 90 tablet 1    Sig: TAKE 1 TABLET BY MOUTH  DAILY     Cardiovascular: ARB + Diuretic Combos Passed - 06/04/2021  9:26 PM      Passed - K in normal range and within 180 days    Potassium  Date Value Ref Range Status  05/17/2021 4.1 3.5 - 5.2 mmol/L Final  07/25/2014 5.0 3.5 - 5.1 mmol/L Final         Passed - Na in normal range and within 180 days    Sodium  Date Value Ref Range Status  05/17/2021 141 134 - 144 mmol/L Final  07/25/2014 140 136 - 145 mmol/L Final         Passed - Cr in normal range and within 180 days    Creatinine  Date Value Ref Range Status  07/25/2014 0.59 (L) 0.60 - 1.30 mg/dL Final   Creatinine, Ser  Date Value Ref Range Status  05/17/2021 0.93 0.57 - 1.00 mg/dL Final         Passed - Ca in normal range and within 180 days    Calcium  Date Value Ref Range Status  05/17/2021 9.5 8.7 - 10.3 mg/dL Final   Calcium, Total  Date Value Ref Range Status  07/25/2014 9.2 8.5 - 10.1 mg/dL Final         Passed - Patient is not pregnant      Passed - Last BP in normal range    BP Readings from Last 1 Encounters:  05/17/21 136/81         Passed - Valid encounter within last 6 months    Recent Outpatient Visits          2 weeks ago Annual physical exam   [facility] Stout, Marzella Schlein, MD   6 months ago Type 2 diabetes mellitus with other specified complication, without long-term current use of insulin [facility])   [facility] Young Harris, Marzella Schlein, MD   9 months ago Essential hypertension   [facility] Erasmo Downer, MD   9 months ago Acute pain of left shoulder   [facility] Osvaldo Angst M, [place]   10 months ago Left shoulder pain, unspecified chronicity   [facility],  Lavella Hammock, [place]      Future Appointments            In 5 months Bacigalupo, Marzella Schlein, MD [facility], [facility]

## 2021-06-19 ENCOUNTER — Telehealth: Payer: Self-pay

## 2021-06-19 NOTE — Telephone Encounter (Signed)
Copied from CRM [id number]. Topic: General - Other >> Jun 19, 2021 11:18 AM Elliot Gault wrote: Reason for CRM: Patient would received a my chart message from the nurse advising her the following wellness screening form was missing patient waist measurements and signature. Patient will stop in tomorrow 06/20/2021 between the hours of 4 and 5, please have the form at the front desk.

## 2021-06-21 NOTE — Telephone Encounter (Signed)
Patient came in today, wait measurement done and health screen form faxed.

## 2021-07-18 ENCOUNTER — Other Ambulatory Visit: Payer: Self-pay | Admitting: Family Medicine

## 2021-07-18 DIAGNOSIS — E7800 Pure hypercholesterolemia, unspecified: Secondary | ICD-10-CM

## 2021-07-18 NOTE — Telephone Encounter (Signed)
Requested Prescriptions  Pending Prescriptions Disp Refills  . rosuvastatin (CRESTOR) 5 MG tablet [Pharmacy Med Name: Rosuvastatin Calcium 5 MG Oral Tablet] 90 tablet 3    Sig: TAKE 1 TABLET BY MOUTH  DAILY     Cardiovascular:  Antilipid - Statins Passed - 07/18/2021 12:59 AM      Passed - Total Cholesterol in normal range and within 360 days    Cholesterol, Total  Date Value Ref Range Status  05/17/2021 160 100 - 199 mg/dL Final         Passed - LDL in normal range and within 360 days    LDL Chol Calc (NIH)  Date Value Ref Range Status  05/17/2021 79 0 - 99 mg/dL Final         Passed - HDL in normal range and within 360 days    HDL  Date Value Ref Range Status  05/17/2021 61 >39 mg/dL Final         Passed - Triglycerides in normal range and within 360 days    Triglycerides  Date Value Ref Range Status  05/17/2021 111 0 - 149 mg/dL Final         Passed - Patient is not pregnant      Passed - Valid encounter within last 12 months    Recent Outpatient Visits          2 months ago Annual physical exam   [facility] [place], Marzella Schlein, MD   8 months ago Type 2 diabetes mellitus with other specified complication, without long-term current use of insulin [facility] [place])   [facility] [place], Marzella Schlein, MD   10 months ago Essential hypertension   [facility] [place], Marzella Schlein, MD   11 months ago Acute pain of left shoulder   [facility] Osvaldo Angst M, [place]   11 months ago Left shoulder pain, unspecified chronicity   [facility] [place], Lavella Hammock, [place]      Future Appointments            In 4 months Bacigalupo, Marzella Schlein, MD [facility]

## 2021-08-29 ENCOUNTER — Other Ambulatory Visit: Payer: Self-pay | Admitting: Family Medicine

## 2021-08-29 ENCOUNTER — Other Ambulatory Visit: Payer: Self-pay | Admitting: Physician Assistant

## 2021-08-29 DIAGNOSIS — E7800 Pure hypercholesterolemia, unspecified: Secondary | ICD-10-CM

## 2021-08-29 NOTE — Telephone Encounter (Signed)
Future visit in 3 months  

## 2021-09-12 ENCOUNTER — Encounter: Payer: Self-pay | Admitting: Family Medicine

## 2021-09-12 NOTE — Telephone Encounter (Signed)
I am not in the office currently and will be out next week. Please copy and update info from last FMLA forms and have another provider sign.

## 2021-09-16 ENCOUNTER — Telehealth: Payer: Self-pay

## 2021-09-16 NOTE — Telephone Encounter (Signed)
Pt advised forms were completed by Robynn Pane dut to Dr. B being out of the office. Faxed forms to [email or web address] after pt requested. Pt advised a copy has been placed up front for her to pick up. TNP

## 2021-09-18 ENCOUNTER — Encounter: Payer: Self-pay | Admitting: Family Medicine

## 2021-09-19 NOTE — Telephone Encounter (Signed)
Please review for Dr. B ° ° °Thanks,  ° °-Bridey Brookover  °

## 2021-10-17 ENCOUNTER — Emergency Department: Payer: Managed Care, Other (non HMO)

## 2021-10-17 ENCOUNTER — Ambulatory Visit: Payer: Self-pay | Admitting: *Deleted

## 2021-10-17 ENCOUNTER — Emergency Department
Admission: EM | Admit: 2021-10-17 | Discharge: 2021-10-17 | Disposition: A | Discharge status: Home / Self Care | Payer: Managed Care, Other (non HMO) | Attending: Emergency Medicine | Admitting: Emergency Medicine

## 2021-10-17 ENCOUNTER — Other Ambulatory Visit: Payer: Self-pay

## 2021-10-17 DIAGNOSIS — R42 Dizziness and giddiness: Secondary | ICD-10-CM | POA: Diagnosis present

## 2021-10-17 DIAGNOSIS — Z79899 Other long term (current) drug therapy: Secondary | ICD-10-CM | POA: Insufficient documentation

## 2021-10-17 DIAGNOSIS — H81399 Other peripheral vertigo, unspecified ear: Secondary | ICD-10-CM | POA: Diagnosis not present

## 2021-10-17 DIAGNOSIS — R110 Nausea: Secondary | ICD-10-CM

## 2021-10-17 DIAGNOSIS — E119 Type 2 diabetes mellitus without complications: Secondary | ICD-10-CM | POA: Insufficient documentation

## 2021-10-17 DIAGNOSIS — Z7982 Long term (current) use of aspirin: Secondary | ICD-10-CM | POA: Insufficient documentation

## 2021-10-17 DIAGNOSIS — I10 Essential (primary) hypertension: Secondary | ICD-10-CM | POA: Insufficient documentation

## 2021-10-17 LAB — CBC
HCT: 38.5 % (ref 36.0–46.0)
Hemoglobin: 12.6 g/dL (ref 12.0–15.0)
MCH: 30.4 pg (ref 26.0–34.0)
MCHC: 32.7 g/dL (ref 30.0–36.0)
MCV: 93 fL (ref 80.0–100.0)
Platelets: 267 10*3/uL (ref 150–400)
RBC: 4.14 MIL/uL (ref 3.87–5.11)
RDW: 14.7 % (ref 11.5–15.5)
WBC: 9.1 10*3/uL (ref 4.0–10.5)
nRBC: 0 % (ref 0.0–0.2)

## 2021-10-17 LAB — URINALYSIS, ROUTINE W REFLEX MICROSCOPIC
Bilirubin Urine: NEGATIVE
Glucose, UA: NEGATIVE mg/dL
Hgb urine dipstick: NEGATIVE
Ketones, ur: NEGATIVE mg/dL
Leukocytes,Ua: NEGATIVE
Nitrite: NEGATIVE
Protein, ur: NEGATIVE mg/dL
Specific Gravity, Urine: 1.018 (ref 1.005–1.030)
pH: 5 (ref 5.0–8.0)

## 2021-10-17 LAB — BASIC METABOLIC PANEL
Anion gap: 10 (ref 5–15)
BUN: 39 mg/dL — ABNORMAL HIGH (ref 8–23)
CO2: 27 mmol/L (ref 22–32)
Calcium: 9.5 mg/dL (ref 8.9–10.3)
Chloride: 99 mmol/L (ref 98–111)
Creatinine, Ser: 0.88 mg/dL (ref 0.44–1.00)
GFR, Estimated: >60 mL/min (ref >60)
Glucose, Bld: 115 mg/dL — ABNORMAL HIGH (ref 70–99)
Potassium: 4.3 mmol/L (ref 3.5–5.1)
Sodium: 136 mmol/L (ref 135–145)

## 2021-10-17 LAB — HEPATIC FUNCTION PANEL
ALT: 25 U/L (ref 0–44)
AST: 19 U/L (ref 15–41)
Albumin: 3.8 g/dL (ref 3.5–5.0)
Alkaline Phosphatase: 53 U/L (ref 38–126)
Bilirubin, Direct: <0.1 mg/dL (ref 0.0–0.2)
Total Bilirubin: 0.6 mg/dL (ref 0.3–1.2)
Total Protein: 6.9 g/dL (ref 6.5–8.1)

## 2021-10-17 LAB — TROPONIN I (HIGH SENSITIVITY): Troponin I (High Sensitivity): 3 ng/L (ref <18)

## 2021-10-17 MED ORDER — MECLIZINE HCL 25 MG PO TABS: 25.0000 mg | ORAL_TABLET | Freq: Three times a day (TID) | ORAL | 0 refills | Status: DC | PRN

## 2021-10-17 MED ORDER — ONDANSETRON 4 MG PO TBDP: 4.0000 mg | ORAL_TABLET | Freq: Three times a day (TID) | ORAL | 0 refills | Status: DC | PRN

## 2021-10-17 MED ORDER — MECLIZINE HCL 25 MG PO TABS
25.0000 mg | ORAL_TABLET | Freq: Once | ORAL | Status: AC
  Administered 2021-10-17: 25 mg via ORAL
  Filled 2021-10-17: qty 1
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
## 2021-10-17 NOTE — Telephone Encounter (Signed)
Patient is calling to report severe dizziness has to hold on to walk. Patient has never been evaluated for this before. Advised due to no previous diagnosis- ED for evaluation
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
## 2021-10-17 NOTE — ED Triage Notes (Signed)
Pt here with dizziness that started Sat. Pt states the dizziness has been increasing over the past few days. Pt also states some abd pain. Pt in NAD in triage. Pt denies N/V/D. Pt has hx of migraines.

## 2021-10-17 NOTE — Telephone Encounter (Signed)
Noted  

## 2021-10-17 NOTE — Telephone Encounter (Signed)
Dizziness- started Saturday- has gotten worse throughout the week. Reason for Disposition  [1] Dizziness (vertigo) present now AND [2] one or more STROKE RISK FACTORS (i.e., hypertension, diabetes, prior stroke/TIA, heart attack)  (Exception: prior physician evaluation for this AND no different/worse than usual)  Answer Assessment - Initial Assessment Questions 1. DESCRIPTION: "Describe your dizziness."     Off balance 2. LIGHTHEADED: "Do you feel lightheaded?" (e.g., somewhat faint, woozy, weak upon standing)     Just off balance 3. VERTIGO: "Do you feel like either you or the room is spinning or tilting?" (i.e. vertigo)     Today- yes 4. SEVERITY: "How bad is it?"  "Do you feel like you are going to faint?" "Can you stand and walk?"   - MILD: Feels slightly dizzy, but walking normally.   - MODERATE: Feels unsteady when walking, but not falling; interferes with normal activities (e.g., school, work).   - SEVERE: Unable to walk without falling, or requires assistance to walk without falling; feels like passing out now.      Moderate/severe 5. ONSET:  "When did the dizziness begin?"     Saturday- progressed 6. AGGRAVATING FACTORS: "Does anything make it worse?" (e.g., standing, change in head position)     No- occurs at any time 7. HEART RATE: "Can you tell me your heart rate?" "How many beats in 15 seconds?"  (Note: not all patients can do this)       Not checked 8. CAUSE: "What do you think is causing the dizziness?"     unsure 9. RECURRENT SYMPTOM: "Have you had dizziness before?" If Yes, ask: "When was the last time?" "What happened that time?"     Patient has migraines-but different 10. OTHER SYMPTOMS: "Do you have any other symptoms?" (e.g., fever, chest pain, vomiting, diarrhea, bleeding)       Gets nausea 11. PREGNANCY: "Is there any chance you are pregnant?" "When was your last menstrual period?"       N/a  Protocols used: Dizziness - Lightheadedness-A-AH, Dizziness -  Vertigo-A-AH

## 2021-10-28 ENCOUNTER — Encounter: Payer: Self-pay | Admitting: Family Medicine

## 2021-10-28 NOTE — Telephone Encounter (Signed)
Please write a letter stating that the patient does not smoke and has never smoked and I will sign it. Thanks!

## 2021-10-30 NOTE — Progress Notes (Signed)
Pt called to check on letter and to see if it can be emailed or placed in her mychart / letter Is due asap / please advise

## 2021-11-19 ENCOUNTER — Other Ambulatory Visit: Payer: Self-pay | Admitting: Family Medicine

## 2021-11-19 DIAGNOSIS — I10 Essential (primary) hypertension: Secondary | ICD-10-CM

## 2021-11-19 NOTE — Telephone Encounter (Signed)
Requested Prescriptions  Pending Prescriptions Disp Refills  . valsartan-hydrochlorothiazide (DIOVAN-HCT) 320-25 MG tablet [Pharmacy Med Name: Valsartan-hydroCHLOROthiazide 320-25 MG Oral Tablet] 90 tablet 0    Sig: TAKE 1 TABLET BY MOUTH  DAILY     Cardiovascular: ARB + Diuretic Combos Failed - 11/19/2021  4:51 AM      Failed - Valid encounter within last 6 months    Recent Outpatient Visits          6 months ago Annual physical exam   [facility] [place], Marzella Schlein, MD   1 year ago Type 2 diabetes mellitus with other specified complication, without long-term current use of insulin (HCC)   [facility] [place], Marzella Schlein, MD   1 year ago Essential hypertension   [facility] [place], Marzella Schlein, MD   1 year ago Acute pain of left shoulder   [facility] Osvaldo Angst M, [place]   1 year ago Left shoulder pain, unspecified chronicity   [facility] [place], Lavella Hammock, [place]      Future Appointments            In 1 week Bacigalupo, Marzella Schlein, MD [facility], [facility]           Passed - K in normal range and within 180 days    Potassium  Date Value Ref Range Status  10/17/2021 4.3 3.5 - 5.1 mmol/L Final  07/25/2014 5.0 3.5 - 5.1 mmol/L Final         Passed - Na in normal range and within 180 days    Sodium  Date Value Ref Range Status  10/17/2021 136 135 - 145 mmol/L Final  05/17/2021 141 134 - 144 mmol/L Final  07/25/2014 140 136 - 145 mmol/L Final         Passed - Cr in normal range and within 180 days    Creatinine  Date Value Ref Range Status  07/25/2014 0.59 (L) 0.60 - 1.30 mg/dL Final   Creatinine, Ser  Date Value Ref Range Status  10/17/2021 0.88 0.44 - 1.00 mg/dL Final         Passed - Ca in normal range and within 180 days    Calcium  Date Value Ref Range Status  10/17/2021 9.5 8.9 - 10.3 mg/dL Final   Calcium, Total  Date Value Ref Range Status  07/25/2014 9.2 8.5 -  10.1 mg/dL Final         Passed - Patient is not pregnant      Passed - Last BP in normal range    BP Readings from Last 1 Encounters:  10/17/21 139/63

## 2021-11-20 ENCOUNTER — Other Ambulatory Visit: Payer: Self-pay | Admitting: Family Medicine

## 2021-11-20 NOTE — Telephone Encounter (Signed)
Requested Prescriptions  Pending Prescriptions Disp Refills  . atenolol (TENORMIN) 25 MG tablet [Pharmacy Med Name: Atenolol 25 MG Oral Tablet] 90 tablet 0    Sig: TAKE 1 TABLET BY MOUTH  DAILY     Cardiovascular:  Beta Blockers Failed - 11/20/2021 11:33 PM      Failed - Valid encounter within last 6 months    Recent Outpatient Visits          6 months ago Annual physical exam   [facility] [place], Marzella Schlein, MD   1 year ago Type 2 diabetes mellitus with other specified complication, without long-term current use of insulin [facility])   [facility], Marzella Schlein, MD   1 year ago Essential hypertension   [facility] [place], Marzella Schlein, MD   1 year ago Acute pain of left shoulder   [facility] Osvaldo Angst M, [place]   1 year ago Left shoulder pain, unspecified chronicity   [facility] [place], Lavella Hammock, [place]      Future Appointments            In 1 week Bacigalupo, Marzella Schlein, MD [facility], [facility]           Passed - Last BP in normal range    BP Readings from Last 1 Encounters:  10/17/21 139/63         Passed - Last Heart Rate in normal range    Pulse Readings from Last 1 Encounters:  10/17/21 68         . valACYclovir (VALTREX) 1000 MG tablet [Pharmacy Med Name: valACYclovir HCl 1 GM Oral Tablet] 90 tablet 0    Sig: TAKE 1 TABLET BY MOUTH  DAILY     Antimicrobials:  Antiviral Agents - Anti-Herpetic Passed - 11/20/2021 11:33 PM      Passed - Valid encounter within last 12 months    Recent Outpatient Visits          6 months ago Annual physical exam   [facility] Erasmo Downer, [facility]   1 year ago Type 2 diabetes mellitus with other specified complication, without long-term current use of insulin [facility])   [facility] [place], Marzella Schlein, MD   1 year ago Essential hypertension   [facility] [place], Marzella Schlein, MD   1 year  ago Acute pain of left shoulder   [facility] Osvaldo Angst M, [place]   1 year ago Left shoulder pain, unspecified chronicity   [facility] [place], Lavella Hammock, [place]      Future Appointments            In 1 week Bacigalupo, Marzella Schlein, MD [facility], [facility]

## 2021-11-26 ENCOUNTER — Ambulatory Visit: Payer: Managed Care, Other (non HMO) | Admitting: Family Medicine

## 2021-11-29 ENCOUNTER — Other Ambulatory Visit: Payer: Self-pay

## 2021-11-29 ENCOUNTER — Encounter: Payer: Self-pay | Admitting: Family Medicine

## 2021-11-29 ENCOUNTER — Ambulatory Visit: Payer: Managed Care, Other (non HMO) | Admitting: Family Medicine

## 2021-11-29 ENCOUNTER — Ambulatory Visit: Payer: Self-pay | Admitting: Family Medicine

## 2021-11-29 VITALS — BP 132/73 | HR 67 | Temp 98.6°F | Resp 16 | Ht 65.0 in | Wt 251.2 lb

## 2021-11-29 DIAGNOSIS — I152 Hypertension secondary to endocrine disorders: Secondary | ICD-10-CM

## 2021-11-29 DIAGNOSIS — E1169 Type 2 diabetes mellitus with other specified complication: Secondary | ICD-10-CM

## 2021-11-29 DIAGNOSIS — E1159 Type 2 diabetes mellitus with other circulatory complications: Secondary | ICD-10-CM | POA: Diagnosis not present

## 2021-11-29 DIAGNOSIS — Z23 Encounter for immunization: Secondary | ICD-10-CM

## 2021-11-29 DIAGNOSIS — E785 Hyperlipidemia, unspecified: Secondary | ICD-10-CM

## 2021-11-29 LAB — POCT GLYCOSYLATED HEMOGLOBIN (HGB A1C)
Est. average glucose Bld gHb Est-mCnc: 128
Hemoglobin A1C: 6.1 % — AB (ref 4.0–5.6)

## 2021-11-29 NOTE — Assessment & Plan Note (Signed)
Previously well controlled Continue statin Repeat FLP and CMP Goal LDL < 70 

## 2021-11-29 NOTE — Progress Notes (Signed)
Established patient visit   Patient: Angela Hodges   DOB: 1956-12-10   65 y.o. Female  MRN: [id number] Visit Date: 11/29/2021  Today's healthcare provider: Shirlee Latch, MD   Chief Complaint  Patient presents with   Follow-up    HTN,DM, Cholesterol   Subjective    HPI HPI     Follow-up    Additional comments: HTN,DM, Cholesterol      Last edited by Marjie Skiff, CMA on 11/29/2021 10:58 AM.      Diabetes Mellitus Type II, follow-up  Lab Results  Component Value Date   HGBA1C 6.1 (A) 11/29/2021   HGBA1C 6.7 (H) 05/17/2021   HGBA1C 6.5 (A) 11/09/2020   Last seen for diabetes 6 months ago.  Management since then includes continuing eating healthy and exercising.  Home blood sugar records: fasting range: 90's-110's  Episodes of hypoglycemia? No    Most Recent Eye Exam: UTD  --------------------------------------------------------------------------------------------------- Hypertension, follow-up  BP Readings from Last 3 Encounters:  11/29/21 132/73  10/17/21 139/63  05/17/21 136/81   Wt Readings from Last 3 Encounters:  11/29/21 251 lb 3.2 oz (113.9 kg)  10/17/21 251 lb (113.9 kg)  05/17/21 259 lb 8 oz (117.7 kg)     She was last seen for hypertension 6 months ago.  BP at that visit was 136/81. Management since that visit includes continue current medications. She reports good compliance with treatment. She is not having side effects.  She is exercising. She is adherent to low salt diet.   Outside blood pressures are 140's/80's the highest.  --------------------------------------------------------------------------------------------------- Lipid/Cholesterol, follow-up  Last Lipid Panel: Lab Results  Component Value Date   CHOL 160 05/17/2021   LDLCALC 79 05/17/2021   HDL 61 05/17/2021   TRIG 111 05/17/2021    She was last seen for this 6 months ago.  Management since that visit includes continue statin.  She reports  excellent compliance with treatment. She is not having side effects.   Symptoms: No appetite changes No foot ulcerations  No chest pain No chest pressure/discomfort  No dyspnea No orthopnea  No fatigue No lower extremity edema  No palpitations No paroxysmal nocturnal dyspnea  No nausea No numbness or tingling of extremity  No polydipsia No polyuria  No speech difficulty No syncope     Last metabolic panel Lab Results  Component Value Date   GLUCOSE 115 (H) 10/17/2021   NA 136 10/17/2021   K 4.3 10/17/2021   BUN 39 (H) 10/17/2021   CREATININE 0.88 10/17/2021   EGFR 69 05/17/2021   GFRNONAA >60 10/17/2021   CALCIUM 9.5 10/17/2021   AST 19 10/17/2021   ALT 25 10/17/2021   The 10-year ASCVD risk score (Arnett DK, et al., 2019) is: 15.8%  ---------------------------------------------------------------------------------------------------     Medications: Outpatient Medications Prior to Visit  Medication Sig   aspirin 81 MG tablet Take 1 tablet by mouth daily.   atenolol (TENORMIN) 25 MG tablet TAKE 1 TABLET BY MOUTH  DAILY   Cholecalciferol 1000 UNITS capsule Take 1 capsule by mouth 3 (three) times daily.   fluticasone (FLONASE) 50 MCG/ACT nasal spray Place 2 sprays into both nostrils daily.   meclizine (ANTIVERT) 25 MG tablet Take 1 tablet (25 mg total) by mouth 3 (three) times daily as needed for dizziness.   Omega-3 Fatty Acids (FISH OIL) 1200 MG CAPS Take by mouth.   rosuvastatin (CRESTOR) 5 MG tablet TAKE 1 TABLET BY MOUTH  DAILY   valACYclovir (VALTREX) 1000  MG tablet TAKE 1 TABLET BY MOUTH  DAILY   valsartan-hydrochlorothiazide (DIOVAN-HCT) 320-25 MG tablet TAKE 1 TABLET BY MOUTH  DAILY   [DISCONTINUED] cetirizine (ZYRTEC) 10 MG tablet Take 10 mg by mouth daily.   [DISCONTINUED] ondansetron (ZOFRAN ODT) 4 MG disintegrating tablet Take 1 tablet (4 mg total) by mouth every 8 (eight) hours as needed for nausea or vomiting.   No facility-administered medications prior  to visit.    Review of Systems per HPI     Objective    BP 132/73 (BP Location: Left Wrist, Patient Position: Sitting, Cuff Size: Large)   Pulse 67   Temp 98.6 F (37 C) (Oral)   Resp 16   Ht 5\' 5"  (1.651 m)   Wt 251 lb 3.2 oz (113.9 kg)   BMI 41.80 kg/m  {Show previous vital signs (optional):23777}  Physical Exam Vitals reviewed.  Constitutional:      General: She is not in acute distress.    Appearance: Normal appearance. She is well-developed. She is not diaphoretic.  HENT:     Head: Normocephalic and atraumatic.  Eyes:     General: No scleral icterus.    Conjunctiva/sclera: Conjunctivae normal.  Neck:     Thyroid: No thyromegaly.  Cardiovascular:     Rate and Rhythm: Normal rate and regular rhythm.     Pulses: Normal pulses.     Heart sounds: Normal heart sounds. No murmur heard. Pulmonary:     Effort: Pulmonary effort is normal. No respiratory distress.     Breath sounds: Normal breath sounds. No wheezing, rhonchi or rales.  Musculoskeletal:     Cervical back: Neck supple.     Right lower leg: No edema.     Left lower leg: No edema.  Lymphadenopathy:     Cervical: No cervical adenopathy.  Skin:    General: Skin is warm and dry.     Findings: No rash.  Neurological:     Mental Status: She is alert and oriented to person, place, and time. Mental status is at baseline.  Psychiatric:        Mood and Affect: Mood normal.        Behavior: Behavior normal.      Results for orders placed or performed in visit on 11/29/21  POCT glycosylated hemoglobin (Hb A1C)  Result Value Ref Range   Hemoglobin A1C 6.1 (A) 4.0 - 5.6 %   Est. average glucose Bld gHb Est-mCnc 128     Assessment & Plan     Problem List Items Addressed This Visit       Cardiovascular and Mediastinum   Hypertension associated with diabetes (HCC)    Well controlled Continue current medications Recheck metabolic panel F/u in 6 months       Relevant Orders   Comprehensive metabolic  panel     Endocrine   T2DM (type 2 diabetes mellitus) (HCC) - Primary    Well controlled with A1c 6.1 cotninue diet control Continue ARB and statin Upcoming eye exam UTD on other screening      Relevant Orders   POCT glycosylated hemoglobin (Hb A1C) (Completed)   Lipid panel   Hyperlipidemia associated with type 2 diabetes mellitus (HCC)    Previously well controlled Continue statin Repeat FLP and CMP Goal LDL < 70       Relevant Orders   Comprehensive metabolic panel   Lipid panel     Other   Morbid obesity (HCC)    Discussed importance of healthy weight management  Discussed diet and exercise         Return in about 6 months (around 05/30/2022) for CPE.      I, Shirlee Latch, MD, have reviewed all documentation for this visit. The documentation on 11/29/21 for the exam, diagnosis, procedures, and orders are all accurate and complete.   Tamra Koos, Marzella Schlein, MD, MPH [facility]

## 2021-11-29 NOTE — Addendum Note (Signed)
Addended by: Marjie Skiff on: 11/29/2021 11:36 AM   Modules accepted: Orders

## 2021-11-29 NOTE — Assessment & Plan Note (Signed)
Well controlled with A1c 6.1 cotninue diet control Continue ARB and statin Upcoming eye exam UTD on other screening

## 2021-11-29 NOTE — Assessment & Plan Note (Signed)
Well controlled Continue current medications Recheck metabolic panel F/u in 6 months  

## 2021-11-29 NOTE — Assessment & Plan Note (Signed)
Discussed importance of healthy weight management Discussed diet and exercise  

## 2021-11-30 LAB — COMPREHENSIVE METABOLIC PANEL
ALT: 17 IU/L (ref 0–32)
AST: 19 IU/L (ref 0–40)
Albumin/Globulin Ratio: 2.1 (ref 1.2–2.2)
Albumin: 4.6 g/dL (ref 3.8–4.8)
Alkaline Phosphatase: 58 IU/L (ref 44–121)
BUN/Creatinine Ratio: 29 — ABNORMAL HIGH (ref 12–28)
BUN: 24 mg/dL (ref 8–27)
Bilirubin Total: 0.3 mg/dL (ref 0.0–1.2)
CO2: 25 mmol/L (ref 20–29)
Calcium: 9.4 mg/dL (ref 8.7–10.3)
Chloride: 101 mmol/L (ref 96–106)
Creatinine, Ser: 0.82 mg/dL (ref 0.57–1.00)
Globulin, Total: 2.2 g/dL (ref 1.5–4.5)
Glucose: 96 mg/dL (ref 70–99)
Potassium: 4.5 mmol/L (ref 3.5–5.2)
Sodium: 139 mmol/L (ref 134–144)
Total Protein: 6.8 g/dL (ref 6.0–8.5)
eGFR: 80 mL/min/{1.73_m2} (ref >59)

## 2021-11-30 LAB — LIPID PANEL
Chol/HDL Ratio: 2.5 ratio (ref 0.0–4.4)
Cholesterol, Total: 171 mg/dL (ref 100–199)
HDL: 68 mg/dL (ref >39)
LDL Chol Calc (NIH): 84 mg/dL (ref 0–99)
Triglycerides: 109 mg/dL (ref 0–149)
VLDL Cholesterol Cal: 19 mg/dL (ref 5–40)

## 2022-02-02 ENCOUNTER — Other Ambulatory Visit: Payer: Self-pay | Admitting: Family Medicine

## 2022-02-02 DIAGNOSIS — I10 Essential (primary) hypertension: Secondary | ICD-10-CM

## 2022-03-25 ENCOUNTER — Encounter: Payer: Self-pay | Admitting: Family Medicine

## 2022-03-25 NOTE — Telephone Encounter (Signed)
She gets these renewed q74m. Any way that someone can fill in the same information from the last FMLA forms and change the dates and I'll complete and sign afterwards? Thanks! ?

## 2022-04-02 ENCOUNTER — Other Ambulatory Visit: Payer: Self-pay | Admitting: Family Medicine

## 2022-04-02 DIAGNOSIS — Z1231 Encounter for screening mammogram for malignant neoplasm of breast: Secondary | ICD-10-CM

## 2022-04-07 ENCOUNTER — Ambulatory Visit: Payer: Managed Care, Other (non HMO) | Admitting: Family Medicine

## 2022-04-07 ENCOUNTER — Encounter: Payer: Self-pay | Admitting: Family Medicine

## 2022-04-07 VITALS — BP 136/67 | HR 76 | Temp 97.8°F | Resp 16 | Wt 251.0 lb

## 2022-04-07 DIAGNOSIS — M546 Pain in thoracic spine: Secondary | ICD-10-CM | POA: Diagnosis not present

## 2022-04-07 DIAGNOSIS — M5414 Radiculopathy, thoracic region: Secondary | ICD-10-CM

## 2022-04-07 DIAGNOSIS — K5909 Other constipation: Secondary | ICD-10-CM | POA: Diagnosis not present

## 2022-04-07 NOTE — Progress Notes (Signed)
? ?I,Sulibeya S Dimas,acting as a scribe for Shirlee Latch, MD.,have documented all relevant documentation on the behalf of Shirlee Latch, MD,as directed by  Shirlee Latch, MD while in the presence of Shirlee Latch, MD. ?  ? ? ?Established patient visit ? ? ?Patient: Angela Hodges   DOB: [birth year]   66 y.o. Female  MRN: [id number] ?Visit Date: 04/07/2022 ? ?Today's healthcare provider: Shirlee Latch, MD  ? ?Chief Complaint  ?Patient presents with  ? Back Pain  ? ?Subjective  ?  ?Back Pain ?This is a new problem. The current episode started in the past 7 days. The problem occurs intermittently. The problem is unchanged. Pain location: right side radiating to right lower back. The quality of the pain is described as shooting and stabbing. The pain is moderate. The pain is The same all the time. The symptoms are aggravated by bending, twisting, lying down, sitting, position and standing. Stiffness is present All day. Pertinent negatives include no abdominal pain, bladder incontinence, chest pain, dysuria, fever, leg pain, numbness, tingling or weakness. Treatments tried: tylenol and cranberry juice. The treatment provided no relief.   ? ?>1 wk, sharp and stabbing ?No radiation into leg. ?No numbness or weakness ?Hasnt changed bowel/bladder ?No dysuria, hematuria, fever, etc ? ?Has chronic constipation - tried senokot for many years and doesn't seem to be working as well. Hydrating well with water. Staying active.  Has never tried miralax. Metamucil. ?Medications: ?Outpatient Medications Prior to Visit  ?Medication Sig  ? aspirin 81 MG tablet Take 1 tablet by mouth daily.  ? atenolol (TENORMIN) 25 MG tablet TAKE 1 TABLET BY MOUTH DAILY  ? Cholecalciferol 1000 UNITS capsule Take 1 capsule by mouth 3 (three) times daily.  ? fluticasone (FLONASE) 50 MCG/ACT nasal spray Place 2 sprays into both nostrils daily.  ? gabapentin (NEURONTIN) 300 MG capsule Take 300 mg by mouth at bedtime.  ? meclizine  (ANTIVERT) 25 MG tablet Take 1 tablet (25 mg total) by mouth 3 (three) times daily as needed for dizziness.  ? meloxicam (MOBIC) 15 MG tablet Take 15 mg by mouth daily.  ? Omega-3 Fatty Acids (FISH OIL) 1200 MG CAPS Take by mouth.  ? rosuvastatin (CRESTOR) 5 MG tablet TAKE 1 TABLET BY MOUTH  DAILY  ? valACYclovir (VALTREX) 1000 MG tablet TAKE 1 TABLET BY MOUTH DAILY  ? valsartan-hydrochlorothiazide (DIOVAN-HCT) 320-25 MG tablet TAKE 1 TABLET BY MOUTH DAILY  ? ?No facility-administered medications prior to visit.  ? ? ?Review of Systems  ?Constitutional:  Negative for appetite change and fever.  ?Respiratory:  Negative for chest tightness and shortness of breath.   ?Cardiovascular:  Negative for chest pain and palpitations.  ?Gastrointestinal:  Negative for abdominal pain.  ?Genitourinary:  Negative for bladder incontinence and dysuria.  ?Musculoskeletal:  Positive for back pain and myalgias.  ?Neurological:  Negative for tingling, weakness and numbness.  ? ?  ?  Objective  ?  ?BP 136/67 (BP Location: Right Arm, Patient Position: Sitting, Cuff Size: Large)   Pulse 76   Temp 97.8 ?F (36.6 ?C) (Temporal)   Resp 16   Wt 251 lb (113.9 kg)   BMI 41.77 kg/m?  ?BP Readings from Last 3 Encounters:  ?04/07/22 136/67  ?11/29/21 132/73  ?10/17/21 139/63  ? ?Wt Readings from Last 3 Encounters:  ?04/07/22 251 lb (113.9 kg)  ?11/29/21 251 lb 3.2 oz (113.9 kg)  ?10/17/21 251 lb (113.9 kg)  ? ?  ?Physical Exam ?Vitals reviewed.  ?Constitutional:   ?  General: She is not in acute distress. ?   Appearance: Normal appearance. She is well-developed. She is not diaphoretic.  ?HENT:  ?   Head: Normocephalic and atraumatic.  ?Eyes:  ?   General: No scleral icterus. ?   Conjunctiva/sclera: Conjunctivae normal.  ?Neck:  ?   Thyroid: No thyromegaly.  ?Cardiovascular:  ?   Rate and Rhythm: Normal rate and regular rhythm.  ?   Heart sounds: Normal heart sounds. No murmur heard. ?Pulmonary:  ?   Effort: Pulmonary effort is normal. No  respiratory distress.  ?   Breath sounds: Normal breath sounds. No wheezing, rhonchi or rales.  ?Abdominal:  ?   General: There is no distension.  ?   Palpations: Abdomen is soft.  ?   Tenderness: There is no abdominal tenderness.  ?Musculoskeletal:  ?   Cervical back: Neck supple.  ?   Right lower leg: No edema.  ?   Left lower leg: No edema.  ?   Comments: Back: No midline TTP, ROM grossly intact.  No TTP SI joint. Mild TTP over R thoracic back in dermatomal distribution.  Negative SLR bilaterally.  Strength and sensation to light touch intact in lower extremities. ?   ?Lymphadenopathy:  ?   Cervical: No cervical adenopathy.  ?Skin: ?   General: Skin is warm and dry.  ?   Findings: No rash.  ?Neurological:  ?   Mental Status: She is alert and oriented to person, place, and time. Mental status is at baseline.  ?Psychiatric:     ?   Mood and Affect: Mood normal.     ?   Behavior: Behavior normal.  ?  ? ? ?No results found for any visits on 04/07/22. ? Assessment & Plan  ?  ? ?1. Acute right-sided thoracic back pain ?2. Thoracic radiculopathy ?- no problem ?- no urinary symptoms to suggest nephrolithiasis ?- no rash to suggest shingles - but advised to watch for it ?- seems MSK related given pain with movement ?- no red flags or need for imaging at ths time ?Plan of rest, intermittent application of cold packs (later, may switch to heat, but do not sleep on heating pad) ?Resume meloxicam daily ?Resume gabapentin qhs ?Tylenol prn ?Discussed longer term treatment plan of home back care exercise program with flexion exercise routine.  ?Proper lifting mechanics with avoidance of heavy lifting discussed.  ?Consider Physical Therapy and XRay studies if not improving.  ?Call or return to clinic prn if these symptoms worsen or fail to improve as anticipated.  Return immediately if worsening.   ? ? ?3. Chronic constipation ?- trial of miralax ?- start with 1 capful daily and titrate to one soft BM daily ?- ok to continue  sennokot, but may be able to d/c in the future.  ? ?Return if symptoms worsen or fail to improve.  ?   ? ?I, Shirlee Latch, MD, have reviewed all documentation for this visit. The documentation on 04/07/22 for the exam, diagnosis, procedures, and orders are all accurate and complete. ? ? ?Erasmo Downer, MD, MPH ?[facility] ?[facility]   ?

## 2022-05-01 ENCOUNTER — Encounter: Payer: Self-pay | Admitting: Family Medicine

## 2022-05-01 ENCOUNTER — Telehealth: Payer: Self-pay | Admitting: Family Medicine

## 2022-05-01 NOTE — Telephone Encounter (Signed)
Patient advised as below.  

## 2022-05-01 NOTE — Telephone Encounter (Signed)
Pt is having upper eye lid surgery at [facility]. ?The dr would like her to stop her aspirin today, but wanted to ask Dr B if that is OK. ?But they just called her today. ?Please advise  ?

## 2022-05-01 NOTE — Telephone Encounter (Signed)
Yes. Please hold aspirin for 5 days before surgery. Can resume day after surgery as long as ok with surgeon.

## 2022-05-15 IMAGING — CT CT HEAD W/O CM
3 series · 16 of 46 positions shown, 19 images · non-contrast
Comparison: CT head dated July 19, 2020.

CLINICAL DATA: Dizziness.

EXAM:
CT HEAD WITHOUT CONTRAST
TECHNIQUE: Contiguous axial images were obtained from the base of the skull
through the vertex without intravenous contrast.

[Series 2: head wo · axial · 0.40mm/px · z∈[+1078,+1198]mm · 10 of 29 slices shown, 13 images]
[im 3/29  brain]
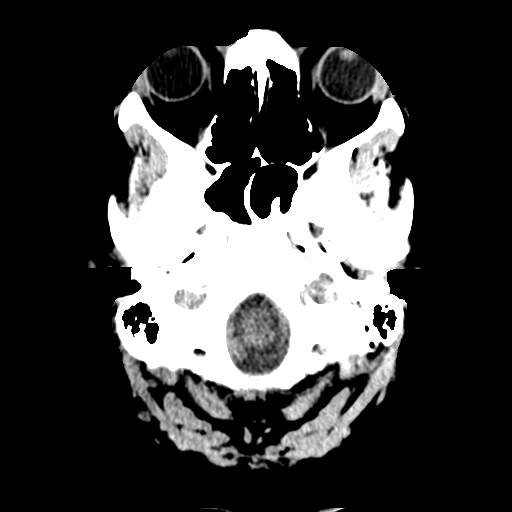
[im 3/29  bone]
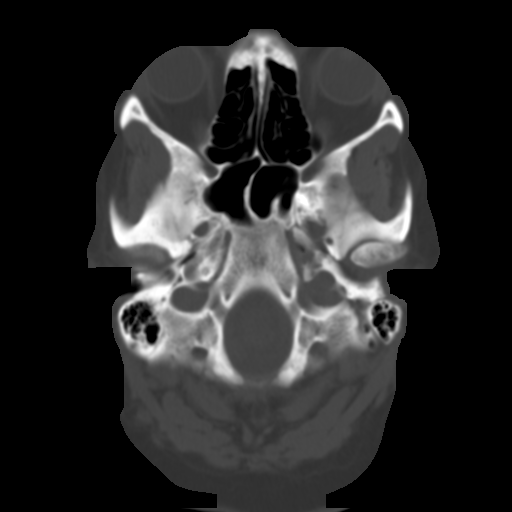
[im 6/29  brain]
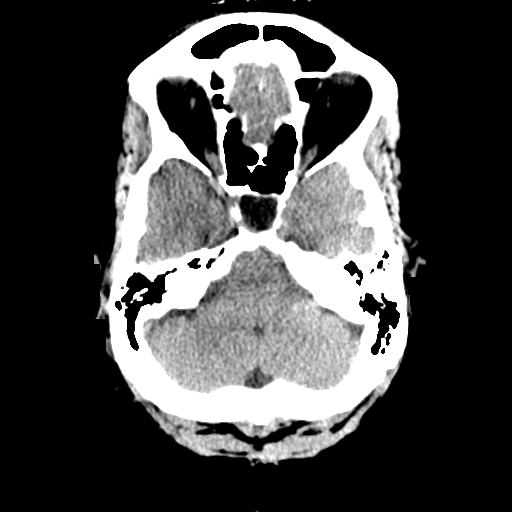
[im 8/29  brain]
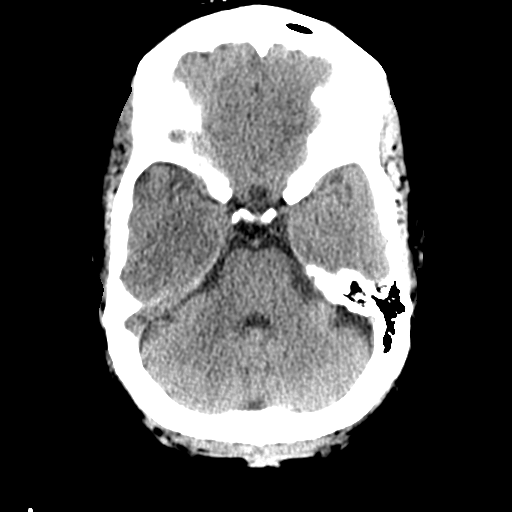
[im 11/29  brain]
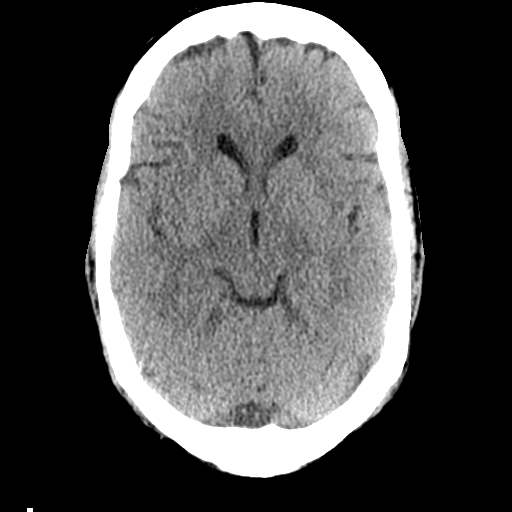
[im 14/29  brain]
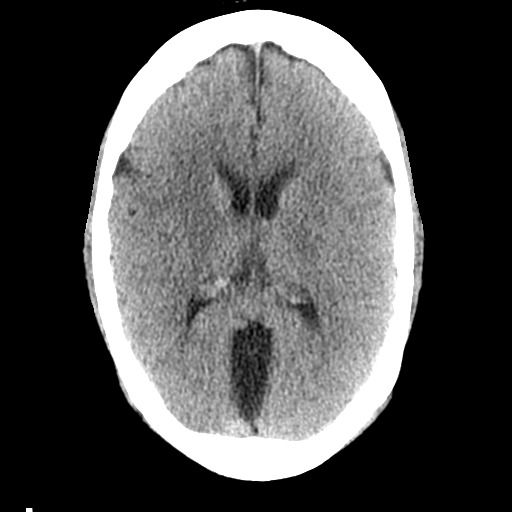
[im 14/29  bone]
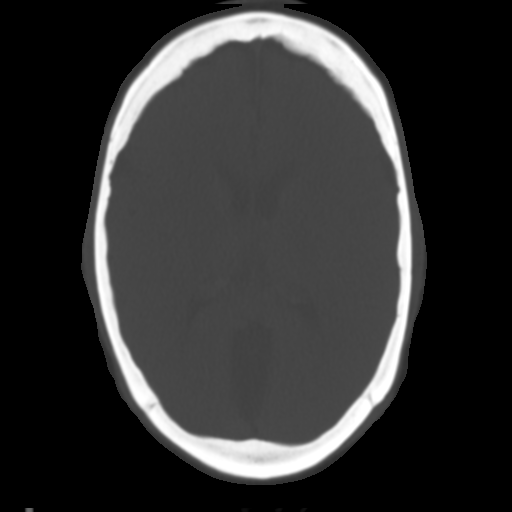
[im 16/29  brain]
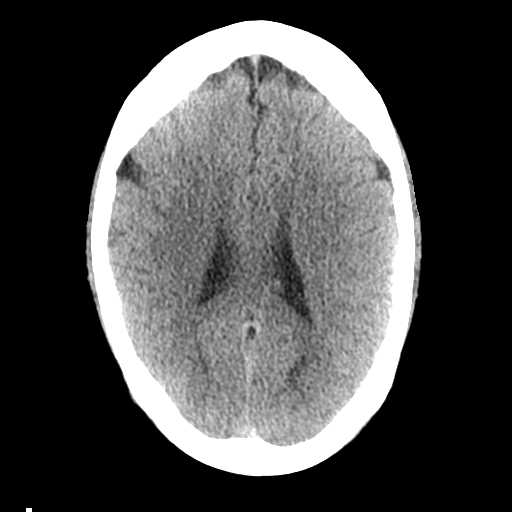
[im 19/29  brain]
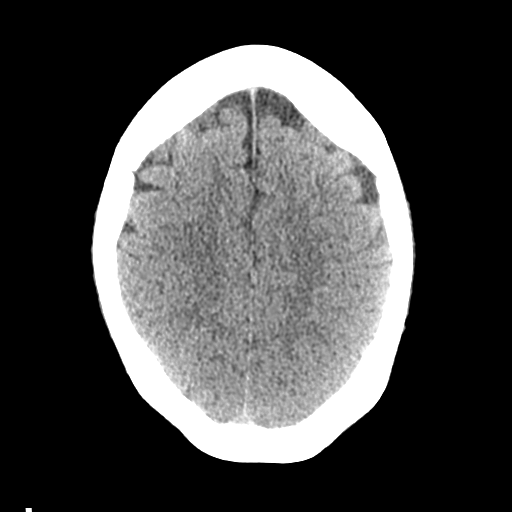
[im 22/29  brain]
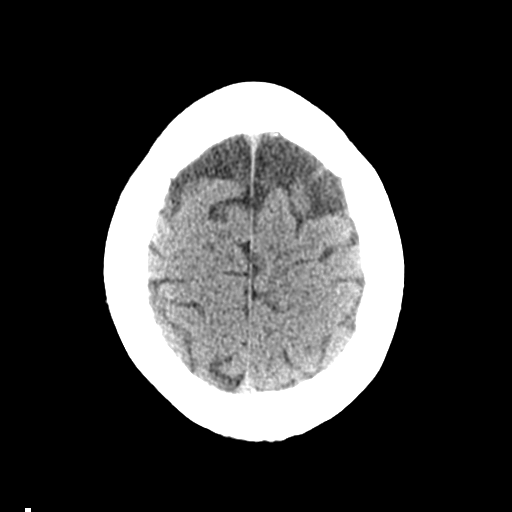
[im 24/29  brain]
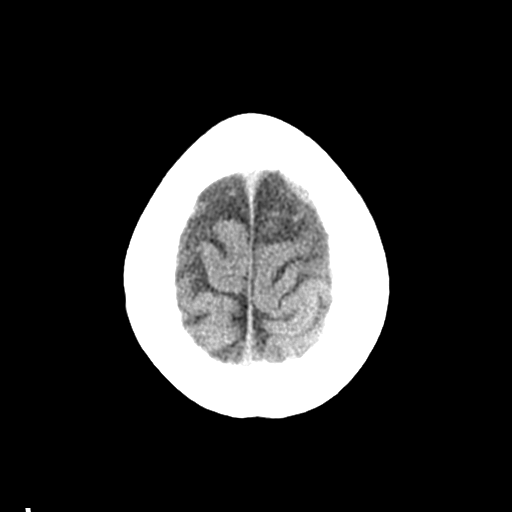
[im 24/29  bone]
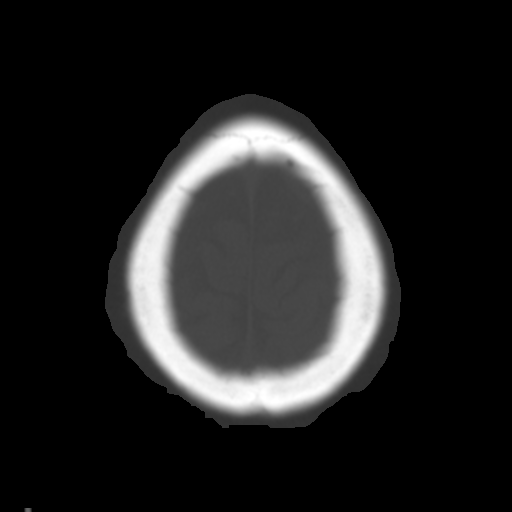
[im 27/29  brain]
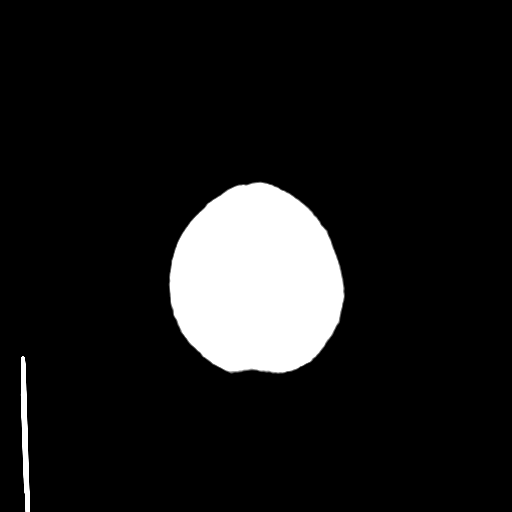

[Series 4: coronal soft tissue · coronal · 0.29mm/px · 3 of 64 slices shown]
[im 22/64  brain]
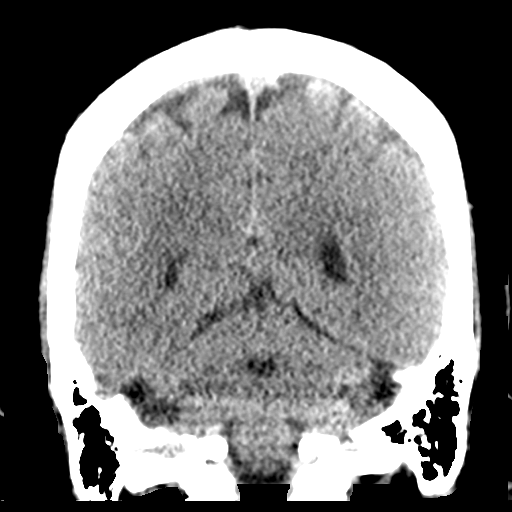
[im 29/64  brain]
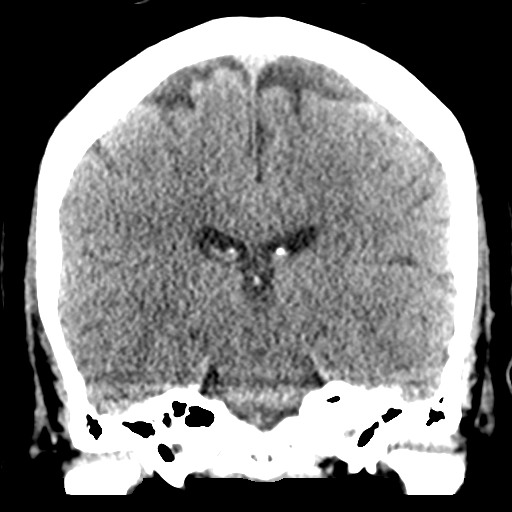
[im 36/64  brain]
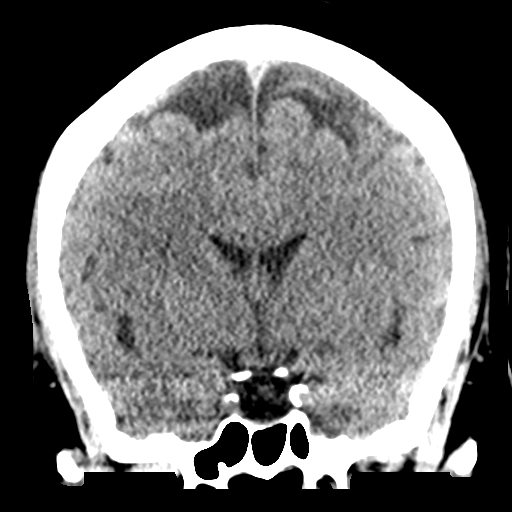

[Series 5: sagittal soft tissue · sagittal · 0.29mm/px · 3 of 50 slices shown]
[im 17/50  brain]
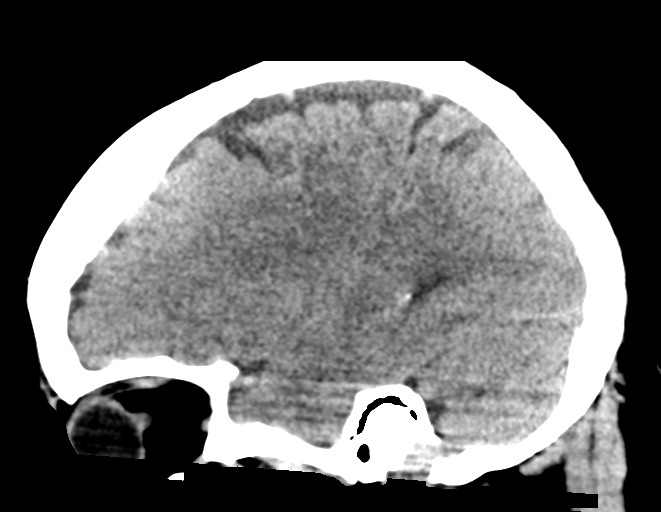
[im 25/50  brain]
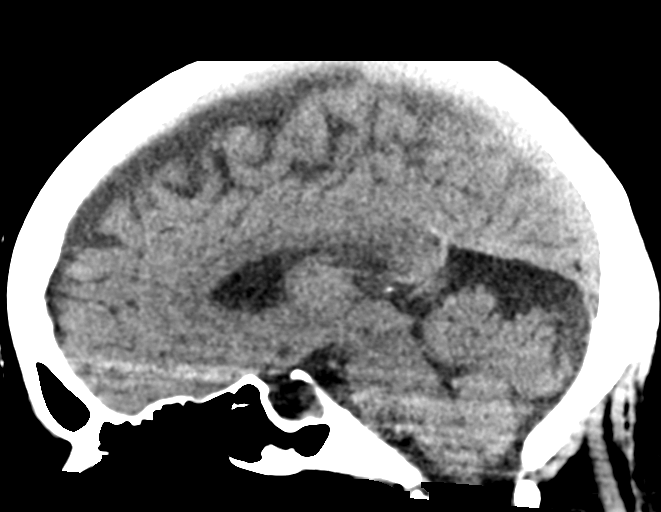
[im 33/50  brain]
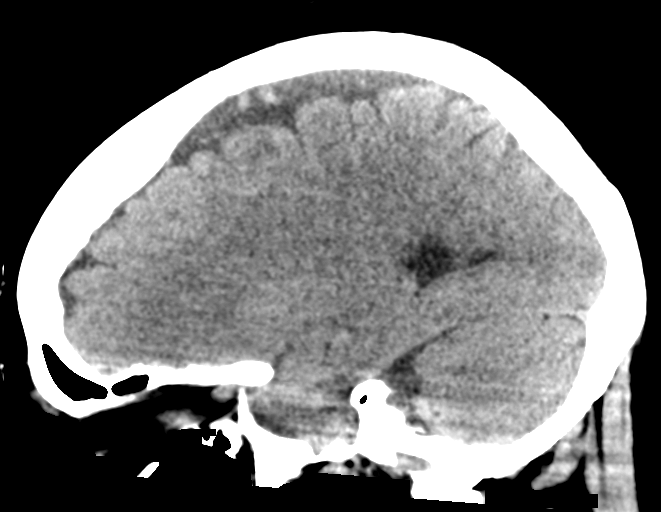

[16 of 46 positions shown; findings below may reference images not displayed]

FINDINGS: Brain: No evidence of acute infarction, hemorrhage, hydrocephalus,
extra-axial collection or mass lesion/mass effect.

Vascular: No hyperdense vessel or unexpected calcification.

Skull: Normal. Negative for fracture or focal lesion.

Sinuses/Orbits: No acute finding.

Other: None.
IMPRESSION: 1. Normal noncontrast head CT.

## 2022-05-29 ENCOUNTER — Ambulatory Visit
Admission: RE | Admit: 2022-05-29 | Discharge: 2022-05-29 | Disposition: A | Discharge status: Home / Self Care | Payer: Managed Care, Other (non HMO) | Source: Ambulatory Visit | Attending: Family Medicine | Admitting: Family Medicine

## 2022-05-29 DIAGNOSIS — Z1231 Encounter for screening mammogram for malignant neoplasm of breast: Secondary | ICD-10-CM | POA: Insufficient documentation
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
## 2022-06-06 ENCOUNTER — Encounter: Payer: Self-pay | Admitting: Family Medicine

## 2022-06-06 ENCOUNTER — Ambulatory Visit (INDEPENDENT_AMBULATORY_CARE_PROVIDER_SITE_OTHER): Payer: Managed Care, Other (non HMO) | Admitting: Family Medicine

## 2022-06-06 VITALS — BP 116/86 | HR 71 | Temp 98.4°F | Resp 16 | Ht 65.0 in | Wt 257.4 lb

## 2022-06-06 DIAGNOSIS — Z23 Encounter for immunization: Secondary | ICD-10-CM

## 2022-06-06 DIAGNOSIS — Z0000 Encounter for general adult medical examination without abnormal findings: Secondary | ICD-10-CM

## 2022-06-06 DIAGNOSIS — E1159 Type 2 diabetes mellitus with other circulatory complications: Secondary | ICD-10-CM

## 2022-06-06 DIAGNOSIS — E1169 Type 2 diabetes mellitus with other specified complication: Secondary | ICD-10-CM

## 2022-06-06 DIAGNOSIS — Z114 Encounter for screening for human immunodeficiency virus [HIV]: Secondary | ICD-10-CM

## 2022-06-06 DIAGNOSIS — E785 Hyperlipidemia, unspecified: Secondary | ICD-10-CM

## 2022-06-06 DIAGNOSIS — I152 Hypertension secondary to endocrine disorders: Secondary | ICD-10-CM

## 2022-06-06 DIAGNOSIS — E559 Vitamin D deficiency, unspecified: Secondary | ICD-10-CM

## 2022-06-06 LAB — POCT GLYCOSYLATED HEMOGLOBIN (HGB A1C)
Est. average glucose Bld gHb Est-mCnc: 148
Hemoglobin A1C: 6.8 % — AB (ref 4.0–5.6)

## 2022-06-06 NOTE — Assessment & Plan Note (Signed)
Previously well controlled Continue statin Repeat FLP and CMP Goal LDL < 70 

## 2022-06-06 NOTE — Assessment & Plan Note (Signed)
Well controlled Continue current medications Recheck metabolic panel F/u in 6 months  

## 2022-06-06 NOTE — Assessment & Plan Note (Signed)
Discussed importance of healthy weight management Discussed diet and exercise  

## 2022-06-06 NOTE — Assessment & Plan Note (Signed)
Well controlled with A1c 6.8 Continue diet control UTD on vaccines, eye exam, foot exam On ACEi/ARB On Statin Discussed diet and exercise F/u in 6 months

## 2022-06-08 LAB — COMPREHENSIVE METABOLIC PANEL
ALT: 30 IU/L (ref 0–32)
AST: 21 IU/L (ref 0–40)
Albumin/Globulin Ratio: 2.1 (ref 1.2–2.2)
Albumin: 4.6 g/dL (ref 3.8–4.8)
Alkaline Phosphatase: 62 IU/L (ref 44–121)
BUN/Creatinine Ratio: 25 (ref 12–28)
BUN: 23 mg/dL (ref 8–27)
Bilirubin Total: 0.3 mg/dL (ref 0.0–1.2)
CO2: 24 mmol/L (ref 20–29)
Calcium: 9.6 mg/dL (ref 8.7–10.3)
Chloride: 101 mmol/L (ref 96–106)
Creatinine, Ser: 0.93 mg/dL (ref 0.57–1.00)
Globulin, Total: 2.2 g/dL (ref 1.5–4.5)
Glucose: 105 mg/dL — ABNORMAL HIGH (ref 70–99)
Potassium: 4.6 mmol/L (ref 3.5–5.2)
Sodium: 139 mmol/L (ref 134–144)
Total Protein: 6.8 g/dL (ref 6.0–8.5)
eGFR: 69 mL/min/{1.73_m2} (ref >59)

## 2022-06-08 LAB — LIPID PANEL WITH LDL/HDL RATIO
Cholesterol, Total: 147 mg/dL (ref 100–199)
HDL: 52 mg/dL (ref >39)
LDL Chol Calc (NIH): 69 mg/dL (ref 0–99)
LDL/HDL Ratio: 1.3 ratio (ref 0.0–3.2)
Triglycerides: 151 mg/dL — ABNORMAL HIGH (ref 0–149)
VLDL Cholesterol Cal: 26 mg/dL (ref 5–40)

## 2022-06-08 LAB — HIV ANTIBODY (ROUTINE TESTING W REFLEX): HIV Screen 4th Generation wRfx: NONREACTIVE

## 2022-06-08 LAB — MICROALBUMIN / CREATININE URINE RATIO
Creatinine, Urine: 248.5 mg/dL
Microalb/Creat Ratio: 6 mg/g creat (ref 0–29)
Microalbumin, Urine: 14.4 ug/mL

## 2022-06-08 LAB — VITAMIN D 25 HYDROXY (VIT D DEFICIENCY, FRACTURES): Vit D, 25-Hydroxy: 54.9 ng/mL (ref 30.0–100.0)

## 2022-06-16 ENCOUNTER — Encounter: Payer: Self-pay | Admitting: Family Medicine

## 2022-06-16 NOTE — Telephone Encounter (Signed)
Form printed and placed on providers box

## 2022-06-19 ENCOUNTER — Other Ambulatory Visit: Payer: Self-pay | Admitting: Family Medicine

## 2022-06-19 DIAGNOSIS — E7800 Pure hypercholesterolemia, unspecified: Secondary | ICD-10-CM

## 2022-06-19 LAB — HM DIABETES EYE EXAM

## 2022-09-18 ENCOUNTER — Encounter: Payer: Self-pay | Admitting: Physician Assistant

## 2022-09-19 ENCOUNTER — Ambulatory Visit: Payer: Managed Care, Other (non HMO) | Admitting: Physician Assistant

## 2022-09-19 ENCOUNTER — Encounter: Payer: Self-pay | Admitting: Physician Assistant

## 2022-09-19 VITALS — BP 137/80 | HR 68 | Ht 65.0 in | Wt 253.0 lb

## 2022-09-19 DIAGNOSIS — Z23 Encounter for immunization: Secondary | ICD-10-CM

## 2022-09-19 DIAGNOSIS — R0683 Snoring: Secondary | ICD-10-CM | POA: Insufficient documentation

## 2022-09-19 DIAGNOSIS — G43009 Migraine without aura, not intractable, without status migrainosus: Secondary | ICD-10-CM

## 2022-09-19 DIAGNOSIS — M545 Low back pain, unspecified: Secondary | ICD-10-CM | POA: Diagnosis not present

## 2022-09-19 NOTE — Assessment & Plan Note (Signed)
Advised likely 2/2 to sedentary nature, advised frequent breaks w/ stretching.  Similar etiology to neck/shoulder pain.  At pain persists, any radiation of pain or numbness, call office can consider imaging

## 2022-09-19 NOTE — Progress Notes (Signed)
I,Angela Hodges,acting as a Neurosurgeon for [employer], PA-C.,have documented all relevant documentation on the behalf of Angela Ferguson, PA-C,as directed by  Angela Ferguson, PA-C while in the presence of Angela Ferguson, PA-C.   Established patient visit   Patient: Angela Hodges   DOB: March 20, 1956   66 y.o. Female  MRN: [id number] Visit Date: 09/19/2022  Today's healthcare provider: [name], PA-C   Cc. Low back pain, snoring, fmla  Subjective      Pt reports for the last three weeks on and off lumbar pain -- across her low back, denies improvement w/ movement, but after rest with feel better. Denies radiation of pain, numbness, paresthesias. She also appreciates and on and off pain across her upper back/shoulders. Again denies radiation, numbness, paresthesias, weakness.  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 Not much physical activity.   Pt reports at one point having a referral for a sleep study-- she snores, and recently her friend told her she thinks she catches her breath at night. Denies morning headaches, daytime sleepiness.   Pt is also needing updated FMLA paperwork for migraines-- over the last year has has 1-2 migraines, usually manages with tylenol and sleep. Medications: Outpatient Medications Prior to Visit  Medication Sig   aspirin 81 MG tablet Take 1 tablet by mouth daily.   atenolol (TENORMIN) 25 MG tablet TAKE 1 TABLET BY MOUTH DAILY   Cholecalciferol 1000 UNITS capsule Take 1 capsule by mouth 3 (three) times daily.   fluticasone (FLONASE) 50 MCG/ACT nasal spray Place 2 sprays into both nostrils daily.   meclizine (ANTIVERT) 25 MG tablet Take 1 tablet (25 mg total) by mouth 3 (three) times daily as needed for dizziness.   meloxicam (MOBIC) 15 MG tablet Take 15 mg by mouth daily.   Omega-3 Fatty Acids (FISH OIL) 1200 MG CAPS Take by mouth.   rosuvastatin (CRESTOR) 5 MG tablet TAKE 1 TABLET BY MOUTH  DAILY   valACYclovir (VALTREX) 1000 MG  tablet TAKE 1 TABLET BY MOUTH DAILY   valsartan-hydrochlorothiazide (DIOVAN-HCT) 320-25 MG tablet TAKE 1 TABLET BY MOUTH DAILY   No facility-administered medications prior to visit.    Review of Systems  Musculoskeletal:  Positive for back pain.     Objective    Blood pressure 137/80, pulse 68, height 5\' 5"  (1.651 m), weight 253 lb (114.8 kg), SpO2 100 %.   Physical Exam Constitutional:      General: She is awake.     Appearance: She is well-developed.  HENT:     Head: Normocephalic.  Eyes:     Conjunctiva/sclera: Conjunctivae normal.  Cardiovascular:     Rate and Rhythm: Normal rate and regular rhythm.     Heart sounds: Normal heart sounds.  Pulmonary:     Effort: Pulmonary effort is normal.     Breath sounds: Normal breath sounds.  Musculoskeletal:     Comments: No tenderness across lumbar spine. Normal ROM, negative b/l SLR, normal ROM of b/l hips without pain.   Skin:    General: Skin is warm.  Neurological:     Mental Status: She is alert and oriented to person, place, and time.  Psychiatric:        Attention and Perception: Attention normal.        Mood and Affect: Mood normal.        Speech: Speech normal.        Behavior: Behavior is cooperative.  No results found for any visits on 09/19/22.  Assessment & Plan     Problem List Items Addressed This Visit       Cardiovascular and Mediastinum   Migraine without aura and without status migrainosus, not intractable    Very intermittent <5 this year manages with tylenol Okay with fmla continuing for now but consider stopping if migraine frequency continues to be low         Other   Morbid obesity (HCC)    Given obesity, snoring, elevated A1c, risks for sleep apnea despite low epworth score. Recommend sleep study- referral placed for home study      Relevant Orders   Ambulatory referral to Sleep Studies   Acute bilateral low back pain without sciatica - Primary    Advised likely 2/2 to sedentary  nature, advised frequent breaks w/ stretching.  Similar etiology to neck/shoulder pain.  At pain persists, any radiation of pain or numbness, call office can consider imaging      Snoring    Risks for sleep apnea including family history, obesity, snoring, elevated a1c, recommend sleep study      Relevant Orders   Ambulatory referral to Sleep Studies   Other Visit Diagnoses     Needs flu shot       Relevant Orders   Flu Vaccine QUAD 6+ mos PF IM (Fluarix Quad PF) (Completed)        Return if symptoms worsen or fail to improve, at next scheduled visit for chronic care.      I, Angela Ferguson, PA-C have reviewed all documentation for this visit. The documentation on  09/19/2022 for the exam, diagnosis, procedures, and orders are all accurate and complete.   Angela Ferguson, PA-C [facility] [address] [place], [place], [address] Office: [phone] Fax: [phone]   [facility]

## 2022-09-19 NOTE — Assessment & Plan Note (Signed)
Given obesity, snoring, elevated A1c, risks for sleep apnea despite low epworth score. Recommend sleep study- referral placed for home study

## 2022-09-19 NOTE — Assessment & Plan Note (Signed)
Risks for sleep apnea including family history, obesity, snoring, elevated a1c, recommend sleep study

## 2022-09-19 NOTE — Assessment & Plan Note (Signed)
Very intermittent <5 this year manages with tylenol Okay with fmla continuing for now but consider stopping if migraine frequency continues to be low

## 2022-09-22 ENCOUNTER — Encounter: Payer: Self-pay | Admitting: Physician Assistant

## 2022-09-23 ENCOUNTER — Encounter: Payer: Self-pay | Admitting: Physician Assistant

## 2022-10-23 NOTE — Progress Notes (Signed)
I,Angela Hodges,acting as a Neurosurgeon for [employer], PA-C.,have documented all relevant documentation on the behalf of Angela Ferguson, PA-C,as directed by  Angela Ferguson, PA-C while in the presence of Angela Ferguson, PA-C.   Established patient visit   Patient: Angela Hodges   DOB: Jul 20, 1956   66 y.o. Female  MRN: [id number] Visit Date: 10/24/2022  Today's healthcare provider: [name], PA-C   Cc. Angela Hodges  Subjective    HPI  Follow up for Angela  Pt was called by [facility] regarding her sleep study and cpap machine.   She is concerned over the results.  -----------------------------------------------------------------------------------------   Medications: Outpatient Medications Prior to Visit  Medication Sig   aspirin 81 MG tablet Take 1 tablet by mouth daily.   atenolol (TENORMIN) 25 MG tablet TAKE 1 TABLET BY MOUTH DAILY   Cholecalciferol 1000 UNITS capsule Take 1 capsule by mouth 3 (three) times daily.   fluticasone (FLONASE) 50 MCG/ACT nasal spray Place 2 sprays into both nostrils daily.   meclizine (ANTIVERT) 25 MG tablet Take 1 tablet (25 mg total) by mouth 3 (three) times daily as needed for dizziness.   meloxicam (MOBIC) 15 MG tablet Take 15 mg by mouth daily.   Omega-3 Fatty Acids (FISH OIL) 1200 MG CAPS Take by mouth.   rosuvastatin (CRESTOR) 5 MG tablet TAKE 1 TABLET BY MOUTH  DAILY   valACYclovir (VALTREX) 1000 MG tablet TAKE 1 TABLET BY MOUTH DAILY   valsartan-hydrochlorothiazide (DIOVAN-HCT) 320-25 MG tablet TAKE 1 TABLET BY MOUTH DAILY   No facility-administered medications prior to visit.      Objective    BP (!) 155/91 (BP Location: Right Wrist, Patient Position: Sitting, Cuff Size: Normal)   Pulse 67   Wt 253 lb (114.8 kg)   SpO2 100%   BMI 42.10 kg/m    Physical Exam Vitals reviewed.  Constitutional:      Appearance: She is not ill-appearing.  HENT:     Head: Normocephalic.  Eyes:     Conjunctiva/sclera: Conjunctivae  normal.  Cardiovascular:     Rate and Rhythm: Normal rate.  Pulmonary:     Effort: Pulmonary effort is normal. No respiratory distress.  Neurological:     General: No focal deficit present.     Mental Status: She is alert and oriented to person, place, and time.  Psychiatric:        Mood and Affect: Mood normal.        Behavior: Behavior normal.     No results found for any visits on 10/24/22.  Assessment & Plan     Problem List Items Addressed This Visit       Respiratory   Severe sleep apnea - Primary    Reviewed sleep study together  O2 low of  63% for < 0.5 min under 88% for a total of 21 minutes  There were a total of 157 apneic events over night.   Advised she Hodges with pulmonology as this is a rating of severe sleep apnea which does not correlate with her current symptoms. Advised she may need an in-person sleep study to confirm findings         Return if symptoms worsen or fail to improve.      I, Angela Ferguson, PA-C have reviewed all documentation for this visit. The documentation on  10/24/2022  for the exam, diagnosis, procedures, and orders are all accurate and complete.  Angela Ferguson, PA-C [facility] [address]  [place], [place], [address] Office: [phone] Fax: [phone]   [facility]

## 2022-10-24 ENCOUNTER — Encounter: Payer: Self-pay | Admitting: Physician Assistant

## 2022-10-24 ENCOUNTER — Other Ambulatory Visit: Payer: Self-pay | Admitting: Physician Assistant

## 2022-10-24 ENCOUNTER — Ambulatory Visit: Payer: Managed Care, Other (non HMO) | Admitting: Physician Assistant

## 2022-10-24 VITALS — BP 155/91 | HR 67 | Wt 253.0 lb

## 2022-10-24 DIAGNOSIS — G4730 Sleep apnea, unspecified: Secondary | ICD-10-CM | POA: Diagnosis not present

## 2022-10-24 DIAGNOSIS — R0902 Hypoxemia: Secondary | ICD-10-CM

## 2022-10-24 NOTE — Assessment & Plan Note (Signed)
Reviewed sleep study together  O2 low of  63% for < 0.5 min under 88% for a total of 21 minutes  There were a total of 157 apneic events over night.   Advised she f/u with pulmonology as this is a rating of severe sleep apnea which does not correlate with her current symptoms. Advised she may need an in-person sleep study to confirm findings

## 2022-12-04 ENCOUNTER — Other Ambulatory Visit: Payer: Self-pay | Admitting: Family Medicine

## 2022-12-04 DIAGNOSIS — E7800 Pure hypercholesterolemia, unspecified: Secondary | ICD-10-CM

## 2022-12-12 ENCOUNTER — Ambulatory Visit: Payer: Managed Care, Other (non HMO) | Admitting: Family Medicine

## 2022-12-17 ENCOUNTER — Encounter: Payer: Self-pay | Admitting: Primary Care

## 2022-12-17 ENCOUNTER — Institutional Professional Consult (permissible substitution): Payer: Managed Care, Other (non HMO) | Admitting: Primary Care

## 2022-12-17 ENCOUNTER — Ambulatory Visit (INDEPENDENT_AMBULATORY_CARE_PROVIDER_SITE_OTHER): Payer: Managed Care, Other (non HMO) | Admitting: Primary Care

## 2022-12-17 VITALS — BP 138/80 | HR 78 | Temp 97.8°F | Ht 65.0 in | Wt 249.9 lb

## 2022-12-17 DIAGNOSIS — G4730 Sleep apnea, unspecified: Secondary | ICD-10-CM

## 2022-12-17 DIAGNOSIS — G4733 Obstructive sleep apnea (adult) (pediatric): Secondary | ICD-10-CM | POA: Diagnosis not present

## 2022-12-17 NOTE — Assessment & Plan Note (Signed)
-   Patient has snoring symptoms. No significant associated daytime sleepiness. Epworth score 2-3/24. Sleep study in October 2023 with [facility] showed evidence of severe sleep apnea, AHI 37.3/hr with SpO2 low 63% (average 99%). We discussed sleep study results, risks of untreated sleep apnea and treatment options. Due to severity of her sleep apnea recommending patient be started on auto CPAP 5-15cm h20. She is open to starting CPAP therapy. DME placed. Advised patient aim to wear CPAP every night min 4-6 hours. Encourage weight loss efforts and side sleeping position/elevate head of bed. FU in 31-90 days for CPAP compliance check.
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
## 2022-12-17 NOTE — Patient Instructions (Signed)
Sleep study showed severe sleep apnea  Treatment options include weight loss, side sleeping position/elevate head of bed at night, oral appliance, CPAP or referral to ENT  Aim to wear CPAP every night minimum 4 hours or longer  Do not drive if tired    Orders: Auto CPAP 5-15cm h20 with mask of choice and heat humidity  Follow-up: 31-90 days after starting CPAP for compliance check    CPAP and BIPAP Information CPAP and BIPAP are methods that use air pressure to keep your airways open and to help you breathe well. CPAP and BIPAP use different amounts of pressure. Your health care provider will tell you whether CPAP or BIPAP would be more helpful for you. CPAP stands for "continuous positive airway pressure." With CPAP, the amount of pressure stays the same while you breathe in (inhale) and out (exhale). BIPAP stands for "bi-level positive airway pressure." With BIPAP, the amount of pressure will be higher when you inhale and lower when you exhale. This allows you to take larger breaths. CPAP or BIPAP may be used in the hospital, or your health care provider may want you to use it at home. You may need to have a sleep study before your health care provider can order a machine for you to use at home. What are the advantages? CPAP or BIPAP can be helpful if you have: Sleep apnea. Chronic obstructive pulmonary disease (COPD). Heart failure. Medical conditions that cause muscle weakness, including muscular dystrophy or amyotrophic lateral sclerosis (ALS). Other problems that cause breathing to be shallow, weak, abnormal, or difficult. CPAP and BIPAP are most commonly used for obstructive sleep apnea (OSA) to keep the airways from collapsing when the muscles relax during sleep. What are the risks? Generally, this is a safe treatment. However, problems may occur, including: Irritated skin or skin sores if the mask does not fit properly. Dry or stuffy nose or nosebleeds. Dry mouth. Feeling  gassy or bloated. Sinus or lung infection if the equipment is not cleaned properly. When should CPAP or BIPAP be used? In most cases, the mask only needs to be worn during sleep. Generally, the mask needs to be worn throughout the night and during any daytime naps. People with certain medical conditions may also need to wear the mask at other times, such as when they are awake. Follow instructions from your health care provider about when to use the machine. What happens during CPAP or BIPAP?  Both CPAP and BIPAP are provided by a small machine with a flexible plastic tube that attaches to a plastic mask that you wear. Air is blown through the mask into your nose or mouth. The amount of pressure that is used to blow the air can be adjusted on the machine. Your health care provider will set the pressure setting and help you find the best mask for you. Tips for using the mask Because the mask needs to be snug, some people feel trapped or closed-in (claustrophobic) when first using the mask. If you feel this way, you may need to get used to the mask. One way to do this is to hold the mask loosely over your nose or mouth and then gradually apply the mask more snugly. You can also gradually increase the amount of time that you use the mask. Masks are available in various types and sizes. If your mask does not fit well, talk with your health care provider about getting a different one. Some common types of masks include: Full face  masks, which fit over the mouth and nose. Nasal masks, which fit over the nose. Nasal pillow or prong masks, which fit into the nostrils. If you are using a mask that fits over your nose and you tend to breathe through your mouth, a chin strap may be applied to help keep your mouth closed. Use a skin barrier to protect your skin as told by your health care provider. Some CPAP and BIPAP machines have alarms that may sound if the mask comes off or develops a leak. If you have  trouble with the mask, it is very important that you talk with your health care provider about finding a way to make the mask easier to tolerate. Do not stop using the mask. There could be a negative impact on your health if you stop using the mask. Tips for using the machine Place your CPAP or BIPAP machine on a secure table or stand near an electrical outlet. Know where the on/off switch is on the machine. Follow instructions from your health care provider about how to set the pressure on your machine and when you should use it. Do not eat or drink while the CPAP or BIPAP machine is on. Food or fluids could get pushed into your lungs by the pressure of the CPAP or BIPAP. For home use, CPAP and BIPAP machines can be rented or purchased through home health care companies. Many different brands of machines are available. Renting a machine before purchasing may help you find out which particular machine works well for you. Your health insurance company may also decide which machine you may get. Keep the CPAP or BIPAP machine and attachments clean. Ask your health care provider for specific instructions. Check the humidifier if you have a dry stuffy nose or nosebleeds. Make sure it is working correctly. Follow these instructions at home: Take over-the-counter and prescription medicines only as told by your health care provider. Ask if you can take sinus medicine if your sinuses are blocked. Do not use any products that contain nicotine or tobacco. These products include cigarettes, chewing tobacco, and vaping devices, such as e-cigarettes. If you need help quitting, ask your health care provider. Keep all follow-up visits. This is important. Contact a health care provider if: You have redness or pressure sores on your head, face, mouth, or nose from the mask or head gear. You have trouble using the CPAP or BIPAP machine. You cannot tolerate wearing the CPAP or BIPAP mask. Someone tells you that you  snore even when wearing your CPAP or BIPAP. Get help right away if: You have trouble breathing. You feel confused. Summary CPAP and BIPAP are methods that use air pressure to keep your airways open and to help you breathe well. If you have trouble with the mask, it is very important that you talk with your health care provider about finding a way to make the mask easier to tolerate. Do not stop using the mask. There could be a negative impact to your health if you stop using the mask. Follow instructions from your health care provider about when to use the machine. This information is not intended to replace advice given to you by your health care provider. Make sure you discuss any questions you have with your health care provider. Document Revised: 07/24/2021 Document Reviewed: 11/23/2020 Elsevier Patient Education  2023 [employer].

## 2022-12-18 ENCOUNTER — Ambulatory Visit: Payer: Managed Care, Other (non HMO) | Admitting: Family Medicine

## 2022-12-18 ENCOUNTER — Encounter: Payer: Self-pay | Admitting: Family Medicine

## 2022-12-18 DIAGNOSIS — E1169 Type 2 diabetes mellitus with other specified complication: Secondary | ICD-10-CM | POA: Diagnosis not present

## 2022-12-18 DIAGNOSIS — E1159 Type 2 diabetes mellitus with other circulatory complications: Secondary | ICD-10-CM

## 2022-12-18 DIAGNOSIS — E785 Hyperlipidemia, unspecified: Secondary | ICD-10-CM

## 2022-12-18 DIAGNOSIS — Z6841 Body Mass Index (BMI) 40.0 and over, adult: Secondary | ICD-10-CM | POA: Diagnosis not present

## 2022-12-18 DIAGNOSIS — I152 Hypertension secondary to endocrine disorders: Secondary | ICD-10-CM

## 2022-12-18 LAB — POCT GLYCOSYLATED HEMOGLOBIN (HGB A1C)
Est. average glucose Bld gHb Est-mCnc: 151
Hemoglobin A1C: 6.9 % — AB (ref 4.0–5.6)

## 2022-12-18 MED ORDER — TIRZEPATIDE 2.5 MG/0.5ML ~~LOC~~ SOAJ: 2.5000 mg | SUBCUTANEOUS | 1 refills | Status: DC

## 2022-12-18 NOTE — Assessment & Plan Note (Addendum)
Discussed importance of healthy weight management Discussed diet and exercise  Mounjaro as above

## 2022-12-18 NOTE — Assessment & Plan Note (Signed)
Well controlled Continue current medications Recheck metabolic panel F/u in 6 months  

## 2022-12-18 NOTE — Assessment & Plan Note (Addendum)
Well controlled, but increasing Assoc with HTN and HLD Continue diet control, but as it is increasing, will consider Mounjaro (also for weight loss) UTD on vaccines, foot exam Reminded of need for eye exam  On ACEi/ARB On Statin Discussed diet and exercise F/u in 6 months

## 2022-12-18 NOTE — Assessment & Plan Note (Signed)
Previously well controlled Continue statin Repeat FLP and CMP Goal LDL < 70 

## 2022-12-19 LAB — COMPREHENSIVE METABOLIC PANEL
ALT: 20 IU/L (ref 0–32)
AST: 15 IU/L (ref 0–40)
Albumin/Globulin Ratio: 2.2 (ref 1.2–2.2)
Albumin: 4.4 g/dL (ref 3.9–4.9)
Alkaline Phosphatase: 56 IU/L (ref 44–121)
BUN/Creatinine Ratio: 23 (ref 12–28)
BUN: 21 mg/dL (ref 8–27)
Bilirubin Total: 0.4 mg/dL (ref 0.0–1.2)
CO2: 24 mmol/L (ref 20–29)
Calcium: 10 mg/dL (ref 8.7–10.3)
Chloride: 97 mmol/L (ref 96–106)
Creatinine, Ser: 0.93 mg/dL (ref 0.57–1.00)
Globulin, Total: 2 g/dL (ref 1.5–4.5)
Glucose: 107 mg/dL — ABNORMAL HIGH (ref 70–99)
Potassium: 4.5 mmol/L (ref 3.5–5.2)
Sodium: 136 mmol/L (ref 134–144)
Total Protein: 6.4 g/dL (ref 6.0–8.5)
eGFR: 69 mL/min/{1.73_m2} (ref >59)

## 2022-12-19 LAB — LIPID PANEL
Chol/HDL Ratio: 2.9 ratio (ref 0.0–4.4)
Cholesterol, Total: 147 mg/dL (ref 100–199)
HDL: 50 mg/dL (ref >39)
LDL Chol Calc (NIH): 76 mg/dL (ref 0–99)
Triglycerides: 118 mg/dL (ref 0–149)
VLDL Cholesterol Cal: 21 mg/dL (ref 5–40)

## 2022-12-21 NOTE — Progress Notes (Signed)
Reviewed and agree with assessment/plan.   Mareo Portilla, MD [facility] 12/21/2022, 7:20 PM Pager:  [phone]  

## 2022-12-24 ENCOUNTER — Encounter: Payer: Self-pay | Admitting: Family Medicine

## 2022-12-31 ENCOUNTER — Other Ambulatory Visit: Payer: Self-pay | Admitting: Family Medicine

## 2023-01-01 ENCOUNTER — Other Ambulatory Visit: Payer: Self-pay | Admitting: Family Medicine

## 2023-01-01 DIAGNOSIS — I10 Essential (primary) hypertension: Secondary | ICD-10-CM

## 2023-01-07 ENCOUNTER — Encounter: Payer: Self-pay | Admitting: Family Medicine

## 2023-01-08 ENCOUNTER — Telehealth: Payer: Self-pay

## 2023-01-08 NOTE — Telephone Encounter (Signed)
We will do PA again.

## 2023-01-08 NOTE — Telephone Encounter (Signed)
Copied from [place] [id number]. Topic: General - Other >> Jan 07, 2023  3:36 PM Chapman Fitch wrote: Reason for CRM: Pt asked if nurse can call her back about an issue with the Mounjaro/ please advise

## 2023-01-09 NOTE — Telephone Encounter (Signed)
Please send med list and problem list with diabetes diagnosis highlighted as she requested. Let her know when it is done.

## 2023-01-09 NOTE — Telephone Encounter (Signed)
Duplicate Message. Another message sent  KP

## 2023-01-09 NOTE — Telephone Encounter (Signed)
Please review.  KP

## 2023-01-13 ENCOUNTER — Telehealth: Payer: Self-pay | Admitting: Family Medicine

## 2023-01-13 NOTE — Telephone Encounter (Signed)
I am letting you know the PA for Mounjaro did not go through the fax on 01/09/23.    It was declined.   Someone needs to refax it.

## 2023-01-14 NOTE — Telephone Encounter (Signed)
Notes faxed 01/13/23.

## 2023-01-19 ENCOUNTER — Telehealth: Payer: Self-pay | Admitting: Family Medicine

## 2023-01-19 DIAGNOSIS — E1169 Type 2 diabetes mellitus with other specified complication: Secondary | ICD-10-CM

## 2023-01-19 MED ORDER — TIRZEPATIDE 2.5 MG/0.5ML ~~LOC~~ SOAJ: 2.5000 mg | SUBCUTANEOUS | 1 refills | Status: DC

## 2023-01-19 NOTE — Telephone Encounter (Signed)
Prior Auth Approved - pharmacy requesting refill of tirzepatide [facility]) 2.5 MG/0.5ML Pen

## 2023-02-09 ENCOUNTER — Other Ambulatory Visit: Payer: Self-pay | Admitting: Family Medicine

## 2023-02-09 DIAGNOSIS — E7800 Pure hypercholesterolemia, unspecified: Secondary | ICD-10-CM

## 2023-03-09 ENCOUNTER — Other Ambulatory Visit: Payer: Self-pay | Admitting: Family Medicine

## 2023-03-09 DIAGNOSIS — I10 Essential (primary) hypertension: Secondary | ICD-10-CM

## 2023-03-10 ENCOUNTER — Encounter: Payer: Self-pay | Admitting: Family Medicine

## 2023-03-12 NOTE — Telephone Encounter (Signed)
Forms faxed to [phone].

## 2023-03-13 ENCOUNTER — Telehealth: Payer: Self-pay | Admitting: Family Medicine

## 2023-03-13 ENCOUNTER — Ambulatory Visit: Payer: Managed Care, Other (non HMO) | Admitting: Family Medicine

## 2023-03-13 NOTE — Telephone Encounter (Signed)
LVM informing patient FMLA paperwork was completed and faxed on 03/12/2023 & a copy would be left at the front office for pick up. 

## 2023-03-26 NOTE — Progress Notes (Signed)
I,Sulibeya S Dimas,acting as a Neurosurgeon for Shirlee Latch, MD.,have documented all relevant documentation on the behalf of Shirlee Latch, MD,as directed by  Shirlee Latch, MD while in the presence of Shirlee Latch, MD.     Established patient visit   Patient: Angela Hodges   DOB: [birth year]   67 y.o. Female  MRN: [id number] Visit Date: 03/27/2023  Today's healthcare provider: Shirlee Latch, MD   Chief Complaint  Patient presents with   Obesity   Diabetes   Subjective    HPI  Follow up for obesity  The patient was last seen for this 3 months ago. Changes made at last visit include start mounjaro.  She reports fair compliance with treatment. She reports she finally started mounjaro this month. She feels that condition is Unchanged. She is not having side effects.  Wt Readings from Last 3 Encounters:  03/27/23 253 lb 3.2 oz (114.9 kg)  12/17/22 249 lb 14.4 oz (113.4 kg)  10/24/22 253 lb (114.8 kg)    ----------------------------------------------------------------------------------------- Diabetes Mellitus Type II, Follow-up  Lab Results  Component Value Date   HGBA1C 6.7 (A) 03/27/2023   HGBA1C 6.9 (A) 12/18/2022   HGBA1C 6.8 (A) 06/06/2022   Wt Readings from Last 3 Encounters:  03/27/23 253 lb 3.2 oz (114.9 kg)  12/17/22 249 lb 14.4 oz (113.4 kg)  10/24/22 253 lb (114.8 kg)   Last seen for diabetes 3 months ago.  Management since then includes start mounjaro also for weight loss. She reports excellent compliance with treatment. She is not having side effects.   Home blood sugar records: fasting range: 70s-120s  Episodes of hypoglycemia? No    Current insulin regiment: none Most Recent Eye Exam: 12/23. Dr. Alvester Morin Current exercise: walking Current diet habits: in general, a "healthy" diet    Pertinent Labs: Lab Results  Component Value Date   CHOL 147 12/18/2022   HDL 50 12/18/2022   LDLCALC 76 12/18/2022   TRIG 118 12/18/2022    CHOLHDL 2.9 12/18/2022   Lab Results  Component Value Date   NA 136 12/18/2022   K 4.5 12/18/2022   CREATININE 0.93 12/18/2022   EGFR 69 12/18/2022   LABMICR 14.4 06/06/2022   MICRALBCREAT 6 06/06/2022     ---------------------------------------------------------------------------------------------------   Medications: Outpatient Medications Prior to Visit  Medication Sig   aspirin 81 MG tablet Take 1 tablet by mouth daily.   atenolol (TENORMIN) 25 MG tablet TAKE 1 TABLET BY MOUTH DAILY   Cholecalciferol 1000 UNITS capsule Take 1 capsule by mouth 3 (three) times daily.   fluticasone (FLONASE) 50 MCG/ACT nasal spray Place 2 sprays into both nostrils daily.   meclizine (ANTIVERT) 25 MG tablet Take 1 tablet (25 mg total) by mouth 3 (three) times daily as needed for dizziness.   meloxicam (MOBIC) 15 MG tablet Take 15 mg by mouth daily.   Omega-3 Fatty Acids (FISH OIL) 1200 MG CAPS Take by mouth.   rosuvastatin (CRESTOR) 5 MG tablet TAKE 1 TABLET BY MOUTH DAILY   tirzepatide (MOUNJARO) 2.5 MG/0.5ML Pen Inject 2.5 mg into the skin once a week.   valACYclovir (VALTREX) 1000 MG tablet TAKE 1 TABLET BY MOUTH DAILY   valsartan-hydrochlorothiazide (DIOVAN-HCT) 320-25 MG tablet TAKE 1 TABLET BY MOUTH DAILY   No facility-administered medications prior to visit.    Review of Systems  Constitutional:  Negative for appetite change and fatigue.  Eyes:  Negative for visual disturbance.  Respiratory:  Negative for chest tightness.   Cardiovascular:  Negative for  chest pain and leg swelling.  Gastrointestinal:  Negative for abdominal pain, nausea and vomiting.       Objective    BP 122/78 (BP Location: Left Arm, Patient Position: Sitting, Cuff Size: Large)   Pulse 68   Temp 97.8 F (36.6 C) (Temporal)   Resp (!) 24   Ht 5\' 5"  (1.651 m)   Wt 253 lb 3.2 oz (114.9 kg)   BMI 42.13 kg/m    Physical Exam Vitals reviewed.  Constitutional:      General: She is not in acute distress.     Appearance: Normal appearance. She is well-developed. She is not diaphoretic.  HENT:     Head: Normocephalic and atraumatic.  Eyes:     General: No scleral icterus.    Conjunctiva/sclera: Conjunctivae normal.  Neck:     Thyroid: No thyromegaly.  Cardiovascular:     Rate and Rhythm: Normal rate and regular rhythm.     Heart sounds: Normal heart sounds. No murmur heard. Pulmonary:     Effort: Pulmonary effort is normal. No respiratory distress.     Breath sounds: Normal breath sounds. No wheezing, rhonchi or rales.  Musculoskeletal:     Cervical back: Neck supple.     Right lower leg: No edema.     Left lower leg: No edema.  Lymphadenopathy:     Cervical: No cervical adenopathy.  Skin:    General: Skin is warm and dry.     Findings: No rash.  Neurological:     Mental Status: She is alert and oriented to person, place, and time. Mental status is at baseline.  Psychiatric:        Mood and Affect: Mood normal.        Behavior: Behavior normal.       Results for orders placed or performed in visit on 03/27/23  POCT glycosylated hemoglobin (Hb A1C)  Result Value Ref Range   Hemoglobin A1C 6.7 (A) 4.0 - 5.6 %   Est. average glucose Bld gHb Est-mCnc 146     Assessment & Plan     Problem List Items Addressed This Visit       Endocrine   T2DM (type 2 diabetes mellitus) (HCC) - Primary    Well controlled Continue mounjaro at current dose Consider dose titration for further weight loss in the future - patient hesitant currently      Relevant Orders   POCT glycosylated hemoglobin (Hb A1C) (Completed)     Other   Morbid obesity (HCC)    Congratulated on weight loss (5lbs on home scale) Discussed importance of healthy weight management Discussed diet and exercise       BMI 40.0-44.9, adult (HCC)    As above - continue mounjaro        Return in about 3 months (around 06/27/2023) for CPE, as scheduled.      I, Shirlee Latch, MD, have reviewed all documentation  for this visit. The documentation on 03/27/23 for the exam, diagnosis, procedures, and orders are all accurate and complete.   Alvaretta Eisenberger, Marzella Schlein, MD, MPH [facility]

## 2023-03-27 ENCOUNTER — Ambulatory Visit: Payer: Managed Care, Other (non HMO) | Admitting: Family Medicine

## 2023-03-27 ENCOUNTER — Encounter: Payer: Self-pay | Admitting: Family Medicine

## 2023-03-27 VITALS — BP 122/78 | HR 68 | Temp 97.8°F | Resp 24 | Ht 65.0 in | Wt 253.2 lb

## 2023-03-27 DIAGNOSIS — E1169 Type 2 diabetes mellitus with other specified complication: Secondary | ICD-10-CM | POA: Diagnosis not present

## 2023-03-27 DIAGNOSIS — Z6841 Body Mass Index (BMI) 40.0 and over, adult: Secondary | ICD-10-CM | POA: Diagnosis not present

## 2023-03-27 LAB — POCT GLYCOSYLATED HEMOGLOBIN (HGB A1C)
Est. average glucose Bld gHb Est-mCnc: 146
Hemoglobin A1C: 6.7 % — AB (ref 4.0–5.6)

## 2023-03-27 NOTE — Assessment & Plan Note (Signed)
Well controlled Continue mounjaro at current dose Consider dose titration for further weight loss in the future - patient hesitant currently

## 2023-03-27 NOTE — Assessment & Plan Note (Signed)
Congratulated on weight loss (5lbs on home scale) Discussed importance of healthy weight management Discussed diet and exercise

## 2023-03-27 NOTE — Assessment & Plan Note (Signed)
As above - continue mounjaro

## 2023-03-28 ENCOUNTER — Ambulatory Visit
Admission: RE | Admit: 2023-03-28 | Discharge: 2023-03-28 | Disposition: A | Discharge status: Home / Self Care | Payer: Managed Care, Other (non HMO) | Source: Ambulatory Visit | Attending: Urgent Care | Admitting: Urgent Care

## 2023-03-28 VITALS — BP 164/91 | HR 72 | Temp 98.0°F | Resp 18

## 2023-03-28 DIAGNOSIS — R519 Headache, unspecified: Secondary | ICD-10-CM

## 2023-03-28 MED ORDER — KETOROLAC TROMETHAMINE 30 MG/ML IJ SOLN
30.0000 mg | Freq: Once | INTRAMUSCULAR | Status: AC
  Administered 2023-03-28: 30 mg via INTRAMUSCULAR

## 2023-03-28 MED ORDER — DEXAMETHASONE SODIUM PHOSPHATE 10 MG/ML IJ SOLN
10.0000 mg | Freq: Once | INTRAMUSCULAR | Status: AC
  Administered 2023-03-28: 10 mg via INTRAMUSCULAR
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
## 2023-03-28 NOTE — ED Triage Notes (Signed)
Patient presents to UC for HA since 4 days, Hx of migraines. States pain is on the left side only. States left side of her face felt "funny." Reports a pins and needle sensation. Took tylenol. Hx of HTN. Dizziness when changing positions.  Denies changes to her vision or numbness to extremities.

## 2023-03-28 NOTE — Discharge Instructions (Signed)
Recommend follow-up with your primary care provider to evaluate the recent change in your typical headache symptoms.

## 2023-04-13 ENCOUNTER — Ambulatory Visit: Payer: Managed Care, Other (non HMO) | Admitting: Family Medicine

## 2023-04-13 ENCOUNTER — Encounter: Payer: Self-pay | Admitting: Family Medicine

## 2023-04-13 VITALS — BP 126/79 | HR 75 | Temp 97.6°F | Resp 12 | Wt 253.0 lb

## 2023-04-13 DIAGNOSIS — R519 Headache, unspecified: Secondary | ICD-10-CM | POA: Diagnosis not present

## 2023-04-13 MED ORDER — GABAPENTIN 100 MG PO CAPS: 100.0000 mg | ORAL_CAPSULE | Freq: Three times a day (TID) | ORAL | 3 refills | Status: DC

## 2023-04-13 NOTE — Progress Notes (Unsigned)
Acute Office Visit  Subjective:     Patient ID: Angela Hodges, female    DOB: [birth year], 67 y.o.   MRN: [id number]  Chief Complaint  Patient presents with   Headache    HPI Patient is in today for 2-3 day long headaches on the left side of her head every weekend for the past month. Dull pain from the nape of her neck up to her forehead and extends to behind her left eye. She also notes a "zapping" sensation over her left temple and behind her left eye that lasts for a few seconds at a time that occurs randomly. She has a history of migraines, but says this is not consistent with her normal migraine symptoms. She also has a history of degenerative joint disease of the cervical spine and thinks this may be connected. She denies pain when chewing or opening and closing her jaw.   She went to urgent care. They said it was cluster headaches and gave her Toradol and steroids. This did not improve her symptoms. She has had cluster headaches before and states that this headache feels different from her previous cluster headaches.     Review of Systems  Constitutional:  Negative for chills, fever and malaise/fatigue.  Eyes:  Positive for pain. Negative for blurred vision, double vision, photophobia, discharge and redness.  Respiratory:  Negative for shortness of breath.   Cardiovascular:  Negative for chest pain and palpitations.  Musculoskeletal:  Positive for neck pain. Negative for falls.  Neurological:  Positive for headaches. Negative for dizziness, tingling, sensory change, speech change and weakness.        Objective:    BP 126/79 (BP Location: Left Arm, Patient Position: Sitting, Cuff Size: Large)   Pulse 75   Temp 97.6 F (36.4 C) (Temporal)   Resp 12   Wt 253 lb (114.8 kg)   BMI 42.10 kg/m  {Vitals History (Optional):23777}  Physical Exam Constitutional:      General: She is not in acute distress.    Appearance: She is not ill-appearing.  HENT:     Head:  Normocephalic and atraumatic.     Jaw: No tenderness or pain on movement.     Comments: Tender at the nape of her neck on the left and behind her left ear.     Left Ear: External ear normal.     Mouth/Throat:     Mouth: Mucous membranes are moist.     Pharynx: Oropharynx is clear.  Eyes:     General: No scleral icterus.    Extraocular Movements: Extraocular movements intact.     Pupils: Pupils are equal, round, and reactive to light.  Cardiovascular:     Pulses: Normal pulses.     Heart sounds: Normal heart sounds.     Comments: No carotid bruits. Pulmonary:     Effort: Pulmonary effort is normal.     Breath sounds: Normal breath sounds.  Musculoskeletal:     Cervical back: Normal range of motion and neck supple. No rigidity.  Lymphadenopathy:     Cervical: No cervical adenopathy.  Skin:    General: Skin is warm and dry.     Capillary Refill: Capillary refill takes less than 2 seconds.     Findings: No rash.  Neurological:     Mental Status: She is alert.     Cranial Nerves: No dysarthria or facial asymmetry.     Sensory: No sensory deficit.     Gait: Gait normal.  Psychiatric:  Mood and Affect: Mood normal.        Behavior: Behavior normal.     No results found for any visits on 04/13/23.      Assessment & Plan:   Problem List Items Addressed This Visit   None Visit Diagnoses     Left facial pain    -  Primary   Relevant Orders   Sed Rate (ESR)   Ambulatory referral to Neurology       Meds ordered this encounter  Medications   gabapentin (NEURONTIN) 100 MG capsule    Sig: Take 1 capsule (100 mg total) by mouth 3 (three) times daily.    Dispense:  90 capsule    Refill:  3   1. Left facial pain Pan distribution from the nape of the neck to the left temple and behind the left eye, description of dull pain with episodes of sharp pain, and tenderness on palpation increases  suspicion of giant cell arteritis (GCA), trigeminal neuralgia, or progression  of degenerative joint disease of the cervical spine.  ESR ordered at this visit to assess for GCA. Referral to neurology to evaluate for possible trigeminal neuralgia with potential need for MRI imaging.  Started gabapentin 100 mg TID.  - Sed Rate (ESR) - Ambulatory referral to Neurology   Patient advised to contact the office or return if symptoms do not improve or worsen.   No follow-ups on file.  Ezekiel Slocumb, MS3 [facility]

## 2023-04-14 LAB — SEDIMENTATION RATE: Sed Rate: 5 mm/hr (ref 0–40)

## 2023-04-15 ENCOUNTER — Other Ambulatory Visit: Payer: Self-pay | Admitting: Family Medicine

## 2023-04-15 DIAGNOSIS — Z1231 Encounter for screening mammogram for malignant neoplasm of breast: Secondary | ICD-10-CM

## 2023-05-13 ENCOUNTER — Encounter: Payer: Self-pay | Admitting: Family Medicine

## 2023-05-13 DIAGNOSIS — E1169 Type 2 diabetes mellitus with other specified complication: Secondary | ICD-10-CM

## 2023-05-14 MED ORDER — TIRZEPATIDE 5 MG/0.5ML ~~LOC~~ SOAJ: 5.0000 mg | SUBCUTANEOUS | 3 refills | Status: DC

## 2023-05-16 ENCOUNTER — Other Ambulatory Visit: Payer: Self-pay | Admitting: Family Medicine

## 2023-05-16 DIAGNOSIS — I10 Essential (primary) hypertension: Secondary | ICD-10-CM

## 2023-05-18 NOTE — Telephone Encounter (Signed)
Requested Prescriptions  Pending Prescriptions Disp Refills   atenolol (TENORMIN) 25 MG tablet [Pharmacy Med Name: Atenolol 25 MG Oral Tablet] 90 tablet 0    Sig: TAKE 1 TABLET BY MOUTH DAILY     Cardiovascular: Beta Blockers 2 Passed - 05/16/2023 11:03 PM      Passed - Cr in normal range and within 360 days    Creatinine  Date Value Ref Range Status  07/25/2014 0.59 (L) 0.60 - 1.30 mg/dL Final   Creatinine, Ser  Date Value Ref Range Status  12/18/2022 0.93 0.57 - 1.00 mg/dL Final         Passed - Last BP in normal range    BP Readings from Last 1 Encounters:  04/13/23 126/79         Passed - Last Heart Rate in normal range    Pulse Readings from Last 1 Encounters:  04/13/23 75         Passed - Valid encounter within last 6 months    Recent Outpatient Visits           1 month ago Left facial pain   [facility] [facility] [place], Marzella Schlein, MD   1 month ago Type 2 diabetes mellitus with other specified complication, without long-term current use of insulin (HCC)   [place] [facility] [place], Marzella Schlein, MD   5 months ago Type 2 diabetes mellitus with other specified complication, without long-term current use of insulin (HCC)   [place] [facility] [place], Marzella Schlein, MD   6 months ago Severe sleep apnea   [place] [facility] [name], PA-C   8 months ago Acute bilateral low back pain without sciatica   [place] [facility] [name], PA-C       Future Appointments             In 1 month Bacigalupo, Marzella Schlein, MD [facility]             valsartan-hydrochlorothiazide (DIOVAN-HCT) 320-25 MG tablet [Pharmacy Med Name: Valsartan-hydroCHLOROthiazide 320-25 MG Oral Tablet] 90 tablet 0    Sig: TAKE 1 TABLET BY MOUTH DAILY     Cardiovascular: ARB + Diuretic Combos Passed - 05/16/2023 11:03 PM      Passed - K in normal range and  within 180 days    Potassium  Date Value Ref Range Status  12/18/2022 4.5 3.5 - 5.2 mmol/L Final  07/25/2014 5.0 3.5 - 5.1 mmol/L Final         Passed - Na in normal range and within 180 days    Sodium  Date Value Ref Range Status  12/18/2022 136 134 - 144 mmol/L Final  07/25/2014 140 136 - 145 mmol/L Final         Passed - Cr in normal range and within 180 days    Creatinine  Date Value Ref Range Status  07/25/2014 0.59 (L) 0.60 - 1.30 mg/dL Final   Creatinine, Ser  Date Value Ref Range Status  12/18/2022 0.93 0.57 - 1.00 mg/dL Final         Passed - eGFR is 10 or above and within 180 days    EGFR (African American)  Date Value Ref Range Status  07/25/2014 >60  Final   GFR calc Af Amer  Date Value Ref Range Status  08/21/2020 >60 >60 mL/min Final   EGFR (Non-African Amer.)  Date Value Ref Range Status  07/25/2014 >60  Final    Comment:    eGFR values <69mL/min/1.73 m2 may be an indication of chronic kidney disease (CKD). Calculated eGFR is useful in patients with stable renal function. The eGFR calculation will not be reliable in acutely ill patients when serum creatinine is changing rapidly. It is not useful in  patients on dialysis. The eGFR calculation may not be applicable to patients at the low and high extremes of body sizes, pregnant women, and vegetarians.    GFR, Estimated  Date Value Ref Range Status  10/17/2021 >60 >60 mL/min Final    Comment:    (NOTE) Calculated using the CKD-EPI Creatinine Equation (2021)    eGFR  Date Value Ref Range Status  12/18/2022 69 >59 mL/min/1.73 Final         Passed - Patient is not pregnant      Passed - Last BP in normal range    BP Readings from Last 1 Encounters:  04/13/23 126/79         Passed - Valid encounter within last 6 months    Recent Outpatient Visits           1 month ago Left facial pain   [place] [facility] [place], Marzella Schlein, MD   1 month ago Type 2 diabetes  mellitus with other specified complication, without long-term current use of insulin [facility])   [place] [facility] [place], Marzella Schlein, MD   5 months ago Type 2 diabetes mellitus with other specified complication, without long-term current use of insulin [facility])   [place] [facility] [place], Marzella Schlein, MD   6 months ago Severe sleep apnea   [place] [facility] [name], PA-C   8 months ago Acute bilateral low back pain without sciatica   [facility] [name], PA-C       Future Appointments             In 1 month Bacigalupo, Marzella Schlein, MD [facility]

## 2023-06-01 ENCOUNTER — Ambulatory Visit
Admission: RE | Admit: 2023-06-01 | Discharge: 2023-06-01 | Disposition: A | Discharge status: Home / Self Care | Payer: Managed Care, Other (non HMO) | Source: Ambulatory Visit | Attending: Family Medicine | Admitting: Family Medicine

## 2023-06-01 DIAGNOSIS — Z1231 Encounter for screening mammogram for malignant neoplasm of breast: Secondary | ICD-10-CM | POA: Diagnosis present

## 2023-06-02 ENCOUNTER — Other Ambulatory Visit: Payer: Self-pay | Admitting: Family Medicine

## 2023-06-02 DIAGNOSIS — R921 Mammographic calcification found on diagnostic imaging of breast: Secondary | ICD-10-CM

## 2023-06-02 DIAGNOSIS — R928 Other abnormal and inconclusive findings on diagnostic imaging of breast: Secondary | ICD-10-CM

## 2023-06-08 ENCOUNTER — Ambulatory Visit
Admission: RE | Admit: 2023-06-08 | Discharge: 2023-06-08 | Disposition: A | Discharge status: Home / Self Care | Payer: Managed Care, Other (non HMO) | Source: Ambulatory Visit | Attending: Family Medicine | Admitting: Family Medicine

## 2023-06-08 DIAGNOSIS — R921 Mammographic calcification found on diagnostic imaging of breast: Secondary | ICD-10-CM | POA: Diagnosis present

## 2023-06-08 DIAGNOSIS — R928 Other abnormal and inconclusive findings on diagnostic imaging of breast: Secondary | ICD-10-CM | POA: Diagnosis not present

## 2023-06-09 ENCOUNTER — Other Ambulatory Visit: Payer: Self-pay | Admitting: Family Medicine

## 2023-06-09 DIAGNOSIS — R928 Other abnormal and inconclusive findings on diagnostic imaging of breast: Secondary | ICD-10-CM

## 2023-06-09 DIAGNOSIS — R921 Mammographic calcification found on diagnostic imaging of breast: Secondary | ICD-10-CM

## 2023-06-16 ENCOUNTER — Encounter: Payer: Self-pay | Admitting: Family Medicine

## 2023-06-19 ENCOUNTER — Ambulatory Visit: Payer: Managed Care, Other (non HMO) | Admitting: Family Medicine

## 2023-06-24 ENCOUNTER — Ambulatory Visit
Admission: RE | Admit: 2023-06-24 | Discharge: 2023-06-24 | Disposition: A | Discharge status: Home / Self Care | Payer: Managed Care, Other (non HMO) | Source: Ambulatory Visit | Attending: Physician Assistant

## 2023-06-24 ENCOUNTER — Ambulatory Visit
Admission: RE | Admit: 2023-06-24 | Discharge: 2023-06-24 | Disposition: A | Discharge status: Home / Self Care | Payer: Managed Care, Other (non HMO) | Source: Ambulatory Visit | Attending: Physician Assistant | Admitting: Physician Assistant

## 2023-06-24 DIAGNOSIS — R928 Other abnormal and inconclusive findings on diagnostic imaging of breast: Secondary | ICD-10-CM | POA: Diagnosis present

## 2023-06-24 DIAGNOSIS — R921 Mammographic calcification found on diagnostic imaging of breast: Secondary | ICD-10-CM | POA: Insufficient documentation

## 2023-06-24 HISTORY — PX: BREAST BIOPSY: SHX20

## 2023-06-24 MED ORDER — LIDOCAINE 1 % OPTIME INJ - NO CHARGE
5.0000 mL | Freq: Once | INTRAMUSCULAR | Status: AC
  Administered 2023-06-24: 5 mL
  Filled 2023-06-24: qty 6

## 2023-06-24 MED ORDER — LIDOCAINE-EPINEPHRINE 1 %-1:100000 IJ SOLN
20.0000 mL | Freq: Once | INTRAMUSCULAR | Status: AC
  Administered 2023-06-24: 20 mL
  Filled 2023-06-24: qty 20

## 2023-07-03 ENCOUNTER — Ambulatory Visit: Payer: Managed Care, Other (non HMO) | Admitting: Family Medicine

## 2023-07-03 ENCOUNTER — Encounter: Payer: Self-pay | Admitting: Family Medicine

## 2023-07-03 VITALS — BP 125/80 | HR 79 | Temp 98.1°F | Resp 12 | Ht 65.0 in | Wt 253.3 lb

## 2023-07-03 DIAGNOSIS — Z0000 Encounter for general adult medical examination without abnormal findings: Secondary | ICD-10-CM | POA: Diagnosis not present

## 2023-07-03 DIAGNOSIS — E1159 Type 2 diabetes mellitus with other circulatory complications: Secondary | ICD-10-CM | POA: Diagnosis not present

## 2023-07-03 DIAGNOSIS — Z780 Asymptomatic menopausal state: Secondary | ICD-10-CM | POA: Diagnosis not present

## 2023-07-03 DIAGNOSIS — E1169 Type 2 diabetes mellitus with other specified complication: Secondary | ICD-10-CM

## 2023-07-03 DIAGNOSIS — I152 Hypertension secondary to endocrine disorders: Secondary | ICD-10-CM

## 2023-07-03 DIAGNOSIS — E785 Hyperlipidemia, unspecified: Secondary | ICD-10-CM

## 2023-07-03 MED ORDER — TIRZEPATIDE 7.5 MG/0.5ML ~~LOC~~ SOAJ: 7.5000 mg | SUBCUTANEOUS | 2 refills | Status: DC

## 2023-07-03 NOTE — Assessment & Plan Note (Signed)
Mounjaro increase as above Discussed importance of healthy weight management Discussed diet and exercise

## 2023-07-03 NOTE — Assessment & Plan Note (Signed)
Concerns about Ozempic and potential eye side effects. Not currently taking this A1c and fasting blood glucose to be checked. -Continue Mounjaro 5mg  weekly until current supply is finished. -Increase Mounjaro to 7.5mg  weekly after current supply is finished. Prescription sent to pharmacy to hold until patient requests it.

## 2023-07-03 NOTE — Assessment & Plan Note (Signed)
On Crestor 5mg  daily. -Check cholesterol levels today. Goal LDL<70
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
## 2023-07-03 NOTE — Assessment & Plan Note (Signed)
Well controlled with current regimen. -Continue Atenolol and Valsartan HCTZ.

## 2023-07-04 LAB — COMPREHENSIVE METABOLIC PANEL
ALT: 21 IU/L (ref 0–32)
AST: 20 IU/L (ref 0–40)
Albumin: 4.3 g/dL (ref 3.9–4.9)
Alkaline Phosphatase: 53 IU/L (ref 44–121)
BUN/Creatinine Ratio: 20 (ref 12–28)
BUN: 19 mg/dL (ref 8–27)
Bilirubin Total: 0.3 mg/dL (ref 0.0–1.2)
CO2: 25 mmol/L (ref 20–29)
Calcium: 11 mg/dL — ABNORMAL HIGH (ref 8.7–10.3)
Chloride: 102 mmol/L (ref 96–106)
Creatinine, Ser: 0.94 mg/dL (ref 0.57–1.00)
Globulin, Total: 2 g/dL (ref 1.5–4.5)
Glucose: 98 mg/dL (ref 70–99)
Potassium: 4.4 mmol/L (ref 3.5–5.2)
Sodium: 142 mmol/L (ref 134–144)
Total Protein: 6.3 g/dL (ref 6.0–8.5)
eGFR: 67 mL/min/{1.73_m2} (ref >59)

## 2023-07-04 LAB — LIPID PANEL WITH LDL/HDL RATIO
Cholesterol, Total: 144 mg/dL (ref 100–199)
HDL: 62 mg/dL (ref >39)
LDL Chol Calc (NIH): 61 mg/dL (ref 0–99)
LDL/HDL Ratio: 1 ratio (ref 0.0–3.2)
Triglycerides: 118 mg/dL (ref 0–149)
VLDL Cholesterol Cal: 21 mg/dL (ref 5–40)

## 2023-07-04 LAB — HEMOGLOBIN A1C
Est. average glucose Bld gHb Est-mCnc: 131 mg/dL
Hgb A1c MFr Bld: 6.2 % — ABNORMAL HIGH (ref 4.8–5.6)

## 2023-07-04 LAB — MICROALBUMIN / CREATININE URINE RATIO
Creatinine, Urine: 289.3 mg/dL
Microalb/Creat Ratio: 8 mg/g creat (ref 0–29)
Microalbumin, Urine: 24 ug/mL

## 2023-07-07 ENCOUNTER — Other Ambulatory Visit: Payer: Self-pay | Admitting: Family Medicine

## 2023-07-07 DIAGNOSIS — I10 Essential (primary) hypertension: Secondary | ICD-10-CM

## 2023-07-13 ENCOUNTER — Encounter: Payer: Self-pay | Admitting: Family Medicine

## 2023-07-14 MED ORDER — TIRZEPATIDE 7.5 MG/0.5ML ~~LOC~~ SOAJ: 7.5000 mg | SUBCUTANEOUS | 2 refills | Status: DC

## 2023-07-23 ENCOUNTER — Other Ambulatory Visit: Payer: Self-pay | Admitting: Family Medicine

## 2023-07-24 NOTE — Telephone Encounter (Signed)
Requested Prescriptions  Pending Prescriptions Disp Refills   atenolol (TENORMIN) 25 MG tablet [Pharmacy Med Name: Atenolol 25 MG Oral Tablet] 90 tablet 1    Sig: TAKE 1 TABLET BY MOUTH DAILY     Cardiovascular: Beta Blockers 2 Passed - 07/23/2023 10:06 PM      Passed - Cr in normal range and within 360 days    Creatinine  Date Value Ref Range Status  07/25/2014 0.59 (L) 0.60 - 1.30 mg/dL Final   Creatinine, Ser  Date Value Ref Range Status  07/03/2023 0.94 0.57 - 1.00 mg/dL Final         Passed - Last BP in normal range    BP Readings from Last 1 Encounters:  07/03/23 125/80         Passed - Last Heart Rate in normal range    Pulse Readings from Last 1 Encounters:  07/03/23 79         Passed - Valid encounter within last 6 months    Recent Outpatient Visits           3 weeks ago Encounter for annual physical exam   [facility] [place], Marzella Schlein, MD   3 months ago Left facial pain   [facility] [place], Marzella Schlein, MD   3 months ago Type 2 diabetes mellitus with other specified complication, without long-term current use of insulin [facility])   [facility] [place], Marzella Schlein, MD   7 months ago Type 2 diabetes mellitus with other specified complication, without long-term current use of insulin [facility])    [facility] [place], Marzella Schlein, MD   9 months ago Severe sleep apnea   [facility] [name], PA-C       Future Appointments             In 5 months Bacigalupo, Marzella Schlein, MD [facility]

## 2023-08-19 ENCOUNTER — Ambulatory Visit: Payer: Self-pay

## 2023-08-19 ENCOUNTER — Ambulatory Visit
Admission: RE | Admit: 2023-08-19 | Discharge: 2023-08-19 | Disposition: A | Discharge status: Home / Self Care | Payer: Managed Care, Other (non HMO) | Source: Ambulatory Visit | Attending: Family Medicine | Admitting: Family Medicine

## 2023-08-19 VITALS — BP 151/90 | HR 78 | Temp 98.2°F | Resp 16 | Ht 65.0 in | Wt 253.0 lb

## 2023-08-19 DIAGNOSIS — Z1152 Encounter for screening for COVID-19: Secondary | ICD-10-CM | POA: Diagnosis not present

## 2023-08-19 DIAGNOSIS — Z20822 Contact with and (suspected) exposure to covid-19: Secondary | ICD-10-CM

## 2023-08-19 DIAGNOSIS — B9789 Other viral agents as the cause of diseases classified elsewhere: Secondary | ICD-10-CM

## 2023-08-19 DIAGNOSIS — R109 Unspecified abdominal pain: Secondary | ICD-10-CM

## 2023-08-19 DIAGNOSIS — J988 Other specified respiratory disorders: Secondary | ICD-10-CM

## 2023-08-19 LAB — CBC WITH DIFFERENTIAL/PLATELET
Abs Immature Granulocytes: 0.06 10*3/uL (ref 0.00–0.07)
Basophils Absolute: 0 10*3/uL (ref 0.0–0.1)
Basophils Relative: 0 %
Eosinophils Absolute: 0.3 10*3/uL (ref 0.0–0.5)
Eosinophils Relative: 3 %
HCT: 41.5 % (ref 36.0–46.0)
Hemoglobin: 13.5 g/dL (ref 12.0–15.0)
Immature Granulocytes: 1 %
Lymphocytes Relative: 26 %
Lymphs Abs: 2.5 10*3/uL (ref 0.7–4.0)
MCH: 30.8 pg (ref 26.0–34.0)
MCHC: 32.5 g/dL (ref 30.0–36.0)
MCV: 94.7 fL (ref 80.0–100.0)
Monocytes Absolute: 0.7 10*3/uL (ref 0.1–1.0)
Monocytes Relative: 7 %
Neutro Abs: 6.3 10*3/uL (ref 1.7–7.7)
Neutrophils Relative %: 63 %
Platelets: 287 10*3/uL (ref 150–400)
RBC: 4.38 MIL/uL (ref 3.87–5.11)
RDW: 14 % (ref 11.5–15.5)
WBC: 10 10*3/uL (ref 4.0–10.5)
nRBC: 0 % (ref 0.0–0.2)

## 2023-08-19 LAB — URINALYSIS, W/ REFLEX TO CULTURE (INFECTION SUSPECTED)
Glucose, UA: NEGATIVE mg/dL
Hgb urine dipstick: NEGATIVE
Ketones, ur: 15 mg/dL — AB
Leukocytes,Ua: NEGATIVE
Nitrite: NEGATIVE
Protein, ur: NEGATIVE mg/dL
Specific Gravity, Urine: 1.025 (ref 1.005–1.030)
pH: 5.5 (ref 5.0–8.0)

## 2023-08-19 LAB — COMPREHENSIVE METABOLIC PANEL
ALT: 30 U/L (ref 0–44)
AST: 20 U/L (ref 15–41)
Albumin: 4 g/dL (ref 3.5–5.0)
Alkaline Phosphatase: 46 U/L (ref 38–126)
Anion gap: 8 (ref 5–15)
BUN: 36 mg/dL — ABNORMAL HIGH (ref 8–23)
CO2: 25 mmol/L (ref 22–32)
Calcium: 9.2 mg/dL (ref 8.9–10.3)
Chloride: 102 mmol/L (ref 98–111)
Creatinine, Ser: 1.13 mg/dL — ABNORMAL HIGH (ref 0.44–1.00)
GFR, Estimated: 54 mL/min — ABNORMAL LOW (ref >60)
Glucose, Bld: 102 mg/dL — ABNORMAL HIGH (ref 70–99)
Potassium: 3.8 mmol/L (ref 3.5–5.1)
Sodium: 135 mmol/L (ref 135–145)
Total Bilirubin: 0.5 mg/dL (ref 0.3–1.2)
Total Protein: 7 g/dL (ref 6.5–8.1)

## 2023-08-19 LAB — SARS CORONAVIRUS 2 BY RT PCR: SARS Coronavirus 2 by RT PCR: NEGATIVE

## 2023-08-19 NOTE — Discharge Instructions (Addendum)
Have your primary care doctor repeat your kidney function tests at your next visit to be sure it returns to your baseline. Drink plenty of fluids to flush your kidneys.   Your COVID test is negative.  Your urine did not show evidence of a urinary tract infection nor was there any blood to suggest a kidney stone.  Your blood work did not show any evidence of a widespread infection.

## 2023-08-19 NOTE — Telephone Encounter (Signed)
     Chief Complaint: Fatigue. "I just don't feel good." Has left side pain that started today. Symptoms: Above Frequency: 3 days ago, has not been able to work. Had clammy skin yesterday. Pertinent Negatives: Patient denies fever Disposition: [] ED /[] Urgent Care (no appt availability in office) / [x] Appointment(In office/virtual)/ []  [place] [place] [facility]/ [] Home Care/ [] Refused Recommended Disposition /[] [facility]/ []  Follow-up with PCP Additional Notes: Pt. Agrees with appointment tomorrow.  Reason for Disposition  [1] MODERATE weakness (i.e., interferes with work, school, normal activities) AND [2] persists > 3 days  Answer Assessment - Initial Assessment Questions 1. DESCRIPTION: "Describe how you are feeling."     Tired 2. SEVERITY: "How bad is it?"  "Can you stand and walk?"   - MILD (0-3): Feels weak or tired, but does not interfere with work, school or normal activities.   - MODERATE (4-7): Able to stand and walk; weakness interferes with work, school, or normal activities.   - SEVERE (8-10): Unable to stand or walk; unable to do usual activities.     Mild 3. ONSET: "When did these symptoms begin?" (e.g., hours, days, weeks, months)     3 days ago 4. CAUSE: "What do you think is causing the weakness or fatigue?" (e.g., not drinking enough fluids, medical problem, trouble sleeping)     Unsure 5. NEW MEDICINES:  "Have you started on any new medicines recently?" (e.g., opioid pain medicines, benzodiazepines, muscle relaxants, antidepressants, antihistamines, neuroleptics, beta blockers)     Had Prednisone last week 6. OTHER SYMPTOMS: "Do you have any other symptoms?" (e.g., chest pain, fever, cough, SOB, vomiting, diarrhea, bleeding, other areas of pain)     Had clammy skin 7. PREGNANCY: "Is there any chance you are pregnant?" "When was your last menstrual period?"     No  Protocols used: Weakness (Generalized) and Fatigue-A-AH

## 2023-08-19 NOTE — ED Triage Notes (Signed)
Pt c/o L sided flank pain & fatigue ongoing since this AM. States Sunday she did feel clammy & have a sore throat which has subsided. Denies any urinary sx's. Has tried tylenol w/o relief.
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
## 2023-08-20 ENCOUNTER — Ambulatory Visit: Payer: Managed Care, Other (non HMO) | Admitting: Family Medicine

## 2023-09-08 ENCOUNTER — Telehealth: Payer: Managed Care, Other (non HMO) | Admitting: Family Medicine

## 2023-09-08 ENCOUNTER — Encounter: Payer: Self-pay | Admitting: Family Medicine

## 2023-09-08 DIAGNOSIS — R7989 Other specified abnormal findings of blood chemistry: Secondary | ICD-10-CM | POA: Diagnosis not present

## 2023-09-08 DIAGNOSIS — H8110 Benign paroxysmal vertigo, unspecified ear: Secondary | ICD-10-CM | POA: Diagnosis not present

## 2023-09-08 MED ORDER — MECLIZINE HCL 25 MG PO TABS: 25.0000 mg | ORAL_TABLET | Freq: Three times a day (TID) | ORAL | 1 refills | Status: DC | PRN

## 2023-09-08 MED ORDER — ONDANSETRON HCL 4 MG PO TABS: 4.0000 mg | ORAL_TABLET | Freq: Three times a day (TID) | ORAL | 0 refills | Status: DC | PRN

## 2023-09-08 NOTE — Patient Instructions (Signed)
Call [facility] to schedule your bone density [phone]

## 2023-09-08 NOTE — Progress Notes (Signed)
MyChart Video Visit    Virtual Visit via Video Note   This format is felt to be most appropriate for this patient at this time. Physical exam was limited by quality of the video and audio technology used for the visit.    Patient location: home Provider location: [facility] Persons involved in the visit: patient, provider   I discussed the limitations of evaluation and management by telemedicine and the availability of in person appointments. The patient expressed understanding and agreed to proceed.  Patient: Angela Hodges   DOB: Sep 17, 1956   67 y.o. Female  MRN: [id number] Visit Date: 09/08/2023  Today's healthcare provider: Shirlee Latch, MD   Chief Complaint  Patient presents with   Dizziness    Patient states not feeling well for a few weeks. Reports that she has been nauseous  with the dizziness. Overall reports that she has been feeling very ill. States her head hurts when reads. Reports that she  went to the walk in Clinic in [place].   Subjective    Dizziness   HPI     Dizziness    Additional comments: Patient states not feeling well for a few weeks. Reports that she has been nauseous  with the dizziness. Overall reports that she has been feeling very ill. States her head hurts when reads. Reports that she  went to the walk in Clinic in [place].      Last edited by Marjie Skiff, CMA on 09/08/2023  9:07 AM.      Discussed the use of AI scribe software for clinical note transcription with the patient, who gave verbal consent to proceed.  History of Present Illness   The patient, with a history of vertigo, presents with worsening dizziness over the past three weeks. They describe episodes of severe dizziness, to the point of needing to brace themselves against a wall due to the room spinning. The dizziness has been so severe that it has caused them to feel physically ill and has impacted their ability to work. They have been taking  meclizine, which provides temporary relief, but the dizziness returns.  In addition to the dizziness, the patient has been experiencing lower back pain. They recently visited a walk-in clinic for an upper respiratory infection and were told that their kidney function did not look good. They have not had any follow-up testing for their kidney function since that visit.  The patient also mentions that they have not yet had a bone density test that was previously ordered. They received a notification that the test was complete, but they have not actually had the test done.        Medications: Outpatient Medications Prior to Visit  Medication Sig   aspirin 81 MG tablet Take 1 tablet by mouth daily.   atenolol (TENORMIN) 25 MG tablet TAKE 1 TABLET BY MOUTH DAILY   Cholecalciferol 1000 UNITS capsule Take 1 capsule by mouth 3 (three) times daily.   fluticasone (FLONASE) 50 MCG/ACT nasal spray Place 2 sprays into both nostrils daily.   gabapentin (NEURONTIN) 100 MG capsule Take 1 capsule (100 mg total) by mouth 3 (three) times daily.   hydroquinone 4 % cream Apply topically daily.   meloxicam (MOBIC) 15 MG tablet Take 15 mg by mouth daily.   methocarbamol (ROBAXIN) 500 MG tablet Take 1 tablet 3 times a day by oral route.   Omega-3 Fatty Acids (FISH OIL) 1200 MG CAPS Take by mouth.   rosuvastatin (CRESTOR) 5  MG tablet TAKE 1 TABLET BY MOUTH DAILY   tirzepatide (MOUNJARO) 7.5 MG/0.5ML Pen Inject 7.5 mg into the skin once a week.   valACYclovir (VALTREX) 1000 MG tablet TAKE 1 TABLET BY MOUTH DAILY   valsartan-hydrochlorothiazide (DIOVAN-HCT) 320-25 MG tablet TAKE 1 TABLET BY MOUTH DAILY   [DISCONTINUED] meclizine (ANTIVERT) 25 MG tablet Take 1 tablet (25 mg total) by mouth 3 (three) times daily as needed for dizziness.   No facility-administered medications prior to visit.    Review of Systems  Neurological:  Positive for dizziness.   per HPI      Objective    There were no vitals taken  for this visit.      Physical Exam Constitutional:      General: She is not in acute distress.    Appearance: Normal appearance.  HENT:     Head: Normocephalic.  Pulmonary:     Effort: Pulmonary effort is normal. No respiratory distress.  Neurological:     Mental Status: She is alert and oriented to person, place, and time. Mental status is at baseline.        Assessment & Plan     Problem List Items Addressed This Visit   None Visit Diagnoses     Benign paroxysmal positional vertigo, unspecified laterality    -  Primary   Elevated serum creatinine       Relevant Orders   Basic Metabolic Panel (BMET)           Vertigo Persistent dizziness for the past three weeks, with episodes of severe vertigo causing nausea and vomiting. Patient has been using Meclizine with some relief, but symptoms recur. -Refill Meclizine prescription. - zofran prn for nausea associated with vertigo -Send patient instructions for Epley maneuver via MyChart for home management of vertigo. -Consider referral to vestibular therapy if home maneuvers are ineffective.  Renal function Recent abnormal kidney function tests noted during urgent care visit for upper respiratory infection. -Repeat kidney function tests  Bone Density Test Patient reports not having completed previously ordered bone density test. -Provide patient with contact information for testing center and instruct patient to schedule bone density test.  General Health Maintenance -Documented receipt of flu shot at [facility] last week.        Return if symptoms worsen or fail to improve.     I discussed the assessment and treatment plan with the patient. The patient was provided an opportunity to ask questions and all were answered. The patient agreed with the plan and demonstrated an understanding of the instructions.   The patient was advised to call back or seek an in-person evaluation if the symptoms worsen or if the condition  fails to improve as anticipated.    Shirlee Latch, MD [facility] [phone] (phone) [phone] (fax)  [facility]

## 2023-09-19 LAB — BASIC METABOLIC PANEL
BUN/Creatinine Ratio: 17 (ref 12–28)
BUN: 19 mg/dL (ref 8–27)
CO2: 23 mmol/L (ref 20–29)
Calcium: 9.8 mg/dL (ref 8.7–10.3)
Chloride: 100 mmol/L (ref 96–106)
Creatinine, Ser: 1.11 mg/dL — ABNORMAL HIGH (ref 0.57–1.00)
Glucose: 102 mg/dL — ABNORMAL HIGH (ref 70–99)
Potassium: 4.8 mmol/L (ref 3.5–5.2)
Sodium: 139 mmol/L (ref 134–144)
eGFR: 55 mL/min/{1.73_m2} — ABNORMAL LOW (ref >59)

## 2023-09-29 ENCOUNTER — Ambulatory Visit
Admission: RE | Admit: 2023-09-29 | Discharge: 2023-09-29 | Disposition: A | Discharge status: Home / Self Care | Payer: Managed Care, Other (non HMO) | Source: Ambulatory Visit | Attending: Family Medicine | Admitting: Family Medicine

## 2023-09-29 ENCOUNTER — Telehealth: Payer: Managed Care, Other (non HMO) | Admitting: Family Medicine

## 2023-09-29 ENCOUNTER — Encounter: Payer: Self-pay | Admitting: Family Medicine

## 2023-09-29 DIAGNOSIS — Z780 Asymptomatic menopausal state: Secondary | ICD-10-CM | POA: Diagnosis present

## 2023-09-29 DIAGNOSIS — Z0000 Encounter for general adult medical examination without abnormal findings: Secondary | ICD-10-CM | POA: Diagnosis present

## 2023-09-30 ENCOUNTER — Other Ambulatory Visit: Payer: Self-pay

## 2023-09-30 DIAGNOSIS — N289 Disorder of kidney and ureter, unspecified: Secondary | ICD-10-CM

## 2023-10-02 ENCOUNTER — Encounter: Payer: Self-pay | Admitting: Family Medicine

## 2023-10-02 ENCOUNTER — Telehealth: Payer: Managed Care, Other (non HMO) | Admitting: Family Medicine

## 2023-10-02 DIAGNOSIS — E1169 Type 2 diabetes mellitus with other specified complication: Secondary | ICD-10-CM | POA: Diagnosis not present

## 2023-10-02 DIAGNOSIS — I152 Hypertension secondary to endocrine disorders: Secondary | ICD-10-CM

## 2023-10-02 DIAGNOSIS — E1159 Type 2 diabetes mellitus with other circulatory complications: Secondary | ICD-10-CM | POA: Diagnosis not present

## 2023-10-02 DIAGNOSIS — R7989 Other specified abnormal findings of blood chemistry: Secondary | ICD-10-CM

## 2023-10-02 DIAGNOSIS — E785 Hyperlipidemia, unspecified: Secondary | ICD-10-CM

## 2023-10-02 MED ORDER — EMPAGLIFLOZIN 10 MG PO TABS
10.0000 mg | ORAL_TABLET | Freq: Every day | ORAL | 2 refills | Status: DC
Start: 2023-10-02 — End: 2023-12-04

## 2023-10-02 NOTE — Progress Notes (Signed)
MyChart Video Visit    Virtual Visit via Video Note   This format is felt to be most appropriate for this patient at this time. Physical exam was limited by quality of the video and audio technology used for the visit.    Patient location: home Provider location: [facility] Persons involved in the visit: patient, provider  I discussed the limitations of evaluation and management by telemedicine and the availability of in person appointments. The patient expressed understanding and agreed to proceed.  Patient: Angela Hodges   DOB: 01/12/1956   67 y.o. Female  MRN: [id number] Visit Date: 10/02/2023  Today's healthcare provider: Shirlee Latch, MD   No chief complaint on file.  Subjective    HPI   Discussed the use of AI scribe software for clinical note transcription with the patient, who gave verbal consent to proceed.  History of Present Illness   A 67 year old patient with a history of hypertension, hyperlipidemia, type two diabetes, and morbid obesity presents with concerns about her current diabetes medication, Mounjaro. The patient reports feeling unwell since the dosage was increased to 7.5mg  weekly. Symptoms include a general feeling of malaise and a lack of energy, leading to decreased physical activity. The patient also reports not experiencing the expected weight loss from the medication. The patient has been taking methocarbamol for back pain. The patient expresses a desire to stop taking Mounjaro and try a different medication.         Review of Systems per HPI      Objective    There were no vitals taken for this visit.      Physical Exam Constitutional:      General: She is not in acute distress.    Appearance: Normal appearance.  HENT:     Head: Normocephalic.  Pulmonary:     Effort: Pulmonary effort is normal. No respiratory distress.  Neurological:     Mental Status: She is alert and oriented to person, place, and  time. Mental status is at baseline.        Assessment & Plan     Problem List Items Addressed This Visit       Cardiovascular and Mediastinum   Hypertension associated with diabetes (HCC)   Relevant Medications   empagliflozin (JARDIANCE) 10 MG TABS tablet     Endocrine   T2DM (type 2 diabetes mellitus) (HCC) - Primary    Patient reports feeling unwell since increasing Mounjaro to 7.5mg  weekly. Despite good glycemic control (A1c 6.2), patient prefers to discontinue Mounjaro due to side effects and lack of weight loss. -Discontinue Mounjaro. -Start Jardiance 10mg  daily - discussed options of lifestyle management, metformin and jardiance -Recheck A1c in 3 months.      Relevant Medications   empagliflozin (JARDIANCE) 10 MG TABS tablet   Hyperlipidemia associated with type 2 diabetes mellitus (HCC)   Relevant Medications   empagliflozin (JARDIANCE) 10 MG TABS tablet   Other Visit Diagnoses     Elevated serum creatinine               Renal Function Mild elevation in creatinine from baseline, possibly due to dehydration secondary to [facility] side effects. -Check renal function in 1 month after discontinuation of Mounjaro.  Hypertension and Hyperlipidemia Well controlled on current medications. -Continue Atenolol 25mg  daily, Crestor 5mg  daily, and Valcyte HCTZ 3.25-25mg  daily.  General Health Maintenance -Continue Aspirin 81mg  daily for cardiovascular risk reduction.          Return in  about 3 months (around 01/02/2024) for as scheduled.     I discussed the assessment and treatment plan with the patient. The patient was provided an opportunity to ask questions and all were answered. The patient agreed with the plan and demonstrated an understanding of the instructions.   The patient was advised to call back or seek an in-person evaluation if the symptoms worsen or if the condition fails to improve as anticipated.  Shirlee Latch, MD [facility] [phone] (phone) [phone] (fax)  [facility]

## 2023-10-02 NOTE — Assessment & Plan Note (Signed)
Patient reports feeling unwell since increasing Mounjaro to 7.5mg  weekly. Despite good glycemic control (A1c 6.2), patient prefers to discontinue Mounjaro due to side effects and lack of weight loss. -Discontinue Mounjaro. -Start Jardiance 10mg  daily - discussed options of lifestyle management, metformin and jardiance -Recheck A1c in 3 months.

## 2023-11-07 LAB — BASIC METABOLIC PANEL
BUN/Creatinine Ratio: 19 (ref 12–28)
BUN: 16 mg/dL (ref 8–27)
CO2: 25 mmol/L (ref 20–29)
Calcium: 10.5 mg/dL — ABNORMAL HIGH (ref 8.7–10.3)
Chloride: 102 mmol/L (ref 96–106)
Creatinine, Ser: 0.84 mg/dL (ref 0.57–1.00)
Glucose: 108 mg/dL — ABNORMAL HIGH (ref 70–99)
Potassium: 4.6 mmol/L (ref 3.5–5.2)
Sodium: 142 mmol/L (ref 134–144)
eGFR: 77 mL/min/{1.73_m2} (ref >59)

## 2023-11-11 ENCOUNTER — Encounter: Payer: Self-pay | Admitting: Family Medicine

## 2023-11-17 ENCOUNTER — Other Ambulatory Visit: Payer: Self-pay | Admitting: Family Medicine

## 2023-11-17 ENCOUNTER — Other Ambulatory Visit: Payer: Self-pay

## 2023-11-17 DIAGNOSIS — N289 Disorder of kidney and ureter, unspecified: Secondary | ICD-10-CM

## 2023-11-18 NOTE — Telephone Encounter (Signed)
Requested Prescriptions  Pending Prescriptions Disp Refills   valACYclovir (VALTREX) 1000 MG tablet [Pharmacy Med Name: valACYclovir HCl 1 GM Oral Tablet] 90 tablet 1    Sig: TAKE 1 TABLET BY MOUTH DAILY     Antimicrobials:  Antiviral Agents - Anti-Herpetic Passed - 11/17/2023  1:40 AM      Passed - Valid encounter within last 12 months    Recent Outpatient Visits           1 month ago Type 2 diabetes mellitus with other specified complication, without long-term current use of insulin (HCC)   [place] [facility], Marzella Schlein, MD   2 months ago Benign paroxysmal positional vertigo, unspecified laterality   [place] [facility], Marzella Schlein, MD   4 months ago Encounter for annual physical exam   [place] [facility], Marzella Schlein, MD   7 months ago Left facial pain   [place] [facility], Marzella Schlein, MD   7 months ago Type 2 diabetes mellitus with other specified complication, without long-term current use of insulin [facility])    [facility], Marzella Schlein, MD       Future Appointments             In 1 month Bacigalupo, Marzella Schlein, MD [facility]

## 2023-11-20 ENCOUNTER — Other Ambulatory Visit: Payer: Self-pay | Admitting: Family Medicine

## 2023-11-20 DIAGNOSIS — E7800 Pure hypercholesterolemia, unspecified: Secondary | ICD-10-CM

## 2023-12-01 ENCOUNTER — Other Ambulatory Visit: Payer: Self-pay | Admitting: Family Medicine

## 2023-12-02 ENCOUNTER — Encounter: Payer: Self-pay | Admitting: Family Medicine

## 2023-12-02 DIAGNOSIS — Z0279 Encounter for issue of other medical certificate: Secondary | ICD-10-CM

## 2023-12-03 NOTE — Telephone Encounter (Signed)
Requested medication (s) are due for refill today: na   Requested medication (s) are on the active medication list: yes   Last refill:  10/02/23 #30 2 refills  Future visit scheduled: yes in 1 month  Notes to clinic:    Pharmacy comment: Alternative Requested:[facility] REQUIRED.       Requested Prescriptions  Pending Prescriptions Disp Refills   JARDIANCE 10 MG TABS tablet [Pharmacy Med Name: JARDIANCE 10 MG TABLET] 30 tablet 2    Sig: TAKE 1 TABLET BY MOUTH DAILY BEFORE BREAKFAST.     Endocrinology:  Diabetes - SGLT2 Inhibitors Passed - 12/03/2023  3:05 PM      Passed - Cr in normal range and within 360 days    Creatinine  Date Value Ref Range Status  07/25/2014 0.59 (L) 0.60 - 1.30 mg/dL Final   Creatinine, Ser  Date Value Ref Range Status  11/06/2023 0.84 0.57 - 1.00 mg/dL Final         Passed - HBA1C is between 0 and 7.9 and within 180 days    Hgb A1c MFr Bld  Date Value Ref Range Status  07/03/2023 6.2 (H) 4.8 - 5.6 % Final    Comment:             Prediabetes: 5.7 - 6.4          Diabetes: >6.4          Glycemic control for adults with diabetes: <7.0          Passed - eGFR in normal range and within 360 days    EGFR (African American)  Date Value Ref Range Status  07/25/2014 >60  Final   GFR calc Af Amer  Date Value Ref Range Status  08/21/2020 >60 >60 mL/min Final   EGFR (Non-African Amer.)  Date Value Ref Range Status  07/25/2014 >60  Final    Comment:    eGFR values <20mL/min/1.73 m2 may be an indication of chronic kidney disease (CKD). Calculated eGFR is useful in patients with stable renal function. The eGFR calculation will not be reliable in acutely ill patients when serum creatinine is changing rapidly. It is not useful in  patients on dialysis. The eGFR calculation may not be applicable to patients at the low and high extremes of body sizes, pregnant women, and vegetarians.    GFR, Estimated  Date Value Ref Range Status   08/19/2023 54 (L) >60 mL/min Final    Comment:    (NOTE) Calculated using the CKD-EPI Creatinine Equation (2021)    eGFR  Date Value Ref Range Status  11/06/2023 77 >59 mL/min/1.73 Final         Passed - Valid encounter within last 6 months    Recent Outpatient Visits           2 months ago Type 2 diabetes mellitus with other specified complication, without long-term current use of insulin (HCC)   [place] [facility] [place], Marzella Schlein, MD   2 months ago Benign paroxysmal positional vertigo, unspecified laterality   [place] [facility] [place], Marzella Schlein, MD   5 months ago Encounter for annual physical exam   [place] [facility] [place], Marzella Schlein, MD   7 months ago Left facial pain   [place] [facility] Erasmo Downer, MD   8 months ago Type 2 diabetes mellitus with other specified complication, without long-term current use of insulin [facility])   [place] [facility] [place], [place]  M, MD       Future Appointments             In 1 month Bacigalupo, Marzella Schlein, MD [facility], [facility]

## 2023-12-04 ENCOUNTER — Encounter: Payer: Self-pay | Admitting: Family Medicine

## 2023-12-04 ENCOUNTER — Telehealth: Payer: Self-pay | Admitting: Family Medicine

## 2023-12-04 ENCOUNTER — Telehealth (INDEPENDENT_AMBULATORY_CARE_PROVIDER_SITE_OTHER): Payer: Managed Care, Other (non HMO) | Admitting: Family Medicine

## 2023-12-04 DIAGNOSIS — E119 Type 2 diabetes mellitus without complications: Secondary | ICD-10-CM | POA: Diagnosis not present

## 2023-12-04 DIAGNOSIS — Z7984 Long term (current) use of oral hypoglycemic drugs: Secondary | ICD-10-CM | POA: Diagnosis not present

## 2023-12-04 DIAGNOSIS — E1169 Type 2 diabetes mellitus with other specified complication: Secondary | ICD-10-CM

## 2023-12-04 MED ORDER — METFORMIN HCL 500 MG PO TABS
500.0000 mg | ORAL_TABLET | Freq: Two times a day (BID) | ORAL | 3 refills | Status: DC
Start: 2023-12-04 — End: 2024-01-28

## 2023-12-04 NOTE — Assessment & Plan Note (Signed)
Reports elevated morning blood glucose levels (150 mg/dL) on Jardiance, compared to previous levels (120-130 mg/dL) on Mounjaro. Concerned about hair loss potentially related to current medication. Influenced by husband's, sister's, and brother's positive experiences with metformin. Discussed safety, efficacy, and cost-effectiveness of metformin, including immediate vs. extended-release formulations. Prefers immediate-release metformin. Goal dose: 1000 mg twice daily, starting with 500 mg twice daily to assess tolerance. - Discontinue Jardiance - Initiate metformin 500 mg twice daily - Follow-up visit next month to assess response and perform A1c test

## 2023-12-04 NOTE — Progress Notes (Signed)
MyChart Video Visit    Virtual Visit via Video Note   This format is felt to be most appropriate for this patient at this time. Physical exam was limited by quality of the video and audio technology used for the visit.    Patient location: home Provider location: [facility] Persons involved in the visit: patient, provider  I discussed the limitations of evaluation and management by telemedicine and the availability of in person appointments. The patient expressed understanding and agreed to proceed.  Patient: Angela Hodges   DOB: [birth year]   67 y.o. Female  MRN: [id number] Visit Date: 12/04/2023  Today's healthcare provider: Shirlee Latch, MD   No chief complaint on file.  Subjective    HPI   Discussed the use of AI scribe software for clinical note transcription with the patient, who gave verbal consent to proceed.  History of Present Illness   The patient, with a history of diabetes, presents with concerns about elevated blood sugar levels and hair loss. They report that their morning blood sugar levels have been higher than usual, around 150, which is a significant increase from the usual 120-130 range they were accustomed to when taking Mounjaro. This increase in blood sugar levels has caused them significant anxiety.  In addition to the elevated blood sugar levels, the patient has also noticed hair loss, which they believe may be related to their current diabetes medication, Jardiance. They express a desire to switch to Metformin, a medication that their husband, sister, and brother are all taking with positive results. Their husband, in particular, has seen significant improvements in his blood sugar levels and has lost 30 pounds since starting Metformin.  The patient also mentions that they have been having issues with their pharmacy, [facility], which has been refusing to fill their Jardiance prescription. This has led them to consider switching  to Metformin sooner rather than later.        Review of Systems      Objective    There were no vitals taken for this visit.      Physical Exam Constitutional:      General: She is not in acute distress.    Appearance: Normal appearance.  HENT:     Head: Normocephalic.  Pulmonary:     Effort: Pulmonary effort is normal. No respiratory distress.  Neurological:     Mental Status: She is alert and oriented to person, place, and time. Mental status is at baseline.        Assessment & Plan     Problem List Items Addressed This Visit       Endocrine   T2DM (type 2 diabetes mellitus) (HCC) - Primary    Reports elevated morning blood glucose levels (150 mg/dL) on Jardiance, compared to previous levels (120-130 mg/dL) on Mounjaro. Concerned about hair loss potentially related to current medication. Influenced by husband's, sister's, and brother's positive experiences with metformin. Discussed safety, efficacy, and cost-effectiveness of metformin, including immediate vs. extended-release formulations. Prefers immediate-release metformin. Goal dose: 1000 mg twice daily, starting with 500 mg twice daily to assess tolerance. - Discontinue Jardiance - Initiate metformin 500 mg twice daily - Follow-up visit next month to assess response and perform A1c test      Relevant Medications   metFORMIN (GLUCOPHAGE) 500 MG tablet        Medication Disposal Inquires about proper disposal of unused Mounjaro medication. Discussed safe disposal methods to prevent misuse and accidental exposure.  Return in about 4 weeks (around 01/01/2024) for as scheduled.     I discussed the assessment and treatment plan with the patient. The patient was provided an opportunity to ask questions and all were answered. The patient agreed with the plan and demonstrated an understanding of the instructions.   The patient was advised to call back or seek an in-person evaluation if the symptoms  worsen or if the condition fails to improve as anticipated.   Shirlee Latch, MD [facility] [phone] (phone) [phone] (fax)  [facility]

## 2023-12-04 NOTE — Telephone Encounter (Signed)
 Care team updated and letter sent for eye exam notes.

## 2023-12-04 NOTE — Telephone Encounter (Signed)
[facility] in [place] is requesting a refill on the following medication   empagliflozin (JARDIANCE) 10 MG TABS tablet   Message from pharmacy:  insurance requires 90 day supply.  Please send new script ASAP.  Pt is out of the medication

## 2023-12-07 ENCOUNTER — Telehealth: Payer: Self-pay | Admitting: Family Medicine

## 2023-12-07 NOTE — Telephone Encounter (Signed)
Per the note from 12/03/23 with Dr. Burt Ek is discontinued. We will not fill this prescription at this time.

## 2023-12-07 NOTE — Telephone Encounter (Signed)
LVM- informed patient of FMLA paperwork was completed & faxed & copy left up front.

## 2023-12-08 ENCOUNTER — Other Ambulatory Visit: Payer: Self-pay | Admitting: Family Medicine

## 2023-12-08 DIAGNOSIS — E7800 Pure hypercholesterolemia, unspecified: Secondary | ICD-10-CM

## 2023-12-09 ENCOUNTER — Encounter: Payer: Self-pay | Admitting: Family Medicine

## 2023-12-09 NOTE — Telephone Encounter (Signed)
Atenolol- 07/24/23 #90 1RF- too soon Rosuvastatin- 02/10/23 #90 3RF- to soon Requested Prescriptions  Pending Prescriptions Disp Refills   atenolol (TENORMIN) 25 MG tablet [Pharmacy Med Name: Atenolol 25 MG Oral Tablet] 90 tablet 3    Sig: TAKE 1 TABLET BY MOUTH DAILY     Cardiovascular: Beta Blockers 2 Failed - 12/08/2023  4:28 PM      Failed - Last BP in normal range    BP Readings from Last 1 Encounters:  08/19/23 (!) 151/90         Passed - Cr in normal range and within 360 days    Creatinine  Date Value Ref Range Status  07/25/2014 0.59 (L) 0.60 - 1.30 mg/dL Final   Creatinine, Ser  Date Value Ref Range Status  11/06/2023 0.84 0.57 - 1.00 mg/dL Final         Passed - Last Heart Rate in normal range    Pulse Readings from Last 1 Encounters:  08/19/23 78         Passed - Valid encounter within last 6 months    Recent Outpatient Visits           5 days ago Type 2 diabetes mellitus with other specified complication, without long-term current use of insulin (HCC)   [facility] [place], Marzella Schlein, MD   2 months ago Type 2 diabetes mellitus with other specified complication, without long-term current use of insulin (HCC)   [place] [facility] [place], Marzella Schlein, MD   3 months ago Benign paroxysmal positional vertigo, unspecified laterality   [place] [facility] [place], Marzella Schlein, MD   5 months ago Encounter for annual physical exam   [place] [facility] [place], Marzella Schlein, MD   8 months ago Left facial pain   [place] [facility] [place], Marzella Schlein, MD       Future Appointments             In 1 month Bacigalupo, Marzella Schlein, MD [facility]             rosuvastatin (CRESTOR) 5 MG tablet [Pharmacy Med Name: Rosuvastatin Calcium 5 MG Oral Tablet] 90 tablet 3    Sig: TAKE 1 TABLET BY MOUTH DAILY     Cardiovascular:  Antilipid  - Statins 2 Failed - 12/08/2023  4:28 PM      Failed - Lipid Panel in normal range within the last 12 months    Cholesterol, Total  Date Value Ref Range Status  07/03/2023 144 100 - 199 mg/dL Final   LDL Chol Calc (NIH)  Date Value Ref Range Status  07/03/2023 61 0 - 99 mg/dL Final   HDL  Date Value Ref Range Status  07/03/2023 62 >39 mg/dL Final   Triglycerides  Date Value Ref Range Status  07/03/2023 118 0 - 149 mg/dL Final         Passed - Cr in normal range and within 360 days    Creatinine  Date Value Ref Range Status  07/25/2014 0.59 (L) 0.60 - 1.30 mg/dL Final   Creatinine, Ser  Date Value Ref Range Status  11/06/2023 0.84 0.57 - 1.00 mg/dL Final         Passed - Patient is not pregnant      Passed - Valid encounter within last 12 months    Recent Outpatient Visits           5 days  ago Type 2 diabetes mellitus with other specified complication, without long-term current use of insulin [facility])   [place] [facility] [place], Marzella Schlein, MD   2 months ago Type 2 diabetes mellitus with other specified complication, without long-term current use of insulin [facility])   [place] [facility] [place], Marzella Schlein, MD   3 months ago Benign paroxysmal positional vertigo, unspecified laterality   [place] [facility] [place], Marzella Schlein, MD   5 months ago Encounter for annual physical exam   [place] [facility] [place], Marzella Schlein, MD   8 months ago Left facial pain   [place] [facility] [place], Marzella Schlein, MD       Future Appointments             In 1 month Bacigalupo, Marzella Schlein, MD [facility], [facility]

## 2023-12-09 NOTE — Telephone Encounter (Signed)
Please print and start form and place in my box for completion.

## 2023-12-12 LAB — BASIC METABOLIC PANEL
BUN/Creatinine Ratio: 22 (ref 12–28)
BUN: 21 mg/dL (ref 8–27)
CO2: 23 mmol/L (ref 20–29)
Calcium: 9.7 mg/dL (ref 8.7–10.3)
Chloride: 99 mmol/L (ref 96–106)
Creatinine, Ser: 0.95 mg/dL (ref 0.57–1.00)
Glucose: 89 mg/dL (ref 70–99)
Potassium: 4.3 mmol/L (ref 3.5–5.2)
Sodium: 138 mmol/L (ref 134–144)
eGFR: 66 mL/min/{1.73_m2} (ref >59)

## 2023-12-12 LAB — PARATHYROID HORMONE, INTACT (NO CA): PTH: 15 pg/mL (ref 15–65)

## 2023-12-12 LAB — VITAMIN D 25 HYDROXY (VIT D DEFICIENCY, FRACTURES): Vit D, 25-Hydroxy: 50.7 ng/mL (ref 30.0–100.0)

## 2023-12-14 ENCOUNTER — Ambulatory Visit: Payer: Self-pay

## 2023-12-14 NOTE — Telephone Encounter (Signed)
Form has not been printed as link directs you to have one-time passcode that is sent to patient. Reply is sent to patient advising of this with screenshot.

## 2023-12-15 NOTE — Telephone Encounter (Signed)
Forms printed and provided to provider to sign

## 2023-12-16 ENCOUNTER — Encounter: Payer: Self-pay | Admitting: Family Medicine

## 2023-12-16 DIAGNOSIS — E1169 Type 2 diabetes mellitus with other specified complication: Secondary | ICD-10-CM

## 2023-12-21 ENCOUNTER — Ambulatory Visit
Admission: RE | Admit: 2023-12-21 | Discharge: 2023-12-21 | Disposition: A | Discharge status: Home / Self Care | Payer: Self-pay | Source: Ambulatory Visit | Attending: Family Medicine | Admitting: Family Medicine

## 2023-12-21 DIAGNOSIS — E785 Hyperlipidemia, unspecified: Secondary | ICD-10-CM | POA: Insufficient documentation

## 2023-12-21 DIAGNOSIS — E1169 Type 2 diabetes mellitus with other specified complication: Secondary | ICD-10-CM | POA: Insufficient documentation

## 2023-12-31 ENCOUNTER — Encounter: Payer: Self-pay | Admitting: Family Medicine

## 2024-01-01 ENCOUNTER — Other Ambulatory Visit: Payer: Self-pay | Admitting: Family Medicine

## 2024-01-05 NOTE — Telephone Encounter (Signed)
 Requested Prescriptions  Pending Prescriptions Disp Refills   atenolol  (TENORMIN ) 25 MG tablet [Pharmacy Med Name: Atenolol  25 MG Oral Tablet] 90 tablet 1    Sig: TAKE 1 TABLET BY MOUTH DAILY     Cardiovascular: Beta Blockers 2 Failed - 01/05/2024  8:25 AM      Failed - Last BP in normal range    BP Readings from Last 1 Encounters:  08/19/23 (!) 151/90         Passed - Cr in normal range and within 360 days    Creatinine  Date Value Ref Range Status  07/25/2014 0.59 (L) 0.60 - 1.30 mg/dL Final   Creatinine, Ser  Date Value Ref Range Status  12/11/2023 0.95 0.57 - 1.00 mg/dL Final         Passed - Last Heart Rate in normal range    Pulse Readings from Last 1 Encounters:  08/19/23 78         Passed - Valid encounter within last 6 months    Recent Outpatient Visits           1 month ago Type 2 diabetes mellitus with other specified complication, without long-term current use of insulin (HCC)   [place] [facility] [place], Jon HERO, MD   3 months ago Type 2 diabetes mellitus with other specified complication, without long-term current use of insulin [facility])   [place] [facility] [place], Jon HERO, MD   3 months ago Benign paroxysmal positional vertigo, unspecified laterality   [place] [facility] [place], Jon HERO, MD   6 months ago Encounter for annual physical exam   [place] [facility] [place], Jon HERO, MD   8 months ago Left facial pain   [place] [facility] [place], Jon HERO, MD       Future Appointments             In 3 days Bacigalupo, Jon HERO, MD [facility], [facility]

## 2024-01-08 ENCOUNTER — Ambulatory Visit: Payer: Managed Care, Other (non HMO) | Admitting: Family Medicine

## 2024-01-08 ENCOUNTER — Other Ambulatory Visit: Payer: Self-pay | Admitting: Family Medicine

## 2024-01-08 VITALS — BP 139/73 | HR 66 | Ht 65.0 in | Wt 255.0 lb

## 2024-01-08 DIAGNOSIS — I152 Hypertension secondary to endocrine disorders: Secondary | ICD-10-CM

## 2024-01-08 DIAGNOSIS — E1159 Type 2 diabetes mellitus with other circulatory complications: Secondary | ICD-10-CM | POA: Diagnosis not present

## 2024-01-08 DIAGNOSIS — G4730 Sleep apnea, unspecified: Secondary | ICD-10-CM

## 2024-01-08 DIAGNOSIS — E1169 Type 2 diabetes mellitus with other specified complication: Secondary | ICD-10-CM

## 2024-01-08 DIAGNOSIS — E7800 Pure hypercholesterolemia, unspecified: Secondary | ICD-10-CM

## 2024-01-08 DIAGNOSIS — E785 Hyperlipidemia, unspecified: Secondary | ICD-10-CM

## 2024-01-08 LAB — POCT GLYCOSYLATED HEMOGLOBIN (HGB A1C): Hemoglobin A1C: 6.6 % — AB (ref 4.0–5.6)

## 2024-01-08 NOTE — Assessment & Plan Note (Signed)
 Recent weight loss of 3 pounds. Actively managing weight through diet and physical activity. Metformin  contributing to appetite control. Discussed benefits of continued weight loss and potential need to change exercise routines to prevent adaptation. - Encourage continued weight loss efforts - Consider changing exercise routine

## 2024-01-08 NOTE — Assessment & Plan Note (Signed)
 Well-controlled with atenolol 25 mg daily and valsartan HCTZ 320/25 mg daily. Blood pressure consistently below 140 mmHg. - Continue current antihypertensive regimen

## 2024-01-08 NOTE — Assessment & Plan Note (Signed)
 Well-controlled with an A1c of 6.6% (previously 6.2%). Slight increase attributed to holiday dietary indulgences. On metformin  500 mg BID, aiding in weight loss and appetite control. Emphasized keeping A1c below 7% to prevent complications. - Continue metformin  500 mg BID - Follow-up in 6 months for physical, foot exam, and urine test

## 2024-01-08 NOTE — Assessment & Plan Note (Signed)
 Managed with CPAP. No new issues reported. - Continue CPAP management

## 2024-01-08 NOTE — Progress Notes (Signed)
 Established patient visit   Patient: Angela Hodges   DOB: 22-Jul-1956   68 y.o. Female  MRN: [id number] Visit Date: 01/08/2024  Today's healthcare provider: Jon Eva, MD   Chief Complaint  Patient presents with   Follow-up    6 months f/u   Subjective    HPI HPI     Follow-up    Additional comments: 6 months f/u      Last edited by Deitra Therisa HERO, CMA on 01/08/2024 10:47 AM.       Discussed the use of AI scribe software for clinical note transcription with the patient, who gave verbal consent to proceed.  History of Present Illness   The patient, a 68 year old with a history of type two diabetes, hypertension, hyperlipidemia, morbid obesity, and sleep apnea, presents for a chronic disease follow-up. The patient expresses concern about her cholesterol levels, particularly in comparison to her husband's. Despite her husband's less healthy diet, his cholesterol levels are lower. The patient has been taking Crestor  5mg  daily for hyperlipidemia and is tolerating it well.  The patient's diabetes is managed with metformin  500mg  twice daily. She reports a slight increase in her A1c levels, which she attributes to indulging in candy apples during the holiday season. However, she notes that metformin  has helped control her appetite, leading to a recent weight loss of three pounds.  The patient's hypertension is managed with atenolol  25mg  daily and valsartan  HCTZ 320-25mg  daily. She reports no issues with these medications.  The patient also has sleep apnea, but no specific concerns or issues related to this condition are discussed during the visit.         Medications: Outpatient Medications Prior to Visit  Medication Sig   aspirin 81 MG tablet Take 1 tablet by mouth daily.   atenolol  (TENORMIN ) 25 MG tablet TAKE 1 TABLET BY MOUTH DAILY   Cholecalciferol 1000 UNITS capsule Take 1 capsule by mouth 3 (three) times daily.   hydroquinone 4 % cream Apply topically  daily.   meloxicam  (MOBIC ) 15 MG tablet Take 15 mg by mouth daily.   metFORMIN  (GLUCOPHAGE ) 500 MG tablet Take 1 tablet (500 mg total) by mouth 2 (two) times daily with a meal.   methocarbamol (ROBAXIN) 500 MG tablet Take 1 tablet 3 times a day by oral route.   Omega-3 Fatty Acids (FISH OIL) 1200 MG CAPS Take by mouth.   rosuvastatin  (CRESTOR ) 5 MG tablet TAKE 1 TABLET BY MOUTH DAILY   valACYclovir  (VALTREX ) 1000 MG tablet TAKE 1 TABLET BY MOUTH DAILY   valsartan -hydrochlorothiazide  (DIOVAN -HCT) 320-25 MG tablet TAKE 1 TABLET BY MOUTH DAILY   [DISCONTINUED] fluticasone  (FLONASE ) 50 MCG/ACT nasal spray Place 2 sprays into both nostrils daily.   [DISCONTINUED] gabapentin  (NEURONTIN ) 100 MG capsule Take 1 capsule (100 mg total) by mouth 3 (three) times daily.   [DISCONTINUED] meclizine  (ANTIVERT ) 25 MG tablet Take 1 tablet (25 mg total) by mouth 3 (three) times daily as needed for dizziness.   [DISCONTINUED] ondansetron  (ZOFRAN ) 4 MG tablet Take 1 tablet (4 mg total) by mouth every 8 (eight) hours as needed for nausea or vomiting.   No facility-administered medications prior to visit.    Review of Systems     Objective    BP 139/73 (BP Location: Left Arm, Patient Position: Sitting, Cuff Size: Large)   Pulse 66   Ht 5' 5 (1.651 m)   Wt 255 lb (115.7 kg)   SpO2 98%   BMI 42.43 kg/m  Physical Exam Vitals reviewed.  Constitutional:      General: She is not in acute distress.    Appearance: Normal appearance. She is well-developed. She is not diaphoretic.  HENT:     Head: Normocephalic and atraumatic.  Eyes:     General: No scleral icterus.    Conjunctiva/sclera: Conjunctivae normal.  Neck:     Thyroid: No thyromegaly.  Cardiovascular:     Rate and Rhythm: Normal rate and regular rhythm.     Heart sounds: Normal heart sounds. No murmur heard. Pulmonary:     Effort: Pulmonary effort is normal. No respiratory distress.     Breath sounds: Normal breath sounds. No wheezing,  rhonchi or rales.  Musculoskeletal:     Cervical back: Neck supple.     Right lower leg: No edema.     Left lower leg: No edema.  Lymphadenopathy:     Cervical: No cervical adenopathy.  Skin:    General: Skin is warm and dry.     Findings: No rash.  Neurological:     Mental Status: She is alert and oriented to person, place, and time. Mental status is at baseline.  Psychiatric:        Mood and Affect: Mood normal.        Behavior: Behavior normal.      Results for orders placed or performed in visit on 01/08/24  POCT HgB A1C  Result Value Ref Range   Hemoglobin A1C 6.6 (A) 4.0 - 5.6 %   HbA1c POC (<> result, manual entry)     HbA1c, POC (prediabetic range)     HbA1c, POC (controlled diabetic range)      Assessment & Plan     Problem List Items Addressed This Visit       Cardiovascular and Mediastinum   Hypertension associated with diabetes (HCC)   Well-controlled with atenolol  25 mg daily and valsartan  HCTZ 320/25 mg daily. Blood pressure consistently below 140 mmHg. - Continue current antihypertensive regimen        Respiratory   Severe sleep apnea   Managed with CPAP. No new issues reported. - Continue CPAP management        Endocrine   T2DM (type 2 diabetes mellitus) (HCC) - Primary   Well-controlled with an A1c of 6.6% (previously 6.2%). Slight increase attributed to holiday dietary indulgences. On metformin  500 mg BID, aiding in weight loss and appetite control. Emphasized keeping A1c below 7% to prevent complications. - Continue metformin  500 mg BID - Follow-up in 6 months for physical, foot exam, and urine test      Relevant Orders   POCT HgB A1C (Completed)   Hyperlipidemia associated with type 2 diabetes mellitus (HCC)   Well-controlled with LDL at 61 mg/dL (target <29 mg/dL). On Crestor  5 mg daily. Discussed importance of not lowering LDL excessively for brain function. - Continue Crestor  5 mg daily - Continue baby aspirin for cardiovascular  prevention        Other   Morbid obesity (HCC)   Recent weight loss of 3 pounds. Actively managing weight through diet and physical activity. Metformin  contributing to appetite control. Discussed benefits of continued weight loss and potential need to change exercise routines to prevent adaptation. - Encourage continued weight loss efforts - Consider changing exercise routine           General Health Maintenance Up to date. Last eye exam showed no retinopathy. Discussed importance of annual eye exams for diabetes management. - Ensure annual eye exam  is documented - Schedule next visit in 6 months for physical and routine diabetes care.        Return in about 6 months (around 07/07/2024) for CPE.       Jon Eva, MD  [facility] [phone] (phone) [phone] (fax)  [facility]

## 2024-01-08 NOTE — Assessment & Plan Note (Signed)
 Well-controlled with LDL at 61 mg/dL (target <82 mg/dL). On Crestor 5 mg daily. Discussed importance of not lowering LDL excessively for brain function. - Continue Crestor 5 mg daily - Continue baby aspirin for cardiovascular prevention

## 2024-01-11 NOTE — Telephone Encounter (Signed)
 Requested Prescriptions  Pending Prescriptions Disp Refills   rosuvastatin  (CRESTOR ) 5 MG tablet [Pharmacy Med Name: Rosuvastatin  Calcium  5 MG Oral Tablet] 90 tablet 1    Sig: TAKE 1 TABLET BY MOUTH DAILY     Cardiovascular:  Antilipid - Statins 2 Failed - 01/11/2024  4:45 PM      Failed - Lipid Panel in normal range within the last 12 months    Cholesterol, Total  Date Value Ref Range Status  07/03/2023 144 100 - 199 mg/dL Final   LDL Chol Calc (NIH)  Date Value Ref Range Status  07/03/2023 61 0 - 99 mg/dL Final   HDL  Date Value Ref Range Status  07/03/2023 62 >39 mg/dL Final   Triglycerides  Date Value Ref Range Status  07/03/2023 118 0 - 149 mg/dL Final         Passed - Cr in normal range and within 360 days    Creatinine  Date Value Ref Range Status  07/25/2014 0.59 (L) 0.60 - 1.30 mg/dL Final   Creatinine, Ser  Date Value Ref Range Status  12/11/2023 0.95 0.57 - 1.00 mg/dL Final         Passed - Patient is not pregnant      Passed - Valid encounter within last 12 months    Recent Outpatient Visits           3 days ago Type 2 diabetes mellitus with other specified complication, without long-term current use of insulin (HCC)   [place] [facility] [place], Jon HERO, MD   1 month ago Type 2 diabetes mellitus with other specified complication, without long-term current use of insulin [facility])   [place] [facility] [place], Jon HERO, MD   3 months ago Type 2 diabetes mellitus with other specified complication, without long-term current use of insulin [facility])   [place] [facility] [place], Jon HERO, MD   4 months ago Benign paroxysmal positional vertigo, unspecified laterality   [place] [facility] [place], Jon HERO, MD   6 months ago Encounter for annual physical exam   [place] [facility] [place], Jon HERO, MD       Future Appointments             In 5  months Bacigalupo, Jon HERO, MD [facility]

## 2024-01-22 ENCOUNTER — Encounter: Payer: Self-pay | Admitting: Family Medicine

## 2024-01-28 ENCOUNTER — Other Ambulatory Visit: Payer: Self-pay

## 2024-01-28 MED ORDER — METFORMIN HCL 500 MG PO TABS: 500.0000 mg | ORAL_TABLET | Freq: Two times a day (BID) | ORAL | 3 refills | Status: DC

## 2024-02-25 ENCOUNTER — Ambulatory Visit: Payer: Self-pay | Admitting: Family Medicine

## 2024-02-25 ENCOUNTER — Ambulatory Visit
Admission: EM | Admit: 2024-02-25 | Discharge: 2024-02-25 | Disposition: A | Discharge status: Home / Self Care | Payer: Managed Care, Other (non HMO) | Attending: Emergency Medicine | Admitting: Emergency Medicine

## 2024-02-25 ENCOUNTER — Encounter: Payer: Self-pay | Admitting: Emergency Medicine

## 2024-02-25 ENCOUNTER — Telehealth: Payer: Self-pay | Admitting: Primary Care

## 2024-02-25 DIAGNOSIS — J069 Acute upper respiratory infection, unspecified: Secondary | ICD-10-CM

## 2024-02-25 MED ORDER — PROMETHAZINE-DM 6.25-15 MG/5ML PO SYRP: 5.0000 mL | ORAL_SOLUTION | Freq: Four times a day (QID) | ORAL | 0 refills | Status: DC | PRN

## 2024-02-25 MED ORDER — BENZONATATE 100 MG PO CAPS: 200.0000 mg | ORAL_CAPSULE | Freq: Three times a day (TID) | ORAL | 0 refills | Status: DC

## 2024-02-25 MED ORDER — DOXYCYCLINE HYCLATE 100 MG PO CAPS: 100.0000 mg | ORAL_CAPSULE | Freq: Two times a day (BID) | ORAL | 0 refills | Status: AC

## 2024-02-25 MED ORDER — IPRATROPIUM BROMIDE 0.06 % NA SOLN: 2.0000 | Freq: Four times a day (QID) | NASAL | 12 refills | Status: DC

## 2024-02-25 NOTE — Telephone Encounter (Signed)
 Any availability with any provider tomorrow?

## 2024-02-25 NOTE — Discharge Instructions (Addendum)
 Take the doxycycline 100 mg twice daily with food for 7 days for treatment of your upper respiratory infection.  Use over-the-counter Tylenol and/or ibuprofen according the pack instructions as needed for any fever or pain.  Use the Atrovent nasal spray, 2 squirts in each nostril every 6 hours, as needed for runny nose and postnasal drip.  Use the Tessalon Perles every 8 hours during the day.  Take them with a small sip of water.  They may give you some numbness to the base of your tongue or a metallic taste in your mouth, this is normal.  Use the Promethazine DM cough syrup at bedtime for cough and congestion.  It will make you drowsy so do not take it during the day.  Return for reevaluation or see your primary care provider for any new or worsening symptoms.

## 2024-02-25 NOTE — ED Triage Notes (Signed)
 Pt c/o some cough, nausea, fatigue x 1 week. For the past 3 days she has had an upset stomach.

## 2024-02-25 NOTE — Telephone Encounter (Signed)
 My chart message came in for an appointment. Pt has a runny nose cough very tired
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
## 2024-02-25 NOTE — Telephone Encounter (Signed)
 Red Word that prompted transfer to Nurse Triage: patient stated she has been feeling weak for about a week, runny nose and coughing. She is also having diarrhea                 Chief Complaint: Mild cough,runny nose . Fatigue and nausea are worse. Symptoms: Above Frequency: 1 week Pertinent Negatives: Patient denies fever Disposition: [] ED /[x] Urgent Care (no appt availability in office) / [] Appointment(In office/virtual)/ []  [place] [facility]/ [] Home Care/ [] Refused Recommended Disposition /[] [place] [facility]/ []  Follow-up with PCP Additional Notes: No availability in the practice. Declines to be seen in another office,going to UC.  Reason for Disposition  [1] MODERATE weakness (i.e., interferes with work, school, normal activities) AND [2] persists > 3 days  Answer Assessment - Initial Assessment Questions 1. DESCRIPTION: "Describe how you are feeling."     Fatigue, mild cough, runny nose 2. SEVERITY: "How bad is it?"  "Can you stand and walk?"   - MILD (0-3): Feels weak or tired, but does not interfere with work, school or normal activities.   - MODERATE (4-7): Able to stand and walk; weakness interferes with work, school, or normal activities.   - SEVERE (8-10): Unable to stand or walk; unable to do usual activities.     Moderate 3. ONSET: "When did these symptoms begin?" (e.g., hours, days, weeks, months)     1 week 4. CAUSE: "What do you think is causing the weakness or fatigue?" (e.g., not drinking enough fluids, medical problem, trouble sleeping)     Husband is sick 5. NEW MEDICINES:  "Have you started on any new medicines recently?" (e.g., opioid pain medicines, benzodiazepines, muscle relaxants, antidepressants, antihistamines, neuroleptics, beta blockers)     No 6. OTHER SYMPTOMS: "Do you have any other symptoms?" (e.g., chest pain, fever, cough, SOB, vomiting, diarrhea, bleeding, other areas of pain)     Nausea 7. PREGNANCY: "Is there any chance you are  pregnant?" "When was your last menstrual period?"     No  Protocols used: Weakness (Generalized) and Fatigue-A-AH

## 2024-02-25 NOTE — Telephone Encounter (Signed)
 I spoke with the patient. She saw Urgent Care today and was treated for her cough.  Nothing further needed.

## 2024-02-26 NOTE — Telephone Encounter (Signed)
 Checked in with patient and she reports she was able to be seen yesterday at [facility]

## 2024-02-26 NOTE — Telephone Encounter (Signed)
 Looks like she was going to [facility]

## 2024-03-01 ENCOUNTER — Other Ambulatory Visit: Payer: Self-pay | Admitting: Family Medicine

## 2024-03-02 NOTE — Telephone Encounter (Signed)
 Rx 11/18/23 #90 1RF- too soon Requested Prescriptions  Pending Prescriptions Disp Refills   valACYclovir (VALTREX) 1000 MG tablet [Pharmacy Med Name: valACYclovir HCl 1 GM Oral Tablet] 90 tablet 3    Sig: TAKE 1 TABLET BY MOUTH DAILY     Antimicrobials:  Antiviral Agents - Anti-Herpetic Passed - 03/02/2024 11:37 AM      Passed - Valid encounter within last 12 months    Recent Outpatient Visits           1 month ago Type 2 diabetes mellitus with other specified complication, without long-term current use of insulin (HCC)   [place] [facility] [place], Marzella Schlein, MD   2 months ago Type 2 diabetes mellitus with other specified complication, without long-term current use of insulin [facility])   [place] [facility] [place], Marzella Schlein, MD   5 months ago Type 2 diabetes mellitus with other specified complication, without long-term current use of insulin [facility])   [place] [facility] [place], Marzella Schlein, MD   5 months ago Benign paroxysmal positional vertigo, unspecified laterality   [place] [facility] [place], Marzella Schlein, MD   8 months ago Encounter for annual physical exam    [facility] [place], Marzella Schlein, MD       Future Appointments             In 4 months Bacigalupo, Marzella Schlein, MD [facility]

## 2024-04-12 ENCOUNTER — Other Ambulatory Visit: Payer: Self-pay | Admitting: Family Medicine

## 2024-04-12 DIAGNOSIS — E7800 Pure hypercholesterolemia, unspecified: Secondary | ICD-10-CM

## 2024-04-24 ENCOUNTER — Encounter: Payer: Self-pay | Admitting: Family Medicine

## 2024-05-02 ENCOUNTER — Telehealth: Admitting: Family Medicine

## 2024-05-05 ENCOUNTER — Telehealth: Admitting: Family Medicine

## 2024-05-05 ENCOUNTER — Encounter: Payer: Self-pay | Admitting: Family Medicine

## 2024-05-05 DIAGNOSIS — Z7984 Long term (current) use of oral hypoglycemic drugs: Secondary | ICD-10-CM | POA: Diagnosis not present

## 2024-05-05 DIAGNOSIS — E119 Type 2 diabetes mellitus without complications: Secondary | ICD-10-CM

## 2024-05-05 DIAGNOSIS — Z7985 Long-term (current) use of injectable non-insulin antidiabetic drugs: Secondary | ICD-10-CM

## 2024-05-05 DIAGNOSIS — Z6841 Body Mass Index (BMI) 40.0 and over, adult: Secondary | ICD-10-CM

## 2024-05-05 DIAGNOSIS — E1169 Type 2 diabetes mellitus with other specified complication: Secondary | ICD-10-CM

## 2024-05-05 MED ORDER — TIRZEPATIDE 2.5 MG/0.5ML ~~LOC~~ SOAJ: 2.5000 mg | SUBCUTANEOUS | 0 refills | Status: DC

## 2024-05-05 MED ORDER — TIRZEPATIDE 5 MG/0.5ML ~~LOC~~ SOAJ: 5.0000 mg | SUBCUTANEOUS | 1 refills | Status: DC

## 2024-05-05 NOTE — Progress Notes (Signed)
 MyChart Video Visit    Virtual Visit via Video Note   This format is felt to be most appropriate for this patient at this time. Physical exam was limited by quality of the video and audio technology used for the visit.    Patient location: home Provider location: [facility] Persons involved in the visit: patient, provider  I discussed the limitations of evaluation and management by telemedicine and the availability of in person appointments. The patient expressed understanding and agreed to proceed.  Patient: Angela Hodges   DOB: 04/22/1956   68 y.o. Female  MRN: [id number] Visit Date: 05/05/2024  Today's healthcare provider: Aden Agreste, MD   No chief complaint on file.  Subjective    HPI   Discussed the use of AI scribe software for clinical note transcription with the patient, who gave verbal consent to proceed.  History of Present Illness   Angela Hodges is a 68 year old female with type 2 diabetes who presents with medication side effects and management concerns.  She experiences significant fatigue and lethargy with Mounjaro , especially at the 7 mg dose, feeling weak and exhausted after work. Her body responds well to the medication, but the fatigue is overwhelming. She takes metformin  500 mg twice daily, which causes severe heartburn. Her husband experiences similar symptoms with metformin . She previously tried Jardiance , which was not well tolerated.  Her last A1c was 6.6. She is interested in weight management and has considered medications like Ozempic and Wegovy, noting a friend's success with XBMWUX. She is concerned about the cost and insurance coverage for her medications. She has no gastrointestinal issues other than heartburn with metformin .         Review of Systems      Objective    There were no vitals taken for this visit.      Physical Exam Constitutional:      General: She is not in acute distress.     Appearance: Normal appearance.  HENT:     Head: Normocephalic.  Pulmonary:     Effort: Pulmonary effort is normal. No respiratory distress.  Neurological:     Mental Status: She is alert and oriented to person, place, and time. Mental status is at baseline.        Assessment & Plan     Problem List Items Addressed This Visit       Endocrine   T2DM (type 2 diabetes mellitus) (HCC) - Primary   Type 2 diabetes mellitus with recent A1c of 6.6%. Previous treatment with Mounjaro  was effective at lower doses but caused significant fatigue at higher doses. Metformin  caused heartburn. She is interested in weight management and glycemic control. Discussed potential benefits of Mounjaro  over Ozempic, including better weight loss, A1c reduction, and stomach tolerability. She prefers Mounjaro  due to previous positive experiences at lower doses. If Ozempic is ineffective, a slower titration of Mounjaro  may be considered. - Discontinue metformin . - Initiate Mounjaro  at 2.5 mg once weekly for 4 weeks, then increase to 5 mg once weekly. - Send prescription for Mounjaro  to [facility] with instructions to fill 2.5 mg first, followed by 5 mg. - Plan for a 90-day supply of 5 mg Mounjaro  after initial 30-day supply of 2.5 mg.      Relevant Medications   tirzepatide  (MOUNJARO ) 2.5 MG/0.5ML Pen   tirzepatide  (MOUNJARO ) 5 MG/0.5ML Pen     Other   BMI 40.0-44.9, adult (HCC)   Relevant Medications   tirzepatide  (MOUNJARO ) 2.5  MG/0.5ML Pen   tirzepatide  (MOUNJARO ) 5 MG/0.5ML Pen        Heartburn due to metformin  Heartburn associated with metformin  use. She reports significant heartburn with metformin , which is also experienced by her husband. Prefers to discontinue metformin  in favor of Mounjaro , which previously provided effective glycemic control without metformin . - Discontinue metformin .       Meds ordered this encounter  Medications   tirzepatide  (MOUNJARO ) 2.5 MG/0.5ML Pen    Sig: Inject 2.5 mg  into the skin once a week.    Dispense:  2 mL    Refill:  0   tirzepatide  (MOUNJARO ) 5 MG/0.5ML Pen    Sig: Inject 5 mg into the skin once a week.    Dispense:  6 mL    Refill:  1    Fill after the 2.5 mg dose     Return in about 2 months (around 07/05/2024) for as scheduled.     I discussed the assessment and treatment plan with the patient. The patient was provided an opportunity to ask questions and all were answered. The patient agreed with the plan and demonstrated an understanding of the instructions.   The patient was advised to call back or seek an in-person evaluation if the symptoms worsen or if the condition fails to improve as anticipated.   Aden Agreste, MD [facility] [phone] (phone) [phone] (fax)  [facility]

## 2024-05-05 NOTE — Assessment & Plan Note (Signed)
 Type 2 diabetes mellitus with recent A1c of 6.6%. Previous treatment with Mounjaro  was effective at lower doses but caused significant fatigue at higher doses. Metformin  caused heartburn. She is interested in weight management and glycemic control. Discussed potential benefits of Mounjaro  over Ozempic, including better weight loss, A1c reduction, and stomach tolerability. She prefers Mounjaro  due to previous positive experiences at lower doses. If Ozempic is ineffective, a slower titration of Mounjaro  may be considered. - Discontinue metformin . - Initiate Mounjaro  at 2.5 mg once weekly for 4 weeks, then increase to 5 mg once weekly. - Send prescription for Mounjaro  to [facility] with instructions to fill 2.5 mg first, followed by 5 mg. - Plan for a 90-day supply of 5 mg Mounjaro  after initial 30-day supply of 2.5 mg.

## 2024-05-11 ENCOUNTER — Telehealth: Payer: Self-pay

## 2024-05-11 ENCOUNTER — Other Ambulatory Visit: Payer: Self-pay | Admitting: Family Medicine

## 2024-05-11 NOTE — Telephone Encounter (Signed)
 Advised that in provider note it said she could stop the metformin  and start on Mounjaro    Copied from CRM [id number]. Topic: Clinical - Medical Advice >> May 10, 2024  3:53 PM Ivette P wrote: Reason for CRM: PT called in about medication tirzepatide  (MOUNJARO ) 2.5 MG/0.5ML Pen  and patient would like for someone to call her and give her advice on when she should stop taking metformin  and when she can start doing the the mounjaro  injections.   Pt callback [phone]

## 2024-05-24 ENCOUNTER — Other Ambulatory Visit: Payer: Self-pay | Admitting: Family Medicine

## 2024-05-30 ENCOUNTER — Encounter: Payer: Self-pay | Admitting: Family Medicine

## 2024-05-31 NOTE — Telephone Encounter (Signed)
 Forms printed and placed in provider's box to review and sign.

## 2024-06-07 ENCOUNTER — Telehealth: Admitting: Family Medicine

## 2024-06-08 ENCOUNTER — Other Ambulatory Visit: Payer: Self-pay | Admitting: Family Medicine

## 2024-06-08 DIAGNOSIS — I10 Essential (primary) hypertension: Secondary | ICD-10-CM

## 2024-06-13 DIAGNOSIS — Z0279 Encounter for issue of other medical certificate: Secondary | ICD-10-CM

## 2024-06-14 ENCOUNTER — Telehealth: Payer: Self-pay | Admitting: Family Medicine

## 2024-06-14 NOTE — Telephone Encounter (Signed)
 These were completed and put up front. Can you let patient know when these are submitted.

## 2024-06-14 NOTE — Telephone Encounter (Signed)
 Spoke to patient to update her that FMLA paperwork has been faxed as of 06/14/24 @ 1:40pm

## 2024-06-19 ENCOUNTER — Ambulatory Visit
Admission: EM | Admit: 2024-06-19 | Discharge: 2024-06-19 | Disposition: A | Discharge status: Home / Self Care | Attending: Physician Assistant | Admitting: Physician Assistant

## 2024-06-19 ENCOUNTER — Encounter: Payer: Self-pay | Admitting: Emergency Medicine

## 2024-06-19 DIAGNOSIS — R519 Headache, unspecified: Secondary | ICD-10-CM | POA: Diagnosis not present

## 2024-06-19 MED ORDER — KETOROLAC TROMETHAMINE 60 MG/2ML IM SOLN
30.0000 mg | Freq: Once | INTRAMUSCULAR | Status: AC
  Administered 2024-06-19: 30 mg via INTRAMUSCULAR

## 2024-06-19 NOTE — ED Triage Notes (Signed)
 Patient states that she was walking around the house yesterday around 8pm.  Patient states that she developed a sharp sudden pain on the right top side of her head.  Patient states that she took Tylenol  and went to bed.  Patient states that when she woke up this morning she did not feel any better.  Patient states that the pain in is now all around her head.  Patient denies any N/V.  Patient denies any numbness or tingling.
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
## 2024-06-19 NOTE — Discharge Instructions (Addendum)
-   Vitals are all normal today and your exam is normal. - Low suspicion for stroke, aneurysm or serious condition causing your headache.  Likely due to a tension type headache. - You can continue Tylenol  at home. - We gave you Toradol  injection in the clinic. - If you experience any vision changes, worsening of your headache, sharp and stabbing pain on the side of your head like you experienced last night, numbness/tingling, weakness of extremities, confusion or brain fog, dizziness, vomiting, severe neck pain, fever, chest pain, shortness of breath, call 911 or have someone take immediately to the ER.

## 2024-06-27 ENCOUNTER — Telehealth: Payer: Self-pay | Admitting: Family Medicine

## 2024-06-27 NOTE — Telephone Encounter (Signed)
 Called and left message that provider is out 7/11 and appt is cancelled. If patient calls back ok to reschedule

## 2024-07-08 ENCOUNTER — Encounter: Payer: Self-pay | Admitting: Family Medicine

## 2024-07-08 ENCOUNTER — Other Ambulatory Visit: Payer: Self-pay | Admitting: Family Medicine

## 2024-07-08 DIAGNOSIS — Z1231 Encounter for screening mammogram for malignant neoplasm of breast: Secondary | ICD-10-CM

## 2024-07-29 ENCOUNTER — Ambulatory Visit
Admission: RE | Admit: 2024-07-29 | Discharge: 2024-07-29 | Disposition: A | Discharge status: Home / Self Care | Source: Ambulatory Visit | Attending: Family Medicine | Admitting: Family Medicine

## 2024-07-29 DIAGNOSIS — Z1231 Encounter for screening mammogram for malignant neoplasm of breast: Secondary | ICD-10-CM | POA: Diagnosis present

## 2024-08-03 ENCOUNTER — Ambulatory Visit: Payer: Self-pay | Admitting: Family Medicine

## 2024-08-10 LAB — LIPID PANEL
Cholesterol: 151 (ref 0–200)
HDL: 52 (ref 35–70)
LDL Cholesterol: 75
Triglycerides: 137 (ref 40–160)

## 2024-08-10 LAB — HEMOGLOBIN A1C: Hemoglobin A1C: 6.6

## 2024-08-10 LAB — BASIC METABOLIC PANEL WITH GFR: Glucose: 118

## 2024-08-15 ENCOUNTER — Encounter: Admitting: Family Medicine

## 2024-08-17 ENCOUNTER — Encounter: Payer: Self-pay | Admitting: Family Medicine

## 2024-08-17 NOTE — Telephone Encounter (Signed)
 Please see the message below.

## 2024-08-18 NOTE — Telephone Encounter (Signed)
 Results has been updated in the pt chart

## 2024-08-18 NOTE — Telephone Encounter (Signed)
 The results ought to be in the [facility] portal through [facility] for us  to abstract

## 2024-09-20 ENCOUNTER — Other Ambulatory Visit: Payer: Self-pay | Admitting: Family Medicine

## 2024-09-22 ENCOUNTER — Encounter: Payer: Self-pay | Admitting: Family Medicine

## 2024-09-22 ENCOUNTER — Ambulatory Visit (INDEPENDENT_AMBULATORY_CARE_PROVIDER_SITE_OTHER): Admitting: Family Medicine

## 2024-09-22 VITALS — BP 112/76 | HR 76 | Ht 65.0 in | Wt 247.9 lb

## 2024-09-22 DIAGNOSIS — Z0000 Encounter for general adult medical examination without abnormal findings: Secondary | ICD-10-CM

## 2024-09-22 DIAGNOSIS — E1169 Type 2 diabetes mellitus with other specified complication: Secondary | ICD-10-CM

## 2024-09-22 DIAGNOSIS — F418 Other specified anxiety disorders: Secondary | ICD-10-CM | POA: Diagnosis not present

## 2024-09-22 DIAGNOSIS — E1159 Type 2 diabetes mellitus with other circulatory complications: Secondary | ICD-10-CM

## 2024-09-22 DIAGNOSIS — I152 Hypertension secondary to endocrine disorders: Secondary | ICD-10-CM

## 2024-09-22 DIAGNOSIS — Z0001 Encounter for general adult medical examination with abnormal findings: Secondary | ICD-10-CM

## 2024-09-22 DIAGNOSIS — Z23 Encounter for immunization: Secondary | ICD-10-CM

## 2024-09-22 MED ORDER — HYDROXYZINE HCL 10 MG PO TABS: 10.0000 mg | ORAL_TABLET | Freq: Three times a day (TID) | ORAL | 0 refills | Status: AC | PRN

## 2024-09-22 NOTE — Assessment & Plan Note (Signed)
 Obesity is being managed with Mounjaro . She has lost weight and inches since June. Current management is effective. - Continue current dose of Mounjaro 

## 2024-09-22 NOTE — Assessment & Plan Note (Signed)
 Type 2 diabetes mellitus, well-controlled with current medication regimen. Last A1c was 6.6%. She has lost weight and is managing well on current dose of Mounjaro . Discussed potential increase in dosage but decided to maintain current dose as A1c is controlled and weight loss is ongoing. - Continue current dose of Mounjaro  - Perform urine test for diabetes - Perform foot exam - Schedule eye exam for next month

## 2024-09-22 NOTE — Assessment & Plan Note (Signed)
 Well controlled. Continue current medications
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
## 2024-09-23 LAB — MICROALBUMIN / CREATININE URINE RATIO
Creatinine, Urine: 29.1 mg/dL
Microalb/Creat Ratio: <10 mg/g{creat} (ref 0–29)
Microalbumin, Urine: <3 ug/mL

## 2024-09-23 LAB — SPECIMEN STATUS REPORT

## 2024-09-23 NOTE — Addendum Note (Signed)
 Addended by: LILIAN SEVERO RAMAN on: 09/23/2024 08:54 AM   Modules accepted: Orders

## 2024-09-26 ENCOUNTER — Ambulatory Visit: Payer: Self-pay | Admitting: Family Medicine

## 2024-09-26 ENCOUNTER — Encounter: Payer: Self-pay | Admitting: Family Medicine

## 2024-10-27 ENCOUNTER — Encounter: Payer: Self-pay | Admitting: Family Medicine

## 2024-10-31 NOTE — Telephone Encounter (Signed)
 Per patient's request FMLA has been faxed.

## 2024-11-21 ENCOUNTER — Encounter: Payer: Self-pay | Admitting: Family Medicine

## 2024-11-21 LAB — OPHTHALMOLOGY REPORT-SCANNED

## 2025-01-11 ENCOUNTER — Encounter: Payer: Self-pay | Admitting: Family Medicine

## 2025-01-11 DIAGNOSIS — E1169 Type 2 diabetes mellitus with other specified complication: Secondary | ICD-10-CM

## 2025-01-12 MED ORDER — TIRZEPATIDE 7.5 MG/0.5ML ~~LOC~~ SOAJ: 7.5000 mg | SUBCUTANEOUS | 2 refills | Status: AC

## 2025-01-12 NOTE — Telephone Encounter (Signed)
 Ok to increase her mounjaro  to 7.5 mg weekly (send 1 month supply with 2 refills).

## 2025-03-23 ENCOUNTER — Ambulatory Visit: Admitting: Family Medicine
